# Patient Record
Sex: Female | Born: 1935 | Race: White | Hispanic: No | Smoking: Never smoker
Health system: Southern US, Community
[De-identification: ages and names within clinical notes are randomized; demographics above are authoritative.]

## PROBLEM LIST (undated history)

## (undated) DIAGNOSIS — K8071 Calculus of gallbladder and bile duct without cholecystitis with obstruction: Secondary | ICD-10-CM

## (undated) DIAGNOSIS — I509 Heart failure, unspecified: Secondary | ICD-10-CM

## (undated) DIAGNOSIS — Z9851 Tubal ligation status: Secondary | ICD-10-CM

## (undated) DIAGNOSIS — Z9049 Acquired absence of other specified parts of digestive tract: Secondary | ICD-10-CM

## (undated) DIAGNOSIS — J45909 Unspecified asthma, uncomplicated: Secondary | ICD-10-CM

## (undated) DIAGNOSIS — E162 Hypoglycemia, unspecified: Secondary | ICD-10-CM

## (undated) HISTORY — DX: Hypoglycemia, unspecified: E16.2

## (undated) HISTORY — PX: APPENDECTOMY: SHX54

## (undated) HISTORY — DX: Calculus of gallbladder and bile duct without cholecystitis with obstruction: K80.71

## (undated) HISTORY — PX: ABDOMINAL HYSTERECTOMY: SHX81

## (undated) HISTORY — PX: TUBAL LIGATION: SHX77

## (undated) HISTORY — DX: Heart failure, unspecified: I50.9

---

## 2006-06-09 ENCOUNTER — Emergency Department: Payer: Self-pay | Admitting: Internal Medicine

## 2006-06-09 ENCOUNTER — Other Ambulatory Visit: Payer: Self-pay

## 2006-06-10 ENCOUNTER — Ambulatory Visit: Payer: Self-pay | Admitting: Internal Medicine

## 2006-06-30 ENCOUNTER — Emergency Department: Payer: Self-pay | Admitting: Emergency Medicine

## 2006-06-30 ENCOUNTER — Other Ambulatory Visit: Payer: Self-pay

## 2010-05-24 ENCOUNTER — Emergency Department: Payer: Self-pay | Admitting: Emergency Medicine

## 2010-07-10 ENCOUNTER — Ambulatory Visit: Payer: Self-pay | Admitting: Internal Medicine

## 2010-11-23 ENCOUNTER — Emergency Department: Payer: Self-pay | Admitting: *Deleted

## 2010-12-15 ENCOUNTER — Ambulatory Visit: Payer: Self-pay | Admitting: Internal Medicine

## 2011-09-01 ENCOUNTER — Ambulatory Visit: Payer: Self-pay | Admitting: Family Medicine

## 2011-09-01 DIAGNOSIS — J069 Acute upper respiratory infection, unspecified: Secondary | ICD-10-CM | POA: Diagnosis not present

## 2011-09-01 DIAGNOSIS — R059 Cough, unspecified: Secondary | ICD-10-CM | POA: Diagnosis not present

## 2011-09-01 DIAGNOSIS — R05 Cough: Secondary | ICD-10-CM | POA: Diagnosis not present

## 2011-11-22 ENCOUNTER — Ambulatory Visit: Payer: Self-pay | Admitting: Internal Medicine

## 2011-11-22 DIAGNOSIS — J45909 Unspecified asthma, uncomplicated: Secondary | ICD-10-CM | POA: Diagnosis not present

## 2011-11-22 DIAGNOSIS — J40 Bronchitis, not specified as acute or chronic: Secondary | ICD-10-CM | POA: Diagnosis not present

## 2011-11-22 LAB — RAPID STREP-A WITH REFLX: Micro Text Report: NEGATIVE

## 2011-12-24 DIAGNOSIS — Z23 Encounter for immunization: Secondary | ICD-10-CM | POA: Diagnosis not present

## 2012-03-06 ENCOUNTER — Ambulatory Visit: Payer: Self-pay | Admitting: Emergency Medicine

## 2012-03-06 DIAGNOSIS — R918 Other nonspecific abnormal finding of lung field: Secondary | ICD-10-CM | POA: Diagnosis not present

## 2012-03-06 DIAGNOSIS — J45909 Unspecified asthma, uncomplicated: Secondary | ICD-10-CM | POA: Diagnosis not present

## 2012-06-23 ENCOUNTER — Ambulatory Visit: Payer: Self-pay | Admitting: Emergency Medicine

## 2012-06-23 DIAGNOSIS — J40 Bronchitis, not specified as acute or chronic: Secondary | ICD-10-CM | POA: Diagnosis not present

## 2012-06-23 DIAGNOSIS — J069 Acute upper respiratory infection, unspecified: Secondary | ICD-10-CM | POA: Diagnosis not present

## 2012-09-29 ENCOUNTER — Ambulatory Visit: Payer: Self-pay | Admitting: Emergency Medicine

## 2012-09-29 DIAGNOSIS — R0989 Other specified symptoms and signs involving the circulatory and respiratory systems: Secondary | ICD-10-CM | POA: Diagnosis not present

## 2012-09-29 DIAGNOSIS — E162 Hypoglycemia, unspecified: Secondary | ICD-10-CM | POA: Diagnosis not present

## 2012-09-29 DIAGNOSIS — R059 Cough, unspecified: Secondary | ICD-10-CM | POA: Diagnosis not present

## 2012-09-29 DIAGNOSIS — J40 Bronchitis, not specified as acute or chronic: Secondary | ICD-10-CM | POA: Diagnosis not present

## 2012-09-29 DIAGNOSIS — R05 Cough: Secondary | ICD-10-CM | POA: Diagnosis not present

## 2012-09-29 DIAGNOSIS — J45909 Unspecified asthma, uncomplicated: Secondary | ICD-10-CM | POA: Diagnosis not present

## 2012-10-13 DIAGNOSIS — N289 Disorder of kidney and ureter, unspecified: Secondary | ICD-10-CM | POA: Diagnosis not present

## 2012-10-13 DIAGNOSIS — J45909 Unspecified asthma, uncomplicated: Secondary | ICD-10-CM | POA: Diagnosis not present

## 2012-11-22 DIAGNOSIS — R0789 Other chest pain: Secondary | ICD-10-CM | POA: Diagnosis not present

## 2012-11-22 DIAGNOSIS — N289 Disorder of kidney and ureter, unspecified: Secondary | ICD-10-CM | POA: Diagnosis not present

## 2012-11-22 DIAGNOSIS — J45909 Unspecified asthma, uncomplicated: Secondary | ICD-10-CM | POA: Diagnosis not present

## 2012-11-22 DIAGNOSIS — Z0000 Encounter for general adult medical examination without abnormal findings: Secondary | ICD-10-CM | POA: Diagnosis not present

## 2012-11-22 DIAGNOSIS — E785 Hyperlipidemia, unspecified: Secondary | ICD-10-CM | POA: Diagnosis not present

## 2012-12-28 DIAGNOSIS — L821 Other seborrheic keratosis: Secondary | ICD-10-CM | POA: Diagnosis not present

## 2012-12-28 DIAGNOSIS — L570 Actinic keratosis: Secondary | ICD-10-CM | POA: Diagnosis not present

## 2012-12-28 DIAGNOSIS — D1801 Hemangioma of skin and subcutaneous tissue: Secondary | ICD-10-CM | POA: Diagnosis not present

## 2013-01-03 DIAGNOSIS — Z23 Encounter for immunization: Secondary | ICD-10-CM | POA: Diagnosis not present

## 2013-03-11 ENCOUNTER — Ambulatory Visit: Payer: Self-pay | Admitting: Physician Assistant

## 2013-03-11 DIAGNOSIS — R059 Cough, unspecified: Secondary | ICD-10-CM | POA: Diagnosis not present

## 2013-03-11 DIAGNOSIS — J40 Bronchitis, not specified as acute or chronic: Secondary | ICD-10-CM | POA: Diagnosis not present

## 2013-03-11 DIAGNOSIS — J45909 Unspecified asthma, uncomplicated: Secondary | ICD-10-CM | POA: Diagnosis not present

## 2013-03-11 DIAGNOSIS — Z90710 Acquired absence of both cervix and uterus: Secondary | ICD-10-CM | POA: Diagnosis not present

## 2013-03-11 DIAGNOSIS — Z9851 Tubal ligation status: Secondary | ICD-10-CM | POA: Diagnosis not present

## 2013-03-11 DIAGNOSIS — R05 Cough: Secondary | ICD-10-CM | POA: Diagnosis not present

## 2013-03-11 DIAGNOSIS — E162 Hypoglycemia, unspecified: Secondary | ICD-10-CM | POA: Diagnosis not present

## 2013-03-11 LAB — RAPID INFLUENZA A&B ANTIGENS (ARMC ONLY)

## 2013-03-22 ENCOUNTER — Ambulatory Visit: Payer: Self-pay | Admitting: Physician Assistant

## 2013-03-22 DIAGNOSIS — R059 Cough, unspecified: Secondary | ICD-10-CM | POA: Diagnosis not present

## 2013-03-22 DIAGNOSIS — F0390 Unspecified dementia without behavioral disturbance: Secondary | ICD-10-CM | POA: Diagnosis not present

## 2013-03-22 DIAGNOSIS — Z9851 Tubal ligation status: Secondary | ICD-10-CM | POA: Diagnosis not present

## 2013-03-22 DIAGNOSIS — R05 Cough: Secondary | ICD-10-CM | POA: Diagnosis not present

## 2013-03-22 DIAGNOSIS — Z79899 Other long term (current) drug therapy: Secondary | ICD-10-CM | POA: Diagnosis not present

## 2013-03-22 DIAGNOSIS — J069 Acute upper respiratory infection, unspecified: Secondary | ICD-10-CM | POA: Diagnosis not present

## 2013-03-22 DIAGNOSIS — Z90710 Acquired absence of both cervix and uterus: Secondary | ICD-10-CM | POA: Diagnosis not present

## 2013-03-22 DIAGNOSIS — E162 Hypoglycemia, unspecified: Secondary | ICD-10-CM | POA: Diagnosis not present

## 2013-03-22 DIAGNOSIS — J45909 Unspecified asthma, uncomplicated: Secondary | ICD-10-CM | POA: Diagnosis not present

## 2013-06-21 DIAGNOSIS — R300 Dysuria: Secondary | ICD-10-CM | POA: Diagnosis not present

## 2013-06-21 DIAGNOSIS — R42 Dizziness and giddiness: Secondary | ICD-10-CM | POA: Diagnosis not present

## 2013-07-04 DIAGNOSIS — J209 Acute bronchitis, unspecified: Secondary | ICD-10-CM | POA: Diagnosis not present

## 2013-10-18 ENCOUNTER — Ambulatory Visit: Payer: Self-pay | Admitting: Physician Assistant

## 2013-10-18 DIAGNOSIS — R05 Cough: Secondary | ICD-10-CM | POA: Diagnosis not present

## 2013-10-18 DIAGNOSIS — R059 Cough, unspecified: Secondary | ICD-10-CM | POA: Diagnosis not present

## 2013-10-18 DIAGNOSIS — J45909 Unspecified asthma, uncomplicated: Secondary | ICD-10-CM | POA: Diagnosis not present

## 2013-10-18 LAB — RAPID STREP-A WITH REFLX: Micro Text Report: NEGATIVE

## 2013-10-20 LAB — BETA STREP CULTURE(ARMC)

## 2013-10-22 DIAGNOSIS — J45909 Unspecified asthma, uncomplicated: Secondary | ICD-10-CM | POA: Diagnosis not present

## 2013-10-22 DIAGNOSIS — J40 Bronchitis, not specified as acute or chronic: Secondary | ICD-10-CM | POA: Diagnosis not present

## 2013-11-26 DIAGNOSIS — N3000 Acute cystitis without hematuria: Secondary | ICD-10-CM | POA: Diagnosis not present

## 2013-11-26 DIAGNOSIS — R05 Cough: Secondary | ICD-10-CM | POA: Diagnosis not present

## 2013-11-26 DIAGNOSIS — R059 Cough, unspecified: Secondary | ICD-10-CM | POA: Diagnosis not present

## 2013-12-06 ENCOUNTER — Emergency Department: Payer: Self-pay | Admitting: Emergency Medicine

## 2013-12-06 DIAGNOSIS — R0602 Shortness of breath: Secondary | ICD-10-CM | POA: Diagnosis not present

## 2013-12-06 DIAGNOSIS — R918 Other nonspecific abnormal finding of lung field: Secondary | ICD-10-CM | POA: Diagnosis not present

## 2013-12-06 DIAGNOSIS — R05 Cough: Secondary | ICD-10-CM | POA: Diagnosis not present

## 2013-12-06 DIAGNOSIS — J209 Acute bronchitis, unspecified: Secondary | ICD-10-CM | POA: Diagnosis not present

## 2013-12-06 LAB — CBC
HCT: 46.9 % (ref 35.0–47.0)
HGB: 15.4 g/dL (ref 12.0–16.0)
MCH: 30.2 pg (ref 26.0–34.0)
MCHC: 32.9 g/dL (ref 32.0–36.0)
MCV: 92 fL (ref 80–100)
Platelet: 217 10*3/uL (ref 150–440)
RBC: 5.1 10*6/uL (ref 3.80–5.20)
RDW: 13 % (ref 11.5–14.5)
WBC: 8.1 10*3/uL (ref 3.6–11.0)

## 2013-12-06 LAB — BASIC METABOLIC PANEL
Anion Gap: 8 (ref 7–16)
BUN: 16 mg/dL (ref 7–18)
CALCIUM: 9.1 mg/dL (ref 8.5–10.1)
CHLORIDE: 103 mmol/L (ref 98–107)
Co2: 27 mmol/L (ref 21–32)
Creatinine: 0.73 mg/dL (ref 0.60–1.30)
EGFR (African American): >60
Glucose: 101 mg/dL — ABNORMAL HIGH (ref 65–99)
OSMOLALITY: 277 (ref 275–301)
Potassium: 3.7 mmol/L (ref 3.5–5.1)
SODIUM: 138 mmol/L (ref 136–145)

## 2013-12-06 LAB — TROPONIN I: Troponin-I: <0.02 ng/mL

## 2013-12-06 LAB — PRO B NATRIURETIC PEPTIDE: B-TYPE NATIURETIC PEPTID: 431 pg/mL (ref 0–450)

## 2013-12-08 ENCOUNTER — Ambulatory Visit: Payer: Self-pay | Admitting: Internal Medicine

## 2013-12-08 DIAGNOSIS — J441 Chronic obstructive pulmonary disease with (acute) exacerbation: Secondary | ICD-10-CM | POA: Diagnosis not present

## 2013-12-08 DIAGNOSIS — Z79899 Other long term (current) drug therapy: Secondary | ICD-10-CM | POA: Diagnosis not present

## 2013-12-08 DIAGNOSIS — J45901 Unspecified asthma with (acute) exacerbation: Secondary | ICD-10-CM | POA: Diagnosis not present

## 2013-12-08 DIAGNOSIS — J439 Emphysema, unspecified: Secondary | ICD-10-CM | POA: Diagnosis not present

## 2013-12-10 ENCOUNTER — Inpatient Hospital Stay: Payer: Self-pay | Admitting: Internal Medicine

## 2013-12-10 DIAGNOSIS — J4521 Mild intermittent asthma with (acute) exacerbation: Secondary | ICD-10-CM | POA: Diagnosis not present

## 2013-12-10 DIAGNOSIS — Z8673 Personal history of transient ischemic attack (TIA), and cerebral infarction without residual deficits: Secondary | ICD-10-CM | POA: Diagnosis not present

## 2013-12-10 DIAGNOSIS — R918 Other nonspecific abnormal finding of lung field: Secondary | ICD-10-CM | POA: Diagnosis not present

## 2013-12-10 DIAGNOSIS — I517 Cardiomegaly: Secondary | ICD-10-CM | POA: Diagnosis not present

## 2013-12-10 DIAGNOSIS — R05 Cough: Secondary | ICD-10-CM | POA: Diagnosis not present

## 2013-12-10 DIAGNOSIS — Z888 Allergy status to other drugs, medicaments and biological substances status: Secondary | ICD-10-CM | POA: Diagnosis not present

## 2013-12-10 DIAGNOSIS — J189 Pneumonia, unspecified organism: Secondary | ICD-10-CM | POA: Diagnosis not present

## 2013-12-10 DIAGNOSIS — J441 Chronic obstructive pulmonary disease with (acute) exacerbation: Secondary | ICD-10-CM | POA: Diagnosis not present

## 2013-12-10 DIAGNOSIS — J440 Chronic obstructive pulmonary disease with acute lower respiratory infection: Secondary | ICD-10-CM | POA: Diagnosis present

## 2013-12-10 DIAGNOSIS — R0602 Shortness of breath: Secondary | ICD-10-CM | POA: Diagnosis not present

## 2013-12-10 DIAGNOSIS — J9601 Acute respiratory failure with hypoxia: Secondary | ICD-10-CM | POA: Diagnosis not present

## 2013-12-10 DIAGNOSIS — J45909 Unspecified asthma, uncomplicated: Secondary | ICD-10-CM | POA: Diagnosis present

## 2013-12-10 DIAGNOSIS — J9600 Acute respiratory failure, unspecified whether with hypoxia or hypercapnia: Secondary | ICD-10-CM | POA: Diagnosis not present

## 2013-12-10 DIAGNOSIS — M6281 Muscle weakness (generalized): Secondary | ICD-10-CM | POA: Diagnosis not present

## 2013-12-10 DIAGNOSIS — T380X5A Adverse effect of glucocorticoids and synthetic analogues, initial encounter: Secondary | ICD-10-CM | POA: Diagnosis present

## 2013-12-10 LAB — CBC WITH DIFFERENTIAL/PLATELET
Basophil #: 0.1 10*3/uL (ref 0.0–0.1)
Basophil %: 1.1 %
EOS ABS: 0.2 10*3/uL (ref 0.0–0.7)
Eosinophil %: 2.8 %
HCT: 44.1 % (ref 35.0–47.0)
HGB: 14.9 g/dL (ref 12.0–16.0)
LYMPHS ABS: 2.4 10*3/uL (ref 1.0–3.6)
Lymphocyte %: 41.8 %
MCH: 30.8 pg (ref 26.0–34.0)
MCHC: 33.8 g/dL (ref 32.0–36.0)
MCV: 91 fL (ref 80–100)
MONO ABS: 0.5 x10 3/mm (ref 0.2–0.9)
MONOS PCT: 8.8 %
NEUTROS ABS: 2.6 10*3/uL (ref 1.4–6.5)
Neutrophil %: 45.5 %
Platelet: 182 10*3/uL (ref 150–440)
RBC: 4.84 10*6/uL (ref 3.80–5.20)
RDW: 12.8 % (ref 11.5–14.5)
WBC: 5.8 10*3/uL (ref 3.6–11.0)

## 2013-12-10 LAB — URINALYSIS, COMPLETE
BACTERIA: NONE SEEN
BILIRUBIN, UR: NEGATIVE
BLOOD: NEGATIVE
GLUCOSE, UR: NEGATIVE mg/dL (ref 0–75)
Ketone: NEGATIVE
Leukocyte Esterase: NEGATIVE
Nitrite: NEGATIVE
Ph: 7 (ref 4.5–8.0)
Protein: NEGATIVE
RBC,UR: <1 /HPF (ref 0–5)
SPECIFIC GRAVITY: 1.006 (ref 1.003–1.030)
Squamous Epithelial: <1

## 2013-12-10 LAB — BASIC METABOLIC PANEL
Anion Gap: 3 — ABNORMAL LOW (ref 7–16)
BUN: 15 mg/dL (ref 7–18)
CALCIUM: 8.9 mg/dL (ref 8.5–10.1)
Chloride: 104 mmol/L (ref 98–107)
Co2: 33 mmol/L — ABNORMAL HIGH (ref 21–32)
Creatinine: 0.76 mg/dL (ref 0.60–1.30)
EGFR (African American): >60
EGFR (Non-African Amer.): >60
GLUCOSE: 97 mg/dL (ref 65–99)
Osmolality: 280 (ref 275–301)
Potassium: 3.7 mmol/L (ref 3.5–5.1)
Sodium: 140 mmol/L (ref 136–145)

## 2013-12-10 LAB — HEPATIC FUNCTION PANEL A (ARMC)
ALBUMIN: 3.7 g/dL (ref 3.4–5.0)
Alkaline Phosphatase: 70 U/L
BILIRUBIN DIRECT: 0.2 mg/dL (ref 0.00–0.20)
Bilirubin,Total: 0.6 mg/dL (ref 0.2–1.0)
SGOT(AST): 29 U/L (ref 15–37)
SGPT (ALT): 30 U/L
Total Protein: 7.2 g/dL (ref 6.4–8.2)

## 2013-12-11 DIAGNOSIS — J9601 Acute respiratory failure with hypoxia: Secondary | ICD-10-CM | POA: Diagnosis not present

## 2013-12-11 DIAGNOSIS — J189 Pneumonia, unspecified organism: Secondary | ICD-10-CM | POA: Diagnosis not present

## 2013-12-11 DIAGNOSIS — J4521 Mild intermittent asthma with (acute) exacerbation: Secondary | ICD-10-CM | POA: Diagnosis not present

## 2013-12-11 LAB — CBC WITH DIFFERENTIAL/PLATELET
Basophil #: 0 10*3/uL (ref 0.0–0.1)
Basophil %: 0.5 %
EOS PCT: 1.4 %
Eosinophil #: 0.1 10*3/uL (ref 0.0–0.7)
HCT: 41.5 % (ref 35.0–47.0)
HGB: 13.6 g/dL (ref 12.0–16.0)
LYMPHS ABS: 3.1 10*3/uL (ref 1.0–3.6)
LYMPHS PCT: 47 %
MCH: 30.2 pg (ref 26.0–34.0)
MCHC: 32.7 g/dL (ref 32.0–36.0)
MCV: 92 fL (ref 80–100)
MONO ABS: 0.6 x10 3/mm (ref 0.2–0.9)
Monocyte %: 9.1 %
NEUTROS PCT: 42 %
Neutrophil #: 2.8 10*3/uL (ref 1.4–6.5)
Platelet: 178 10*3/uL (ref 150–440)
RBC: 4.49 10*6/uL (ref 3.80–5.20)
RDW: 12.8 % (ref 11.5–14.5)
WBC: 6.6 10*3/uL (ref 3.6–11.0)

## 2013-12-11 LAB — COMPREHENSIVE METABOLIC PANEL
ALBUMIN: 3.4 g/dL (ref 3.4–5.0)
ANION GAP: 5 — AB (ref 7–16)
AST: 18 U/L (ref 15–37)
Alkaline Phosphatase: 61 U/L
BUN: 18 mg/dL (ref 7–18)
Bilirubin,Total: 0.7 mg/dL (ref 0.2–1.0)
CREATININE: 0.91 mg/dL (ref 0.60–1.30)
Calcium, Total: 8.2 mg/dL — ABNORMAL LOW (ref 8.5–10.1)
Chloride: 102 mmol/L (ref 98–107)
Co2: 33 mmol/L — ABNORMAL HIGH (ref 21–32)
Glucose: 87 mg/dL (ref 65–99)
OSMOLALITY: 281 (ref 275–301)
POTASSIUM: 3.7 mmol/L (ref 3.5–5.1)
SGPT (ALT): 24 U/L
SODIUM: 140 mmol/L (ref 136–145)
TOTAL PROTEIN: 6.6 g/dL (ref 6.4–8.2)

## 2013-12-12 DIAGNOSIS — J9601 Acute respiratory failure with hypoxia: Secondary | ICD-10-CM | POA: Diagnosis not present

## 2013-12-12 DIAGNOSIS — J45909 Unspecified asthma, uncomplicated: Secondary | ICD-10-CM | POA: Diagnosis not present

## 2013-12-12 DIAGNOSIS — Z888 Allergy status to other drugs, medicaments and biological substances status: Secondary | ICD-10-CM | POA: Diagnosis not present

## 2013-12-12 DIAGNOSIS — Z8673 Personal history of transient ischemic attack (TIA), and cerebral infarction without residual deficits: Secondary | ICD-10-CM | POA: Diagnosis not present

## 2013-12-12 DIAGNOSIS — J189 Pneumonia, unspecified organism: Secondary | ICD-10-CM | POA: Diagnosis not present

## 2013-12-12 DIAGNOSIS — R0902 Hypoxemia: Secondary | ICD-10-CM | POA: Diagnosis not present

## 2013-12-12 DIAGNOSIS — Z8701 Personal history of pneumonia (recurrent): Secondary | ICD-10-CM | POA: Diagnosis not present

## 2013-12-12 DIAGNOSIS — Z801 Family history of malignant neoplasm of trachea, bronchus and lung: Secondary | ICD-10-CM | POA: Diagnosis not present

## 2013-12-12 LAB — CBC
HCT: 46.2 % (ref 35.0–47.0)
HGB: 15.3 g/dL (ref 12.0–16.0)
MCH: 30.8 pg (ref 26.0–34.0)
MCHC: 33.2 g/dL (ref 32.0–36.0)
MCV: 93 fL (ref 80–100)
Platelet: 185 10*3/uL (ref 150–440)
RBC: 4.98 10*6/uL (ref 3.80–5.20)
RDW: 13 % (ref 11.5–14.5)
WBC: 6.8 10*3/uL (ref 3.6–11.0)

## 2013-12-12 LAB — COMPREHENSIVE METABOLIC PANEL
ALBUMIN: 3.7 g/dL (ref 3.4–5.0)
ALK PHOS: 69 U/L
AST: 20 U/L (ref 15–37)
Anion Gap: 5 — ABNORMAL LOW (ref 7–16)
BUN: 17 mg/dL (ref 7–18)
Bilirubin,Total: 0.6 mg/dL (ref 0.2–1.0)
CHLORIDE: 104 mmol/L (ref 98–107)
CO2: 32 mmol/L (ref 21–32)
Calcium, Total: 9.1 mg/dL (ref 8.5–10.1)
Creatinine: 0.81 mg/dL (ref 0.60–1.30)
EGFR (African American): >60
EGFR (Non-African Amer.): >60
Glucose: 109 mg/dL — ABNORMAL HIGH (ref 65–99)
Osmolality: 283 (ref 275–301)
POTASSIUM: 3.6 mmol/L (ref 3.5–5.1)
SGPT (ALT): 25 U/L
Sodium: 141 mmol/L (ref 136–145)
Total Protein: 7.5 g/dL (ref 6.4–8.2)

## 2013-12-12 LAB — TROPONIN I: Troponin-I: <0.02 ng/mL

## 2013-12-12 LAB — URINE CULTURE

## 2013-12-13 ENCOUNTER — Observation Stay: Payer: Self-pay | Admitting: Internal Medicine

## 2013-12-13 DIAGNOSIS — J9600 Acute respiratory failure, unspecified whether with hypoxia or hypercapnia: Secondary | ICD-10-CM | POA: Diagnosis not present

## 2013-12-13 DIAGNOSIS — J189 Pneumonia, unspecified organism: Secondary | ICD-10-CM | POA: Diagnosis not present

## 2013-12-13 DIAGNOSIS — J45901 Unspecified asthma with (acute) exacerbation: Secondary | ICD-10-CM | POA: Diagnosis not present

## 2013-12-13 DIAGNOSIS — J45990 Exercise induced bronchospasm: Secondary | ICD-10-CM | POA: Diagnosis not present

## 2013-12-15 LAB — CULTURE, BLOOD (SINGLE)

## 2013-12-21 DIAGNOSIS — N289 Disorder of kidney and ureter, unspecified: Secondary | ICD-10-CM | POA: Diagnosis not present

## 2013-12-21 DIAGNOSIS — J189 Pneumonia, unspecified organism: Secondary | ICD-10-CM | POA: Diagnosis not present

## 2014-01-03 DIAGNOSIS — Z23 Encounter for immunization: Secondary | ICD-10-CM | POA: Diagnosis not present

## 2014-03-03 ENCOUNTER — Emergency Department: Payer: Self-pay | Admitting: Emergency Medicine

## 2014-03-03 DIAGNOSIS — J209 Acute bronchitis, unspecified: Secondary | ICD-10-CM | POA: Diagnosis not present

## 2014-03-03 DIAGNOSIS — J40 Bronchitis, not specified as acute or chronic: Secondary | ICD-10-CM | POA: Diagnosis not present

## 2014-03-03 DIAGNOSIS — R0602 Shortness of breath: Secondary | ICD-10-CM | POA: Diagnosis not present

## 2014-03-03 DIAGNOSIS — J441 Chronic obstructive pulmonary disease with (acute) exacerbation: Secondary | ICD-10-CM | POA: Diagnosis not present

## 2014-03-03 LAB — BASIC METABOLIC PANEL
ANION GAP: 6 — AB (ref 7–16)
BUN: 19 mg/dL — ABNORMAL HIGH (ref 7–18)
CHLORIDE: 105 mmol/L (ref 98–107)
Calcium, Total: 8.7 mg/dL (ref 8.5–10.1)
Co2: 30 mmol/L (ref 21–32)
Creatinine: 0.83 mg/dL (ref 0.60–1.30)
EGFR (African American): >60
EGFR (Non-African Amer.): >60
Glucose: 89 mg/dL (ref 65–99)
Osmolality: 283 (ref 275–301)
Potassium: 3.4 mmol/L — ABNORMAL LOW (ref 3.5–5.1)
Sodium: 141 mmol/L (ref 136–145)

## 2014-03-03 LAB — CBC WITH DIFFERENTIAL/PLATELET
Basophil #: 0 10*3/uL (ref 0.0–0.1)
Basophil %: 0.5 %
EOS ABS: 0.1 10*3/uL (ref 0.0–0.7)
EOS PCT: 1.4 %
HCT: 43.5 % (ref 35.0–47.0)
HGB: 14.4 g/dL (ref 12.0–16.0)
LYMPHS PCT: 39.3 %
Lymphocyte #: 2.7 10*3/uL (ref 1.0–3.6)
MCH: 30.7 pg (ref 26.0–34.0)
MCHC: 33.2 g/dL (ref 32.0–36.0)
MCV: 92 fL (ref 80–100)
Monocyte #: 0.7 x10 3/mm (ref 0.2–0.9)
Monocyte %: 10.5 %
Neutrophil #: 3.3 10*3/uL (ref 1.4–6.5)
Neutrophil %: 48.3 %
Platelet: 161 10*3/uL (ref 150–440)
RBC: 4.71 10*6/uL (ref 3.80–5.20)
RDW: 13.3 % (ref 11.5–14.5)
WBC: 6.8 10*3/uL (ref 3.6–11.0)

## 2014-03-03 LAB — TROPONIN I: Troponin-I: <0.02 ng/mL

## 2014-03-08 LAB — CULTURE, BLOOD (SINGLE)

## 2014-03-11 DIAGNOSIS — J209 Acute bronchitis, unspecified: Secondary | ICD-10-CM | POA: Diagnosis not present

## 2014-03-11 DIAGNOSIS — J4540 Moderate persistent asthma, uncomplicated: Secondary | ICD-10-CM | POA: Diagnosis not present

## 2014-05-13 DIAGNOSIS — N289 Disorder of kidney and ureter, unspecified: Secondary | ICD-10-CM | POA: Diagnosis not present

## 2014-05-13 DIAGNOSIS — Z111 Encounter for screening for respiratory tuberculosis: Secondary | ICD-10-CM | POA: Diagnosis not present

## 2014-05-13 DIAGNOSIS — J4520 Mild intermittent asthma, uncomplicated: Secondary | ICD-10-CM | POA: Diagnosis not present

## 2014-06-18 ENCOUNTER — Emergency Department
Admit: 2014-06-18 | Disposition: A | Discharge status: Home / Self Care | Payer: Self-pay | Admitting: Emergency Medicine

## 2014-06-18 DIAGNOSIS — R05 Cough: Secondary | ICD-10-CM | POA: Diagnosis not present

## 2014-06-18 DIAGNOSIS — R0602 Shortness of breath: Secondary | ICD-10-CM | POA: Diagnosis not present

## 2014-06-18 DIAGNOSIS — F419 Anxiety disorder, unspecified: Secondary | ICD-10-CM | POA: Diagnosis not present

## 2014-06-18 DIAGNOSIS — J441 Chronic obstructive pulmonary disease with (acute) exacerbation: Secondary | ICD-10-CM | POA: Diagnosis not present

## 2014-06-27 DIAGNOSIS — N289 Disorder of kidney and ureter, unspecified: Secondary | ICD-10-CM | POA: Diagnosis not present

## 2014-06-27 DIAGNOSIS — J4520 Mild intermittent asthma, uncomplicated: Secondary | ICD-10-CM | POA: Diagnosis not present

## 2014-06-27 DIAGNOSIS — I700 Atherosclerosis of aorta: Secondary | ICD-10-CM | POA: Diagnosis not present

## 2014-06-27 DIAGNOSIS — Z0000 Encounter for general adult medical examination without abnormal findings: Secondary | ICD-10-CM | POA: Diagnosis not present

## 2014-06-27 DIAGNOSIS — N63 Unspecified lump in breast: Secondary | ICD-10-CM | POA: Diagnosis not present

## 2014-06-27 DIAGNOSIS — J4530 Mild persistent asthma, uncomplicated: Secondary | ICD-10-CM | POA: Diagnosis not present

## 2014-06-27 LAB — LIPID PANEL
Cholesterol: 215 mg/dL — AB (ref 0–200)
HDL: 56 mg/dL (ref 35–70)
LDL Cholesterol: 139 mg/dL
Triglycerides: 100 mg/dL (ref 40–160)

## 2014-06-27 LAB — TSH: TSH: 0.82 u[IU]/mL (ref <=5.90)

## 2014-06-27 LAB — BASIC METABOLIC PANEL
BUN: 13 mg/dL (ref 4–21)
Creatinine: 0.7 mg/dL (ref <=1.1)

## 2014-06-27 LAB — CBC AND DIFFERENTIAL: Hemoglobin: 15.5 g/dL (ref 12.0–16.0)

## 2014-06-28 ENCOUNTER — Other Ambulatory Visit: Payer: Self-pay | Admitting: Internal Medicine

## 2014-06-28 DIAGNOSIS — R928 Other abnormal and inconclusive findings on diagnostic imaging of breast: Secondary | ICD-10-CM

## 2014-06-28 DIAGNOSIS — N63 Unspecified lump in unspecified breast: Secondary | ICD-10-CM

## 2014-06-29 NOTE — Discharge Summary (Signed)
PATIENT NAME:  Allison Snyder, Allison Snyder MR#:  [id number] DATE OF BIRTH:  June 05, 1929  DATE OF ADMISSION:  12/10/2013 DATE OF DISCHARGE:  12/11/2013  PRIMARY CARE PHYSICIAN:  Dr. Halina Maidens.   FINAL DIAGNOSES:  1.  Acute respiratory failure which resolved.  2.  Pneumonia.  3.  Reactive airway disease.   MEDICATIONS ON DISCHARGE: Include Levaquin 500 mg every 24 hours for 8 more days, Qvar 40 mcg per inhalation 1 puff twice a day, albuterol nebulizer 3 mL every 6 hours as needed for shortness of breath.   DIET:  Regular, regular consistency.   ACTIVITY: As tolerated.   FOLLOWUP:  In 1-2 weeks with Dr. Halina Maidens.   HOSPITAL COURSE: The patient was admitted 12/10/2013 and discharged 12/11/2013, came in with shortness of breath and cough, found to have a lower lobe pneumonia, was hypoxic on room air, pulse oximetry in the mid 80s. The patient was started on Levaquin and nebulizer treatments.   LABORATORY AND RADIOLOGICAL DATA DURING THE HOSPITAL COURSE: Included an EKG that showed normal sinus rhythm, right bundle branch. BNP 431. Glucose 101, BUN 16, creatinine 0.73, sodium 138, potassium 3.7, chloride 103, CO2 of 27. Troponin negative. White blood cell count 8.1, H and H 15.4 and 46.9, platelet count of 217,000. Chest x-ray showed hyperinflation of the lungs consistent with chronic obstructive pulmonary disease, stable bilateral basal subsegmental atelectasis and scarring is noted, those were done prior to admission. On the day of admission EKG showed sinus rhythm, premature atrial complexes, right bundle branch block. Liver function tests were normal. White blood cell count 5.8, H and H  14.9 and 44.1, platelet count of 182,000. Glucose 97, BUN 15, creatinine 0.76, sodium 140, potassium 3.7, chloride 104, CO2 of 33, calcium 8.9. Blood cultures were negative. Chest x-ray showed a left basilar airspace opacity, may reflect mild pneumonia, a small left pleural effusion noted, mild cardiomegaly. Urine  culture negative. Urinalysis negative. White blood cell count upon discharge 6.6, hemoglobin 13.6, platelet count 178,000. Creatinine 0.91.   HOSPITAL COURSE PER PROBLEM LIST:  1.  For the patient's acute respiratory failure, this had resolved. Initially requiring oxygen coming into the hospital, the patient was off the oxygen and saturating well above 90%.  2.  Pneumonia. The patient was started on Levaquin, will complete a full course of Levaquin.  3.  Reactive airway disease. The patient has an allergy to prednisone, thinks she had a stroke from prednisone in the past. I convinced her to do budesonide nebulizers while here because she had a lot of wheezing on October 5. On October 6 there was no wheezing after budesonide nebulizers were given. She was given Qvar as an outpatient. As per family has the same situation every year around the change in season, Qvar will help prevent that, if she does take it before the weather starts changing hopefully we can avoid bronchitis and hospitalization.   TIME SPENT ON DISCHARGE: 35 minutes.    ____________________________ Tana Conch. Leslye Peer, MD rjw:bu D: 12/11/2013 13:44:23 ET T: 12/11/2013 14:05:16 ET JOB#: [id number]  cc: Tana Conch. Leslye Peer, MD, <Dictator> Halina Maidens, MD Marisue Brooklyn MD ELECTRONICALLY SIGNED 12/23/2013 12:48

## 2014-06-29 NOTE — H&P (Signed)
PATIENT NAME:  Allison Snyder, Allison Snyder MR#:  [id number] DATE OF BIRTH:  1929-10-27  DATE OF ADMISSION:  12/10/2013  PRIMARY CARE PHYSICIAN: Halina Maidens, MD  REFERRING PHYSICIAN: Dr. Dahlia Client  ADMITTING PHYSICIAN: Azucena Freed, MD  CHIEF COMPLAINT:  1.  Shortness of breath. 2.  Cough for the past few days.  HISTORY OF PRESENT ILLNESS: An 81 year old Caucasian woman with a past medical history of asthma, not on any home medications regularly, history of questionable TIA secondary to prednisone in the remote past, presents to the Emergency Room with the complaint of ongoing cough with shortness of breath for the past few days. The patient states that she has been having some cough with some shortness of breath on and off for the past few days and also was diagnosed to have some UTI for which she has seen her primary care doctor and was given oral antibiotics and presumably it was Zithromax. She took that but did not have any relief of the cough and shortness of breath. She continued to have the shortness of breath and cough, hence came to the Emergency Room for further evaluation. No history of any fever, no history of any chills, no chest pain, no dizziness, no palpitations, and no nausea, vomiting, diarrhea, constipation or abdominal pain. She denies any urinary symptoms at this time. In the Emergency Room, the patient on arrival was found to be hypoxic with O2 saturations running in the mid 80s and she received oxygen supplementation through nasal cannula, 2 liters, following which her oxygenation improved and currently oxygen saturation is in the upper 90s with 2 liters oxygen supplementation. Further workup in the Emergency Room revealed normal lab work, but her chest x-ray was positive for left basilar airspace opacity which may reflect mild pneumonia associated with small left pleural effusion. Hence, hospitalist service was consulted for further management and treatment. The patient states now she is  doing better with oxygen supplementation. Blood cultures were done and the patient was started on IV Levaquin in the Emergency Room.   PAST MEDICAL HISTORY: She reports history of asthma, but she says she does not take any medications on a regular basis. She was advised to take nebulizers/inhalers, but she refused to take the medications and takes only occasionally.   She does give a history of TIA attributable because she is allergic to prednisone which she took many years ago following which she had a mini stroke. No history of any hypertension, diabetes, coronary artery disease.  PAST SURGICAL HISTORY: Tubal ligation, hysterectomy.  HOME MEDICATIONS: None.  ALLERGIES: PREDNISONE; she claims it caused her TIA.   SOCIAL HISTORY: She is widowed, retired, lives with her son. No history of smoking, alcohol or drug abuse.  FAMILY HISTORY: Mother died with lung cancer. Father died with bleeding ulcers and pulmonary tuberculosis.  REVIEW OF SYSTEMS: CONSTITUTIONAL: Denies any fever, fatigue, generalized weakness. No abnormal weight gain or weight loss. EYES: Denies blurred vision or double vision. No redness. No inflammation. HENT: Negative for tinnitus, ear pain, hearing loss, epistaxis, nasal discharge, or oropharyngeal redness. RESPIRATORY: Does have some cough with shortness of breath, ongoing for the past few days. Cough essentially dry with minimal sputum. Denies any hemoptysis. Denies any wheezing. Denies any painful respirations.  CARDIOVASCULAR: No chest pain, orthopnea, pedal edema, palpitations, syncope. GASTROINTESTINAL: Negative for nausea, vomiting, diarrhea, abdominal pain, hematemesis, melena, GERD symptoms or rectal bleeding. GENITOURINARY: She gives a history of UTI, about 10 days ago, for which she has seen her primary care doctor  and used some oral antibiotics with which I presume it is Zithromax. She currently denies any urinary symptoms.  ENDOCRINE: Negative for any polyuria,  polydipsia, heat or cold intolerance.  HEME: Negative for anemia, easy bruising, bleeding. SKIN: Negative for any skin rashes or lesions. MUSCULOSKELETAL: No arthritis. No swelling. No gout. NEUROLOGIC: Denies any focal weakness or numbness. She gives a history of TIA and she attributes that because she was allergic to prednisone, which happened many years ago.  PSYCHIATRIC: Denies any anxiety, insomnia or depression.  PHYSICAL EXAMINATION: VITAL SIGNS: Upon arrival to the Emergency Room, temperature 98.5, pulse rate 75, respirations 20, blood pressure 179/90, O2 saturations 85%. Current vitals: Pulse 71 per minute and regular, respirations 20, blood pressure 145/73, and O2 saturations 97% on 2 liters nasal cannula. GENERAL: Elderly lady, well-developed, well-nourished, alert, awake, in no acute distress.  HEAD: Atraumatic, normocephalic.  EYES: Pupils equal and reactive to light. No conjunctival pallor. No scleral icterus. Extraocular movements intact.  NOSE: No nasal lesions. No drainage. EARS: No drainage. No external lesions. ORAL CAVITY: No mucosal lesions. No exudates. NECK: Supple. No JVD. No thyromegaly. No carotid bruit. No cervical lymphadenopathy. Full range of motion without any pain. RESPIRATORY: Good respiratory effort. Rhonchi in the left base. Occasional wheezing present. CARDIOVASCULAR: Regular rate and rhythm. No murmurs appreciated. Peripheral pulses equal at carotid and femoral and pedal pulses good. No peripheral edema.  GASTROINTESTINAL: Abdomen soft, nontender. No hepatosplenomegaly. No rebound. No guarding. No rigidity. Bowel sounds equal in all 4 quadrants. GENITOURINARY: Deferred. MUSCULOSKELETAL: Gait not tested. Full range of motion in all joints. Strength and tone equal bilaterally. SKIN: Inspection within normal limits, well-hydrated. LYMPHATIC: Cervical lymph nodes negative.  VASCULAR: Good dorsalis pedis and posterior tibial pulses. NEUROLOGIC: Alert, awake and  oriented x3. Cranial nerves II through XII grossly intact. Deep tendon reflexes 2+ bilateral and symmetrical. Motor strength 4/4 bilateral upper and lower extremity.  PSYCHIATRIC: Judgment and affect adequate. Alert and oriented x3. Memory and mood within normal limits.   DIAGNOSTIC DATA: Serum glucose 97, BUN 15, creatinine 0.76, sodium 140, potassium 3.7, chloride 104, bicarb 33, total calcium 8.9. WBC 5.8, hemoglobin 14.9, hematocrit 44.1, platelet count 182,000.   Imaging studies:  Chest x-ray: 1.  Left basilar airspace opacity, may reflect mild pneumonia, small left pleural effusion noted. 2.  Mild cardiomegaly.  EKG: Normal sinus rhythm with premature atrial contractions, heart rate of 71 beats per minute. Right bundle branch block.   ASSESSMENT AND PLAN: An 81 year old Caucasian woman with a history of bronchial asthma, not on any home medications, presents with the complaint of ongoing cough and shortness of breath.  1.  Ongoing cough with shortness of breath for the past few days. Received oral antibiotics from a doctor's office with no improvement in the symptoms. Evaluated by the ED physician, found to be hypoxic with oxygen saturations in the mid 80's upon arrival to the Emergency Room. Chest x-ray significant for left lower lobe infiltrate suggesting early pneumonia and small pleural effusion. 2.  Left lower lobe pneumonia, community-acquired. Admit to med/surg. Blood cultures. Start Levaquin. DuoNebs q. 4 hours while awake. Sputum for culture and sensitivity.  3.  Acute hypoxic respiratory failure upon arrival to the Emergency Room, secondary due to left lower lobe pneumonia. Received oxygen supplementation through nasal cannula. Oxygen saturations maintaining in the mid 90s with oxygen supplementation at present, patient stable. 4.  History of bronchial asthma. Does not take medications regularly at home. No prior hospitalizations for bronchial asthma. 5.  History of transient  ischemic attacks and the patient claims it was secondary due to prednisone usage in the remote past. No residual neurological deficit.   CODE STATUS: FULL code.  TIME SPENT: 55 minutes.  ____________________________ Juluis Mire, MD enr:sb D: 12/10/2013 07:17:11 ET T: 12/10/2013 08:55:10 ET JOB#: [id number]  cc: Juluis Mire, MD, <Dictator> Halina Maidens, MD Juluis Mire MD ELECTRONICALLY SIGNED 12/10/2013 13:11

## 2014-06-29 NOTE — H&P (Signed)
PATIENT NAME:  Allison Snyder, Allison Snyder MR#:  [id number] DATE OF BIRTH:  11/10/1929  DATE OF ADMISSION:  12/12/2013  REFERRING PHYSICIAN:  Brunilda Payor A. Edd Fabian, MD  PRIMARY CARE PHYSICIAN:   Halina Maidens, MD   CHIEF COMPLAINT:  Shortness of breath.   HISTORY OF PRESENT ILLNESS:  An 81 year old Caucasian female with a past medical history of asthma, as well as TIA without residual neurologic deficits, presenting with shortness of breath. She was recently evaluated at [facility] with discharge diagnosis of community-acquired pneumonia, discharged on Levaquin; however, she has a pill container for Macrobid, stating these were the antibiotics she was given; however, this is the wrong medication and the wrong date. Looking at her history, it seems that she was diagnosed with a UTI around October 1, and this might be the antibiotic from that episode. Regardless, after discharge she was at home, and her cough worsened, nonproductive with worsening shortness of breath. Because of the shortness of breath, she was sent to the hospital for further workup and evaluation. Denies any fevers, chills, or further symptomatology. Denies any chest pain. She was noted to be hypoxemic on arrival to the Emergency Department, saturating in the 80s on room air, however, such increased after receiving 2 to 3 liters nasal cannula.   REVIEW OF SYSTEMS: CONSTITUTIONAL:  Denies any fevers or chills; however, positive for fatigue and weakness.  EYES:  Denies blurred vision, double vision, or eye pain.  EARS, NOSE, AND THROAT:  Denies tinnitus, ear pain, or hearing loss. RESPIRATORY:  Positive for cough and shortness of breath as described above. CARDIOVASCULAR:  Denies chest pain, palpitations, or edema.  GASTROINTESTINAL:  Denies nausea, vomiting, diarrhea, or abdominal pain.  GENITOURINARY:  Denies dysuria or hematuria.  ENDOCRINE:  Denies nocturia or thyroid problems.  HEMATOLOGIC:  Denies easy bruising or bleeding. SKIN:  No  rashes or lesions.  MUSCULOSKELETAL:  Denies pain in neck, back, shoulder, knees, hips or arthritic symptoms.  NEUROLOGIC:  Denies paralysis or paresthesia.  PSYCHIATRIC:  Denies anxiety or depressive symptoms.  Otherwise, full review of systems performed by me is negative.  PAST MEDICAL HISTORY OF:  Asthma; however, she is on no medications for this. History of TIA without residual neurologic deficit and recent diagnosis of community-acquired pneumonia on Levaquin, though question if she actually has this medication at home or not.   SOCIAL HISTORY:  Denies any alcohol, tobacco, or drug use.   FAMILY HISTORY:  Positive for lung cancer.   ALLERGIES:  CORTISONE, PREDNISONE.   HOME MEDICATIONS INCLUDE:  Albuterol 3 mL inhalation every 6 hours as needed for shortness of breath, Levaquin 500 mg p.o. q.24 hours, Qvar 40 mcg inhalation 1 puff b.i.d.   PHYSICAL EXAMINATION: VITAL SIGNS:  Temperature 98.3, heart rate 91, respirations 24, blood pressure 141/70, saturating 87% on room air, saturating 97% on 2 liters nasal cannula. Weight 79.5 kg, BMI 29.1.  GENERAL:  Well-nourished, well-developed Caucasian female, alert, in no acute distress.  HEAD:  Normocephalic, atraumatic.  EYES:  Pupils are equal and reactive to light. Extraocular muscles are intact. No scleral icterus.  MOUTH:  Moist mucosal membranes. Dentition is intact. No abscess noted.  EARS, NOSE, AND THROAT:  Clear without exudates. No external lesions.  NECK: Supple. No thyromegaly or nodules. No JVD.  PULMONARY:  Diminished breath sounds at the bases bilaterally with coarse rhonchi; however, air entry is poor secondary to respiratory effort.  CHEST:  Nontender to palpation.  CARDIOVASCULAR:  S1, S2, regular rate and rhythm. No  murmurs, rubs, or gallops. No edema. Pedal pulses 2+ bilaterally.  GASTROINTESTINAL:  Soft, nontender, nondistended. No masses. Positive bowel sounds. No hepatosplenomegaly.  MUSCULOSKELETAL:  No swelling,  clubbing, or edema. Range of motion is full in all extremities.  NEUROLOGIC:  Cranial nerves II through XII are intact. No gross focal neurologic deficit. Sensation is intact. Reflexes are intact.  SKIN:  No ulceration, lesion, rash, or cyanosis. Skin is warm and dry, turgor intact. PSYCHIATRIC:  Mood and affect within normal limits. Alert and oriented x 3. Insight and judgment are intact.   LABORATORY AND RADIOLOGIC DATA:  Chest x-ray performed revealing left upper lobe infiltrate with minimal effusion. Remainder of laboratory data:  Sodium 141, potassium 2.6, chloride 104, bicarbonate 32, BUN 70, creatinine 0.81, glucose 109. LFTs are within normal limits. WBC 6.8, hemoglobin 15.3, platelets 185.   ASSESSMENT AND PLAN:  An 81 year old Caucasian female with history of asthma, however, on no medication, transient ischemic attack without residual neurologic deficits, presenting with shortness of breath. Recent discharge diagnosis was community-acquired pneumonia on Levaquin.   1.  Acute respiratory failure with hypoxemia secondary to community-acquired pneumonia. Provide supplemental oxygen to keep SaO2 greater than92%. DuoNeb treatments q. 4 hours and incentive spirometry. Essentially, this is continuation of treatment from previous visit; however, when she was discharged, she was hypoxemic. Should evaluate her for home oxygen prior to discharge.  2.  Venous thromboembolism prophylaxis with heparin subcutaneous fullcode 35 minutes.    ____________________________ Aaron Mose. Diondra Pines, MD dkh:nb D: 12/12/2013 23:44:34 ET T: 12/13/2013 00:23:59 ET JOB#: [id number]  cc: Aaron Mose. Aleia Larocca, MD, <Dictator> Dionel Archey Woodfin Ganja MD ELECTRONICALLY SIGNED 12/13/2013 20:41

## 2014-06-29 NOTE — Discharge Summary (Signed)
PATIENT NAME:  Allison Snyder, Allison Snyder MR#:  [id number] DATE OF BIRTH:  10-24-1929  DATE OF ADMISSION:  12/13/2013 DATE OF DISCHARGE:  12/13/2013  PRIMARY CARE PHYSICIAN:  Halina Maidens, MD.  FINAL DIAGNOSES:  1.  Acute respiratory failure, resolved; now pulse oximetry 95% on room air after exercise. 2.  Previous pneumonia.  3.  Reactive airway disease.   MEDICATIONS ON DISCHARGE INCLUDE: Albuterol nebulizer 3 mL every 6 hours as needed for shortness of breath, Levaquin 500 mg 1 tablet every 24 hours, finish previous prescription. Budesonide 0.5 mg mg/2 mL, 2 mL inhaled twice a day for 30 days. Stop taking the Qvar.   ACTIVITY: As tolerated.   FOLLOWUP:  1-2 weeks with Dr. Halina Maidens.   HOSPITAL COURSE: The patient was admitted as an observation in 12/12/2013 and discharged 12/13/2013.  Came in with shortness of breath and cough, was recently discharged for acute respiratory failure, pneumonia, and reactive airway disease. She was discharged home on Levaquin and Qvar inhaler. She did get better previously with budesonide nebulizer. Once I saw her admitted on October 8, I gave a stat budesonide nebulizer. When I saw her on October 8 she was moving good air, no longer required any oxygen, and was stable for discharge home. She did well with the budesonide nebulizer, so I will write her a prescription for that.   HOSPITAL COURSE PER PROBLEM LIST:  1.  Acute respiratory failure, pulse oximetry 87% on room air in the Emergency Room. I wish they gave the budesonide nebulizer in the Emergency Room. After I gave the budesonide nebulizer her acute respiratory failure had resolved. She was moving better air, pulse oximetry 95% on room air after exercise.  2.  Previous pneumonia. Continue her current prescription of Levaquin that she has at home, finish to completion.  3.  Reactive airway disease. The patient went home and did not do well with the Qvar inhaler. I will prescribe budesonide nebulizer. Follow up  in 1-2 weeks with Dr. Halina Maidens. The  family states that this happens every year around change of season, may have to do the nebulizer in the change of season.   Time spent on discharge: 35 minutes.    ____________________________ Tana Conch. Leslye Peer, MD rjw:lt D: 12/13/2013 13:35:56 ET T: 12/13/2013 14:35:23 ET JOB#: [id number]  cc: Tana Conch. Leslye Peer, MD, <Dictator> Halina Maidens, MD Marisue Brooklyn MD ELECTRONICALLY SIGNED 12/23/2013 12:49

## 2014-07-11 ENCOUNTER — Other Ambulatory Visit: Payer: Self-pay | Admitting: Internal Medicine

## 2014-07-11 ENCOUNTER — Ambulatory Visit
Admission: RE | Admit: 2014-07-11 | Discharge: 2014-07-11 | Disposition: A | Discharge status: Home / Self Care | Payer: Medicare Other | Source: Ambulatory Visit | Attending: Internal Medicine | Admitting: Internal Medicine

## 2014-07-11 ENCOUNTER — Ambulatory Visit: Payer: Medicare Other

## 2014-07-11 DIAGNOSIS — N6002 Solitary cyst of left breast: Secondary | ICD-10-CM | POA: Diagnosis not present

## 2014-07-11 DIAGNOSIS — N63 Unspecified lump in unspecified breast: Secondary | ICD-10-CM

## 2014-07-11 DIAGNOSIS — R928 Other abnormal and inconclusive findings on diagnostic imaging of breast: Secondary | ICD-10-CM | POA: Diagnosis present

## 2014-07-11 HISTORY — DX: Unspecified asthma, uncomplicated: J45.909

## 2014-07-11 LAB — HM MAMMOGRAPHY

## 2014-07-27 ENCOUNTER — Encounter: Payer: Self-pay | Admitting: Internal Medicine

## 2014-07-27 DIAGNOSIS — N289 Disorder of kidney and ureter, unspecified: Secondary | ICD-10-CM | POA: Insufficient documentation

## 2014-07-27 DIAGNOSIS — N399 Disorder of urinary system, unspecified: Secondary | ICD-10-CM | POA: Insufficient documentation

## 2014-07-27 DIAGNOSIS — D485 Neoplasm of uncertain behavior of skin: Secondary | ICD-10-CM | POA: Insufficient documentation

## 2014-07-27 DIAGNOSIS — J4530 Mild persistent asthma, uncomplicated: Secondary | ICD-10-CM | POA: Insufficient documentation

## 2014-07-27 DIAGNOSIS — N63 Unspecified lump in unspecified breast: Secondary | ICD-10-CM | POA: Insufficient documentation

## 2014-07-27 DIAGNOSIS — I700 Atherosclerosis of aorta: Secondary | ICD-10-CM | POA: Insufficient documentation

## 2014-07-30 ENCOUNTER — Emergency Department: Payer: No Typology Code available for payment source

## 2014-07-30 ENCOUNTER — Encounter: Payer: Self-pay | Admitting: Emergency Medicine

## 2014-07-30 ENCOUNTER — Emergency Department
Admission: EM | Admit: 2014-07-30 | Discharge: 2014-07-30 | Payer: No Typology Code available for payment source | Attending: Student | Admitting: Student

## 2014-07-30 DIAGNOSIS — R935 Abnormal findings on diagnostic imaging of other abdominal regions, including retroperitoneum: Secondary | ICD-10-CM | POA: Diagnosis not present

## 2014-07-30 DIAGNOSIS — Y998 Other external cause status: Secondary | ICD-10-CM | POA: Diagnosis not present

## 2014-07-30 DIAGNOSIS — M5137 Other intervertebral disc degeneration, lumbosacral region: Secondary | ICD-10-CM | POA: Diagnosis not present

## 2014-07-30 DIAGNOSIS — F419 Anxiety disorder, unspecified: Secondary | ICD-10-CM | POA: Diagnosis not present

## 2014-07-30 DIAGNOSIS — S3992XA Unspecified injury of lower back, initial encounter: Secondary | ICD-10-CM | POA: Diagnosis not present

## 2014-07-30 DIAGNOSIS — S60221A Contusion of right hand, initial encounter: Secondary | ICD-10-CM | POA: Insufficient documentation

## 2014-07-30 DIAGNOSIS — Z043 Encounter for examination and observation following other accident: Secondary | ICD-10-CM

## 2014-07-30 DIAGNOSIS — R0789 Other chest pain: Secondary | ICD-10-CM | POA: Diagnosis not present

## 2014-07-30 DIAGNOSIS — R079 Chest pain, unspecified: Secondary | ICD-10-CM | POA: Diagnosis not present

## 2014-07-30 DIAGNOSIS — K449 Diaphragmatic hernia without obstruction or gangrene: Secondary | ICD-10-CM | POA: Diagnosis not present

## 2014-07-30 DIAGNOSIS — S2611XA Contusion of heart without hemopericardium, initial encounter: Secondary | ICD-10-CM | POA: Diagnosis not present

## 2014-07-30 DIAGNOSIS — M19019 Primary osteoarthritis, unspecified shoulder: Secondary | ICD-10-CM | POA: Diagnosis not present

## 2014-07-30 DIAGNOSIS — M545 Low back pain: Secondary | ICD-10-CM | POA: Diagnosis not present

## 2014-07-30 DIAGNOSIS — M47817 Spondylosis without myelopathy or radiculopathy, lumbosacral region: Secondary | ICD-10-CM | POA: Diagnosis not present

## 2014-07-30 DIAGNOSIS — G8929 Other chronic pain: Secondary | ICD-10-CM | POA: Insufficient documentation

## 2014-07-30 DIAGNOSIS — Y92410 Unspecified street and highway as the place of occurrence of the external cause: Secondary | ICD-10-CM | POA: Diagnosis not present

## 2014-07-30 DIAGNOSIS — J45909 Unspecified asthma, uncomplicated: Secondary | ICD-10-CM | POA: Diagnosis not present

## 2014-07-30 DIAGNOSIS — S36112A Contusion of liver, initial encounter: Secondary | ICD-10-CM | POA: Diagnosis not present

## 2014-07-30 DIAGNOSIS — Z7951 Long term (current) use of inhaled steroids: Secondary | ICD-10-CM | POA: Insufficient documentation

## 2014-07-30 DIAGNOSIS — Y9389 Activity, other specified: Secondary | ICD-10-CM | POA: Diagnosis not present

## 2014-07-30 DIAGNOSIS — S299XXA Unspecified injury of thorax, initial encounter: Secondary | ICD-10-CM | POA: Diagnosis not present

## 2014-07-30 DIAGNOSIS — S2611XS Contusion of heart without hemopericardium, sequela: Secondary | ICD-10-CM

## 2014-07-30 DIAGNOSIS — R609 Edema, unspecified: Secondary | ICD-10-CM | POA: Diagnosis not present

## 2014-07-30 DIAGNOSIS — R0781 Pleurodynia: Secondary | ICD-10-CM | POA: Diagnosis not present

## 2014-07-30 DIAGNOSIS — S20212A Contusion of left front wall of thorax, initial encounter: Secondary | ICD-10-CM | POA: Diagnosis not present

## 2014-07-30 DIAGNOSIS — M19012 Primary osteoarthritis, left shoulder: Secondary | ICD-10-CM | POA: Diagnosis not present

## 2014-07-30 DIAGNOSIS — R072 Precordial pain: Secondary | ICD-10-CM | POA: Diagnosis not present

## 2014-07-30 DIAGNOSIS — S40812A Abrasion of left upper arm, initial encounter: Secondary | ICD-10-CM | POA: Diagnosis not present

## 2014-07-30 DIAGNOSIS — M25519 Pain in unspecified shoulder: Secondary | ICD-10-CM | POA: Diagnosis not present

## 2014-07-30 DIAGNOSIS — N858 Other specified noninflammatory disorders of uterus: Secondary | ICD-10-CM | POA: Diagnosis not present

## 2014-07-30 DIAGNOSIS — R109 Unspecified abdominal pain: Secondary | ICD-10-CM | POA: Diagnosis not present

## 2014-07-30 DIAGNOSIS — M546 Pain in thoracic spine: Secondary | ICD-10-CM | POA: Diagnosis not present

## 2014-07-30 DIAGNOSIS — Z041 Encounter for examination and observation following transport accident: Secondary | ICD-10-CM | POA: Diagnosis not present

## 2014-07-30 DIAGNOSIS — M5032 Other cervical disc degeneration, mid-cervical region: Secondary | ICD-10-CM | POA: Diagnosis not present

## 2014-07-30 DIAGNOSIS — M549 Dorsalgia, unspecified: Secondary | ICD-10-CM | POA: Diagnosis not present

## 2014-07-30 DIAGNOSIS — S40212A Abrasion of left shoulder, initial encounter: Secondary | ICD-10-CM | POA: Insufficient documentation

## 2014-07-30 DIAGNOSIS — M4312 Spondylolisthesis, cervical region: Secondary | ICD-10-CM | POA: Diagnosis not present

## 2014-07-30 DIAGNOSIS — S4992XA Unspecified injury of left shoulder and upper arm, initial encounter: Secondary | ICD-10-CM | POA: Diagnosis not present

## 2014-07-30 DIAGNOSIS — R55 Syncope and collapse: Secondary | ICD-10-CM | POA: Diagnosis not present

## 2014-07-30 DIAGNOSIS — N281 Cyst of kidney, acquired: Secondary | ICD-10-CM | POA: Diagnosis not present

## 2014-07-30 DIAGNOSIS — M25531 Pain in right wrist: Secondary | ICD-10-CM | POA: Diagnosis not present

## 2014-07-30 DIAGNOSIS — S2691XA Contusion of heart, unspecified with or without hemopericardium, initial encounter: Secondary | ICD-10-CM | POA: Diagnosis not present

## 2014-07-30 DIAGNOSIS — H109 Unspecified conjunctivitis: Secondary | ICD-10-CM | POA: Diagnosis not present

## 2014-07-30 DIAGNOSIS — H5711 Ocular pain, right eye: Secondary | ICD-10-CM | POA: Diagnosis not present

## 2014-07-30 DIAGNOSIS — M8580 Other specified disorders of bone density and structure, unspecified site: Secondary | ICD-10-CM | POA: Diagnosis not present

## 2014-07-30 DIAGNOSIS — K769 Liver disease, unspecified: Secondary | ICD-10-CM | POA: Diagnosis not present

## 2014-07-30 DIAGNOSIS — M5134 Other intervertebral disc degeneration, thoracic region: Secondary | ICD-10-CM | POA: Diagnosis not present

## 2014-07-30 HISTORY — DX: Tubal ligation status: Z98.51

## 2014-07-30 HISTORY — DX: Acquired absence of other specified parts of digestive tract: Z90.49

## 2014-07-30 LAB — COMPREHENSIVE METABOLIC PANEL
ALK PHOS: 81 U/L (ref 38–126)
ALT: 185 U/L — ABNORMAL HIGH (ref 14–54)
AST: 326 U/L — ABNORMAL HIGH (ref 15–41)
Albumin: 4.3 g/dL (ref 3.5–5.0)
Anion gap: 8 (ref 5–15)
BUN: 13 mg/dL (ref 6–20)
CALCIUM: 9.6 mg/dL (ref 8.9–10.3)
CO2: 31 mmol/L (ref 22–32)
CREATININE: 0.78 mg/dL (ref 0.44–1.00)
Chloride: 100 mmol/L — ABNORMAL LOW (ref 101–111)
GFR calc Af Amer: >60 mL/min (ref >60)
Glucose, Bld: 137 mg/dL — ABNORMAL HIGH (ref 65–99)
Potassium: 3.8 mmol/L (ref 3.5–5.1)
Sodium: 139 mmol/L (ref 135–145)
Total Bilirubin: 0.6 mg/dL (ref 0.3–1.2)
Total Protein: 7.9 g/dL (ref 6.5–8.1)

## 2014-07-30 LAB — CBC WITH DIFFERENTIAL/PLATELET
BASOS ABS: 0 10*3/uL (ref 0–0.1)
Basophils Relative: 1 %
EOS ABS: 0 10*3/uL (ref 0–0.7)
EOS PCT: 1 %
HCT: 47.6 % — ABNORMAL HIGH (ref 35.0–47.0)
HEMOGLOBIN: 15.9 g/dL (ref 12.0–16.0)
LYMPHS PCT: 19 %
Lymphs Abs: 1.5 10*3/uL (ref 1.0–3.6)
MCH: 30.2 pg (ref 26.0–34.0)
MCHC: 33.3 g/dL (ref 32.0–36.0)
MCV: 90.4 fL (ref 80.0–100.0)
Monocytes Absolute: 0.6 10*3/uL (ref 0.2–0.9)
Monocytes Relative: 7 %
Neutro Abs: 5.8 10*3/uL (ref 1.4–6.5)
Neutrophils Relative %: 72 %
Platelets: 173 10*3/uL (ref 150–440)
RBC: 5.26 MIL/uL — ABNORMAL HIGH (ref 3.80–5.20)
RDW: 13.1 % (ref 11.5–14.5)
WBC: 8 10*3/uL (ref 3.6–11.0)

## 2014-07-30 LAB — TROPONIN I: TROPONIN I: 0.09 ng/mL — AB (ref <0.031)

## 2014-07-30 MED ORDER — SODIUM CHLORIDE 0.9 % IV BOLUS (SEPSIS)
500.0000 mL | Freq: Once | INTRAVENOUS | Status: AC
  Administered 2014-07-30: 500 mL via INTRAVENOUS

## 2014-07-30 MED ORDER — ALUM & MAG HYDROXIDE-SIMETH 200-200-20 MG/5ML PO SUSP
ORAL | Status: AC
  Administered 2014-07-30: 30 mL via ORAL
  Filled 2014-07-30: qty 30

## 2014-07-30 MED ORDER — ALUM & MAG HYDROXIDE-SIMETH 200-200-20 MG/5ML PO SUSP
30.0000 mL | Freq: Once | ORAL | Status: AC
  Administered 2014-07-30: 30 mL via ORAL

## 2014-07-30 MED ORDER — DOXYCYCLINE HYCLATE 100 MG PO TABS
100.0000 mg | ORAL_TABLET | Freq: Once | ORAL | Status: AC
  Administered 2014-07-30: 100 mg via ORAL

## 2014-07-30 MED ORDER — IOHEXOL 350 MG/ML SOLN
100.0000 mL | Freq: Once | INTRAVENOUS | Status: AC | PRN
  Administered 2014-07-30: 100 mL via INTRAVENOUS

## 2014-07-30 MED ORDER — DOXYCYCLINE HYCLATE 100 MG PO TABS: 100.0000 mg | ORAL_TABLET | Freq: Two times a day (BID) | ORAL | Status: DC

## 2014-07-30 MED ORDER — DOXYCYCLINE HYCLATE 100 MG PO TABS
ORAL_TABLET | ORAL | Status: AC
  Administered 2014-07-30: 100 mg via ORAL
  Filled 2014-07-30: qty 1

## 2014-07-30 MED ORDER — VANCOMYCIN HCL IN DEXTROSE 1-5 GM/200ML-% IV SOLN
INTRAVENOUS | Status: AC
  Filled 2014-07-30: qty 200

## 2014-07-30 NOTE — ED Notes (Signed)
Dr. Edd Fabian examined pt and removed backboard and c-collar

## 2014-07-30 NOTE — Discharge Instructions (Signed)
Chest Contusion °A contusion is a deep bruise. Bruises happen when an injury causes bleeding under the skin. Signs of bruising include pain, puffiness (swelling), and discolored skin. The bruise may turn blue, purple, or yellow.  °HOME CARE °· Put ice on the injured area. °¨ Put ice in a plastic bag. °¨ Place a towel between the skin and the bag. °¨ Leave the ice on for 15-20 minutes at a time, 03-04 times a day for the first 48 hours. °· Only take medicine as told by your doctor. °· Rest. °· Take deep breaths (deep-breathing exercises) as told by your doctor. °· Stop smoking if you smoke. °· Do not lift objects over 5 pounds (2.3 kilograms) for 3 days or longer if told by your doctor. °GET HELP RIGHT AWAY IF:  °· You have more bruising or puffiness. °· You have pain that gets worse. °· You have trouble breathing. °· You are dizzy, weak, or pass out (faint). °· You have blood in your pee (urine) or poop (stool). °· You cough up or throw up (vomit) blood. °· Your puffiness or pain is not helped with medicines. °MAKE SURE YOU:  °· Understand these instructions. °· Will watch your condition. °· Will get help right away if you are not doing well or get worse. °Document Released: 08/11/2007 Document Revised: 11/17/2011 Document Reviewed: 08/16/2011 °[employer]® Patient Information ©2015 [employer]. This information is not intended to replace advice given to you by your health care provider. Make sure you discuss any questions you have with your health care provider. ° °
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
## 2014-07-31 DIAGNOSIS — M5137 Other intervertebral disc degeneration, lumbosacral region: Secondary | ICD-10-CM | POA: Diagnosis not present

## 2014-07-31 DIAGNOSIS — T1490 Injury, unspecified: Secondary | ICD-10-CM | POA: Diagnosis not present

## 2014-07-31 DIAGNOSIS — M8580 Other specified disorders of bone density and structure, unspecified site: Secondary | ICD-10-CM | POA: Diagnosis not present

## 2014-07-31 DIAGNOSIS — M47817 Spondylosis without myelopathy or radiculopathy, lumbosacral region: Secondary | ICD-10-CM | POA: Diagnosis not present

## 2014-08-02 DIAGNOSIS — S40012A Contusion of left shoulder, initial encounter: Secondary | ICD-10-CM | POA: Diagnosis not present

## 2014-08-02 DIAGNOSIS — J4530 Mild persistent asthma, uncomplicated: Secondary | ICD-10-CM | POA: Diagnosis not present

## 2014-08-02 DIAGNOSIS — H109 Unspecified conjunctivitis: Secondary | ICD-10-CM | POA: Diagnosis not present

## 2014-08-02 DIAGNOSIS — S20211A Contusion of right front wall of thorax, initial encounter: Secondary | ICD-10-CM | POA: Diagnosis not present

## 2014-09-23 ENCOUNTER — Encounter: Payer: Self-pay | Admitting: Internal Medicine

## 2014-09-23 DIAGNOSIS — N281 Cyst of kidney, acquired: Secondary | ICD-10-CM | POA: Insufficient documentation

## 2014-09-24 ENCOUNTER — Ambulatory Visit (INDEPENDENT_AMBULATORY_CARE_PROVIDER_SITE_OTHER): Payer: Medicare Other | Admitting: Internal Medicine

## 2014-09-24 ENCOUNTER — Encounter: Payer: Self-pay | Admitting: Internal Medicine

## 2014-09-24 VITALS — BP 124/60 | HR 80 | Ht 64.0 in | Wt 165.0 lb

## 2014-09-24 DIAGNOSIS — J4530 Mild persistent asthma, uncomplicated: Secondary | ICD-10-CM | POA: Diagnosis not present

## 2014-09-24 DIAGNOSIS — D485 Neoplasm of uncertain behavior of skin: Secondary | ICD-10-CM

## 2014-09-24 DIAGNOSIS — N63 Unspecified lump in unspecified breast: Secondary | ICD-10-CM

## 2014-09-24 DIAGNOSIS — H5712 Ocular pain, left eye: Secondary | ICD-10-CM | POA: Diagnosis not present

## 2014-09-24 MED ORDER — BUDESONIDE 0.5 MG/2ML IN SUSP: 2.0000 mL | Freq: Two times a day (BID) | RESPIRATORY_TRACT | Status: DC

## 2014-09-24 NOTE — Progress Notes (Signed)
Date:  09/24/2014   Name:  Allison Snyder   DOB:  1929/05/24   MRN:  [id number]   Chief Complaint: Shortness of Breath Other This is a new (skin lesion on side of face) problem. The current episode started 1 to 4 weeks ago. The problem occurs constantly. The problem has been unchanged. Associated symptoms include arthralgias and coughing. Pertinent negatives include no abdominal pain or fever. Nothing aggravates the symptoms. She has tried nothing for the symptoms.  Asthma She complains of cough. There is no chest tightness. This is a chronic problem. The problem occurs daily. The problem has been gradually improving. The cough is productive of sputum. Pertinent negatives include no appetite change or fever. Her symptoms are aggravated by animal exposure. Her symptoms are alleviated by beta-agonist and oral steroids. She reports significant improvement on treatment.   Also wants breast rechecked.  She had a lump noted on mammogram.  [place] was suspicious for a complex cyst.  When she went to a biopsy she told them she reacted to lidocaine so they deferred the biopsy.  Plan is for a repeat US in August.  Review of Systems:  Review of Systems  Constitutional: Negative for fever and appetite change.  HENT: Negative for facial swelling, hearing loss, sinus pressure and tinnitus.   Eyes: Positive for pain (may have been exposed to tea tree oil).  Respiratory: Positive for cough. Negative for chest tightness.   Cardiovascular: Negative for palpitations.  Gastrointestinal: Negative for abdominal pain.  Genitourinary:       Breast mass needs to be recheck  Musculoskeletal: Positive for arthralgias.  Skin: Positive for color change (new lesion on right cheek). Negative for wound.  Neurological: Positive for light-headedness. Negative for seizures and syncope.  Psychiatric/Behavioral: Negative for dysphoric mood.    Patient Active Problem List   Diagnosis Date Noted  . Bilateral renal cysts  09/23/2014  . Asthma, mild persistent 07/27/2014  . Aortic atherosclerosis 07/27/2014  . Breast lump 07/27/2014  . Neoplasm of uncertain behavior of skin 07/27/2014  . Impaired renal function 07/27/2014    Prior to Admission medications   Medication Sig Start Date End Date Taking? Authorizing Provider  budesonide (PULMICORT) 0.5 MG/2ML nebulizer solution Inhale 2 mLs into the lungs 2 (two) times daily. 06/27/14  Yes Historical Provider, MD  doxycycline (VIBRA-TABS) 100 MG tablet Take 1 tablet (100 mg total) by mouth 2 (two) times daily. Patient not taking: Reported on 09/24/2014 07/30/14   Joanne Gavel, MD    Allergies  Allergen Reactions  . Levaquin [Levofloxacin] Shortness Of Breath  . Prednisone Shortness Of Breath  . Albuterol Anxiety    Past Surgical History  Procedure Laterality Date  . Abdominal hysterectomy    . Tubal ligation      History  Substance Use Topics  . Smoking status: Never Smoker   . Smokeless tobacco: Not on file  . Alcohol Use: No     Medication list has been reviewed and updated.  Physical Examination:  Physical Exam  Constitutional: She appears well-developed and well-nourished. No distress.  Eyes: Conjunctivae, EOM and lids are normal. Pupils are equal, round, and reactive to light.  Neck: No tracheal tenderness present. No thyromegaly present.  Cardiovascular: Normal rate, regular rhythm and S1 normal.   Pulmonary/Chest: Effort normal and breath sounds normal. She has no wheezes. She has no rhonchi.    Skin: Skin is warm, dry and intact.  Several AK/SK on right cheek and left temple.  BP 124/60 mmHg  Pulse 80  Ht 5\' 4"  (1.626 m)  Wt 165 lb (74.844 kg)  BMI 28.31 kg/m2  Assessment and Plan: 1. Breast lump Mass persists; - US BREAST LTD UNI LEFT INC AXILLA; Future  2. Asthma, mild persistent, uncomplicated Improved - continue budesonide bid  3. Neoplasm of uncertain behavior of skin Consider Dermatology referral if  changing  4. Eye pain, left Use moisturizing eye drops If persistent - will refer to Opthalmology  Halina Maidens, MD [facility]  09/24/2014

## 2014-09-26 ENCOUNTER — Telehealth: Payer: Self-pay

## 2014-09-26 ENCOUNTER — Other Ambulatory Visit: Payer: Self-pay | Admitting: Internal Medicine

## 2014-09-26 MED ORDER — BUDESONIDE 0.5 MG/2ML IN SUSP: 2.0000 mL | Freq: Two times a day (BID) | RESPIRATORY_TRACT | Status: DC

## 2014-09-26 NOTE — Telephone Encounter (Signed)
[facility] called needs diagnosis code on Budesonide due to insurance it must be on [email or web address]  [employer]

## 2014-09-30 ENCOUNTER — Other Ambulatory Visit: Payer: Self-pay | Admitting: Internal Medicine

## 2014-09-30 MED ORDER — BUDESONIDE 0.5 MG/2ML IN SUSP: 2.0000 mL | Freq: Two times a day (BID) | RESPIRATORY_TRACT | Status: DC

## 2014-10-05 IMAGING — CR DG CHEST 2V
1 series · 2 of 2 positions shown · non-contrast
Comparison: October 18, 2013.

CLINICAL DATA: Acute cough and shortness of breath.

EXAM:
CHEST  2 VIEW

[Series 1: dxr chest pa (or ap) and lateral · 0.14mm/px · 2 of 2 slices shown]
[im 1/2]
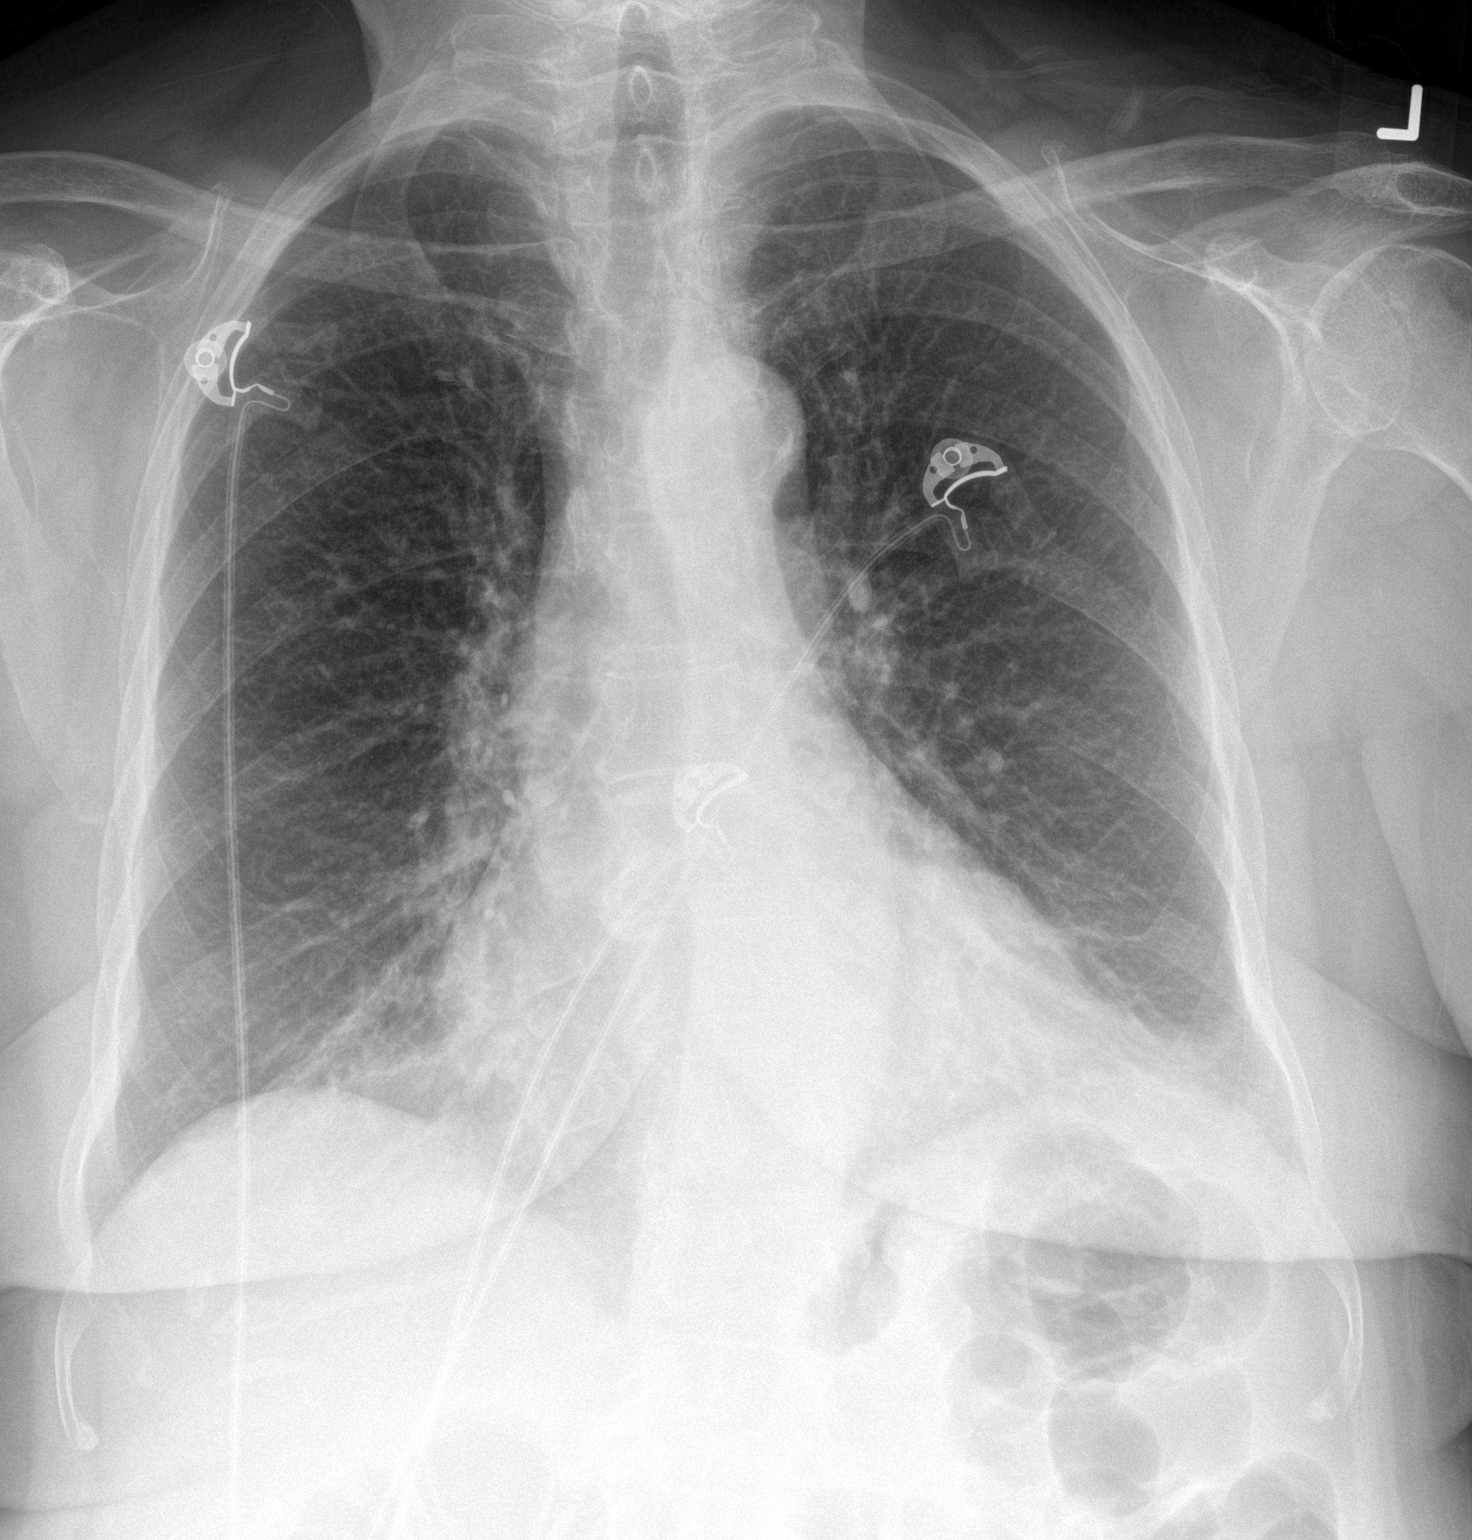
[im 2/2]
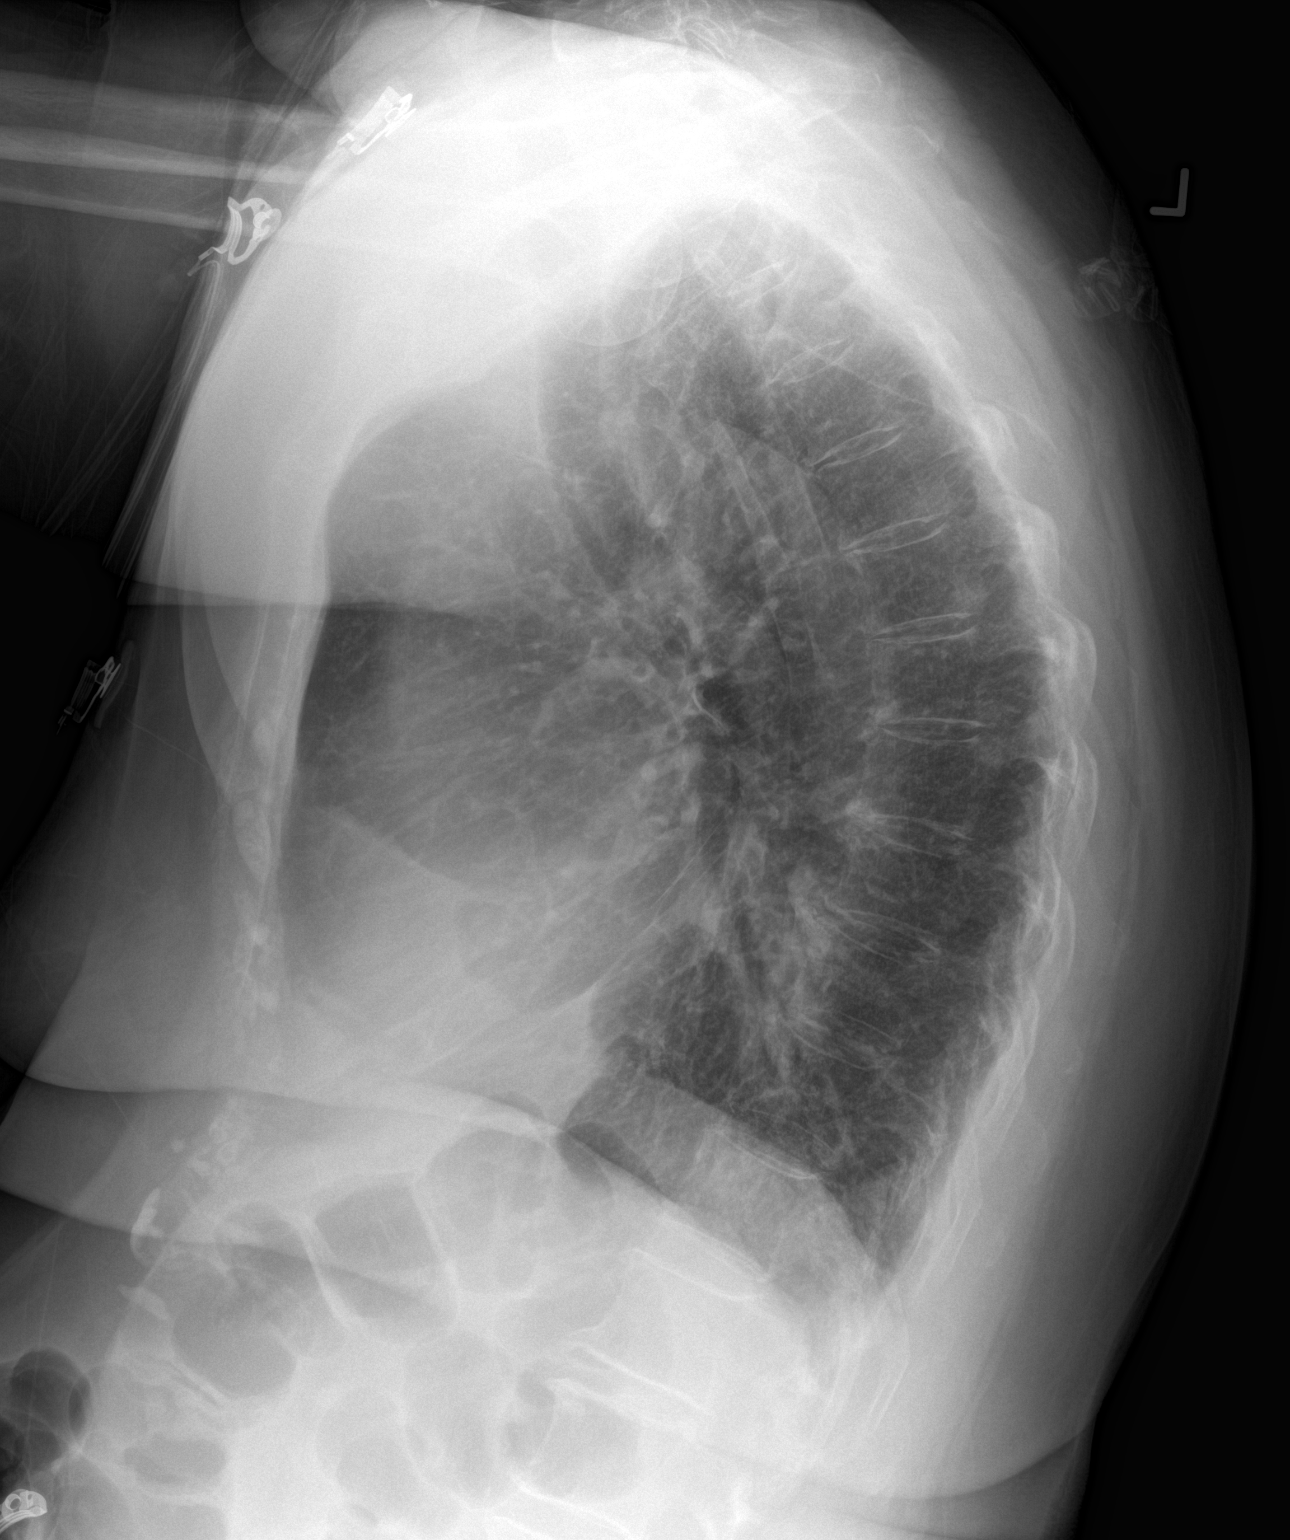

[2 of 2 positions shown; findings below may reference images not displayed]

FINDINGS: Stable cardiomediastinal silhouette. No pneumothorax or pleural
effusion is noted. Hyperinflation of the lungs is noted suggesting
chronic obstructive pulmonary disease. Stable bilateral basilar
subsegmental atelectasis or scarring is noted. Bony thorax appears
intact.
IMPRESSION: Hyperinflation of the lungs is noted consistent with chronic
obstructive pulmonary disease. Stable bilateral basilar subsegmental
atelectasis or scarring is noted.

## 2014-10-15 ENCOUNTER — Ambulatory Visit
Admission: RE | Admit: 2014-10-15 | Discharge: 2014-10-15 | Disposition: A | Discharge status: Home / Self Care | Payer: Medicare Other | Source: Ambulatory Visit | Attending: Internal Medicine | Admitting: Internal Medicine

## 2014-10-15 DIAGNOSIS — N63 Unspecified lump in unspecified breast: Secondary | ICD-10-CM

## 2014-10-16 ENCOUNTER — Other Ambulatory Visit: Payer: Self-pay | Admitting: Internal Medicine

## 2014-10-16 DIAGNOSIS — N6002 Solitary cyst of left breast: Secondary | ICD-10-CM

## 2014-10-16 DIAGNOSIS — N63 Unspecified lump in unspecified breast: Secondary | ICD-10-CM

## 2014-10-16 DIAGNOSIS — R928 Other abnormal and inconclusive findings on diagnostic imaging of breast: Secondary | ICD-10-CM

## 2014-10-22 ENCOUNTER — Ambulatory Visit: Payer: Medicare Other

## 2014-10-22 ENCOUNTER — Encounter

## 2014-10-25 ENCOUNTER — Ambulatory Visit

## 2014-10-25 ENCOUNTER — Encounter

## 2014-12-11 ENCOUNTER — Encounter: Payer: Self-pay | Admitting: Internal Medicine

## 2014-12-11 ENCOUNTER — Ambulatory Visit (INDEPENDENT_AMBULATORY_CARE_PROVIDER_SITE_OTHER): Payer: Medicare Other | Admitting: Internal Medicine

## 2014-12-11 VITALS — BP 92/60 | HR 72 | Ht 64.0 in | Wt 162.2 lb

## 2014-12-11 DIAGNOSIS — N6320 Unspecified lump in the left breast, unspecified quadrant: Secondary | ICD-10-CM

## 2014-12-11 DIAGNOSIS — Z23 Encounter for immunization: Secondary | ICD-10-CM | POA: Diagnosis not present

## 2014-12-11 DIAGNOSIS — I951 Orthostatic hypotension: Secondary | ICD-10-CM | POA: Diagnosis not present

## 2014-12-11 DIAGNOSIS — I499 Cardiac arrhythmia, unspecified: Secondary | ICD-10-CM

## 2014-12-11 DIAGNOSIS — N63 Unspecified lump in breast: Secondary | ICD-10-CM | POA: Diagnosis not present

## 2014-12-11 NOTE — Progress Notes (Signed)
Date:  12/11/2014   Name:  Allison Snyder   DOB:  04/19/1929   MRN:  [id number]   Chief Complaint: Weight Loss Dizziness This is a new problem. The problem occurs 2 to 4 times per day. The problem has been unchanged. Associated symptoms include fatigue. Pertinent negatives include no abdominal pain, chest pain, chills, coughing, fever, headaches or weakness. The symptoms are aggravated by walking and standing. She has tried rest for the symptoms.   Patient reports that she keeps her 2 small great-grandchildren. She is very busy running after them. Recently she's had lightheadedness when she stands for a few minutes. She has to sit down and has not passed out. She feels like there is pressure in her head, but she has no history of coronary disease or cerebral disease. She denies any palpitations or irregular heartbeat. She denies headache change in vision or focal weakness. Her only medication is Pulmicort nebulizer solution.  The patient also complains of decreased appetite. Since her husband passed way she has little interest in cooking. She often eats out. Sometimes she skips meals completely. Review of vital signs reveals a 6 pound weight loss in the past 6 months.  Abnormal mammogram noted in July. On the left side there was a density for which biopsy was recommended. Patient was hesitant due to a reaction to a local anesthetic so it was decided to have a follow-up ultrasound in 3 months to document change. She denies any breast problems. She did not follow-up with her evaluation due to family issues and concern over local anesthesia reaction.     Study Result     CLINICAL DATA: Follow-up for a probably benign left breast mass.  EXAM: ULTRASOUND OF THE LEFT BREAST  COMPARISON: Previous exam(s).  FINDINGS: Physical examination of the left breast reveals an area of thickening at the approximate 10 o'clock position 4 cm from the nipple.  Targeted ultrasound of the left breast  was performed demonstrating an oval circumscribed hypoechoic mass with internal echoes at 10 o'clock 4 cm from nipple measuring 1 x 1 by 1.3 cm. This is overall unchanged in size in appearance given differences in imaging in measurement technique. No lymphadenopathy seen in the left axilla.  IMPRESSION: Indeterminate left breast mass which may represent a complicated cyst.  RECOMMENDATION: Ultrasound-guided aspiration of the mass in the left breast at 10 o'clock is recommended. If this cannot be aspirated, then ultrasound-guided biopsy is recommended. The findings are recommendations have been discussed with the patient.  The patient reports that she has had an issue with numbing medication during a dental procedure remotely many years ago (palpitations). It is recommended she receive lidocaine without epinephrine for her breast aspiration/biopsy.  I have discussed the findings and recommendations with the patient. Results were also provided in writing at the conclusion of the visit. If applicable, a reminder letter will be sent to the patient regarding the next appointment.  BI-RADS CATEGORY Category 4A: Low suspicion for malignancy.   Electronically Signed  By: Everlean Alstrom M.D.  On: 10/15/2014 13:42    Review of Systems:  Review of Systems  Constitutional: Positive for fatigue and unexpected weight change. Negative for fever and chills.  HENT: Negative for hearing loss.   Respiratory: Positive for shortness of breath. Negative for cough, chest tightness and wheezing.   Cardiovascular: Negative for chest pain, palpitations and leg swelling.  Gastrointestinal: Negative for abdominal pain.  Neurological: Positive for dizziness and light-headedness. Negative for seizures, syncope, speech difficulty, weakness and  headaches.    Patient Active Problem List   Diagnosis Date Noted  . Breast mass, left 12/11/2014  . Bilateral renal cysts 09/23/2014  . Asthma,  mild persistent 07/27/2014  . Aortic atherosclerosis ([place]) 07/27/2014  . Breast lump 07/27/2014  . Neoplasm of uncertain behavior of skin 07/27/2014  . Impaired renal function 07/27/2014    Prior to Admission medications   Medication Sig Start Date End Date Taking? Authorizing Provider  budesonide (PULMICORT) 0.5 MG/2ML nebulizer solution Inhale 2 mLs (0.5 mg total) into the lungs 2 (two) times daily. 09/30/14  Yes Glean Hess, MD    Allergies  Allergen Reactions  . Levaquin [Levofloxacin] Shortness Of Breath  . Prednisone Shortness Of Breath  . Albuterol Anxiety    Past Surgical History  Procedure Laterality Date  . Abdominal hysterectomy    . Tubal ligation      Social History  Substance Use Topics  . Smoking status: Never Smoker   . Smokeless tobacco: None  . Alcohol Use: No     Medication list has been reviewed and updated.  Physical Examination:  Physical Exam  Constitutional: She is oriented to person, place, and time. She appears well-nourished.  Neck: Normal range of motion. Neck supple. Carotid bruit is not present.  Cardiovascular: Normal rate and normal pulses.  An irregularly irregular rhythm present.  No murmur heard. Pulmonary/Chest: Effort normal. No accessory muscle usage. No respiratory distress. She has decreased breath sounds.  Musculoskeletal: She exhibits no edema or tenderness.  Neurological: She is alert and oriented to person, place, and time.  Skin: Skin is warm and dry.  Psychiatric: Her speech is normal and behavior is normal. She exhibits a depressed mood.  Nursing note and vitals reviewed.   BP 138/64 mmHg  Pulse 72  Ht 5\' 4"  (1.626 m)  Wt 162 lb 3.2 oz (73.573 kg)  BMI 27.83 kg/m2   Assessment and Plan: 1. Cardiac arrhythmia, unspecified cardiac arrhythmia type Patient is not advised not to drive Patient is advised to immediately have someone else care for her small great-grandchildren  - EKG 12-Lead - junctional rhythm -  CBC with Differential/Platelet - TSH - Ambulatory referral to Cardiology  2. Orthostatic hypotension Significant orthostatic changes noted Likely secondary to arrhythmia possible mild dehydration and poor appetite; patient is encouraged to increase her fluid intake and eat 3 regular meals daily - Comprehensive metabolic panel - CBC with Differential/Platelet - Ambulatory referral to Cardiology  3. Flu vaccine need - Flu Vaccine QUAD 36+ mos PF IM (Fluarix & Fluzone Quad PF)  4. Breast mass, left Due for follow-up mammogram and ultrasound in November Presumably she is on a callback list from Benay Pike, MD [facility]  12/11/2014

## 2014-12-12 DIAGNOSIS — R42 Dizziness and giddiness: Secondary | ICD-10-CM | POA: Diagnosis not present

## 2014-12-12 DIAGNOSIS — I700 Atherosclerosis of aorta: Secondary | ICD-10-CM | POA: Diagnosis not present

## 2014-12-12 DIAGNOSIS — R0602 Shortness of breath: Secondary | ICD-10-CM | POA: Diagnosis not present

## 2014-12-12 DIAGNOSIS — N63 Unspecified lump in breast: Secondary | ICD-10-CM | POA: Diagnosis not present

## 2014-12-19 ENCOUNTER — Encounter: Payer: Self-pay | Admitting: Emergency Medicine

## 2014-12-19 ENCOUNTER — Emergency Department
Admission: EM | Admit: 2014-12-19 | Discharge: 2014-12-19 | Disposition: A | Discharge status: Home / Self Care | Payer: Medicare Other | Attending: Emergency Medicine | Admitting: Emergency Medicine

## 2014-12-19 ENCOUNTER — Emergency Department: Payer: Medicare Other

## 2014-12-19 DIAGNOSIS — R42 Dizziness and giddiness: Secondary | ICD-10-CM

## 2014-12-19 DIAGNOSIS — R531 Weakness: Secondary | ICD-10-CM | POA: Insufficient documentation

## 2014-12-19 DIAGNOSIS — Z7951 Long term (current) use of inhaled steroids: Secondary | ICD-10-CM | POA: Insufficient documentation

## 2014-12-19 DIAGNOSIS — I951 Orthostatic hypotension: Secondary | ICD-10-CM | POA: Diagnosis not present

## 2014-12-19 LAB — URINALYSIS COMPLETE WITH MICROSCOPIC (ARMC ONLY)
BILIRUBIN URINE: NEGATIVE
Bacteria, UA: NONE SEEN
GLUCOSE, UA: NEGATIVE mg/dL
Hgb urine dipstick: NEGATIVE
KETONES UR: NEGATIVE mg/dL
Nitrite: NEGATIVE
PROTEIN: NEGATIVE mg/dL
Specific Gravity, Urine: 1.006 (ref 1.005–1.030)
pH: 6 (ref 5.0–8.0)

## 2014-12-19 LAB — CBC
HCT: 43.1 % (ref 35.0–47.0)
Hemoglobin: 14.4 g/dL (ref 12.0–16.0)
MCH: 30.3 pg (ref 26.0–34.0)
MCHC: 33.5 g/dL (ref 32.0–36.0)
MCV: 90.5 fL (ref 80.0–100.0)
PLATELETS: 181 10*3/uL (ref 150–440)
RBC: 4.76 MIL/uL (ref 3.80–5.20)
RDW: 12.6 % (ref 11.5–14.5)
WBC: 6.7 10*3/uL (ref 3.6–11.0)

## 2014-12-19 LAB — BASIC METABOLIC PANEL
Anion gap: 8 (ref 5–15)
BUN: 16 mg/dL (ref 6–20)
CHLORIDE: 103 mmol/L (ref 101–111)
CO2: 29 mmol/L (ref 22–32)
CREATININE: 0.65 mg/dL (ref 0.44–1.00)
Calcium: 9.2 mg/dL (ref 8.9–10.3)
GFR calc Af Amer: >60 mL/min (ref >60)
GFR calc non Af Amer: >60 mL/min (ref >60)
Glucose, Bld: 101 mg/dL — ABNORMAL HIGH (ref 65–99)
Potassium: 3.5 mmol/L (ref 3.5–5.1)
SODIUM: 140 mmol/L (ref 135–145)

## 2014-12-19 LAB — GLUCOSE, CAPILLARY: Glucose-Capillary: 75 mg/dL (ref 65–99)

## 2014-12-19 NOTE — Discharge Instructions (Signed)
You have been seen today in the Emergency Department (ED)  for lightheadedness/dizziness.  Your workup including labs and EKG show reassuring results.  Your symptoms may be due to mild dehydration, so it is important that you drink plenty of non-alcoholic fluids.  Please call your regular doctor as soon as possible to schedule the next available clinic appointment to follow up with him/her regarding your visit to the ED and your symptoms.  Return to the Emergency Department (ED)  if you have any new or worsening symptoms that concern you.   Near-Syncope Near-syncope (commonly known as near fainting) is sudden weakness, dizziness, or feeling like you might pass out. During an episode of near-syncope, you may also develop pale skin, have tunnel vision, or feel sick to your stomach (nauseous). Near-syncope may occur when getting up after sitting or while standing for a long time. It is caused by a sudden decrease in blood flow to the brain. This decrease can result from various causes or triggers, most of which are not serious. However, because near-syncope can sometimes be a sign of something serious, a medical evaluation is required. The specific cause is often not determined. HOME CARE INSTRUCTIONS  Monitor your condition for any changes. The following actions may help to alleviate any discomfort you are experiencing:  Have someone stay with you until you feel stable.  Lie down right away and prop your feet up if you start feeling like you might faint. Breathe deeply and steadily. Wait until all the symptoms have passed. Most of these episodes last only a few minutes. You may feel tired for several hours.   Drink enough fluids to keep your urine clear or pale yellow.   If you are taking blood pressure or heart medicine, get up slowly when seated or lying down. Take several minutes to sit and then stand. This can reduce dizziness.  Follow up with your health care provider as directed. SEEK  IMMEDIATE MEDICAL CARE IF:   You have a severe headache.   You have unusual pain in the chest, abdomen, or back.   You are bleeding from the mouth or rectum, or you have black or tarry stool.   You have an irregular or very fast heartbeat.   You have repeated fainting or have seizure-like jerking during an episode.   You faint when sitting or lying down.   You have confusion.   You have difficulty walking.   You have severe weakness.   You have vision problems.  MAKE SURE YOU:   Understand these instructions.  Will watch your condition.  Will get help right away if you are not doing well or get worse.   This information is not intended to replace advice given to you by your health care provider. Make sure you discuss any questions you have with your health care provider.   Document Released: 02/22/2005 Document Revised: 02/27/2013 Document Reviewed: 07/28/2012 Elsevier Interactive Patient Education [employer].

## 2014-12-19 NOTE — ED Provider Notes (Signed)
[facility] Emergency Department Provider Note  ____________________________________________  Time seen: Approximately 6:44 PM  I have reviewed the triage vital signs and the nursing notes.   HISTORY  Chief Complaint Weakness and Dizziness    HPI Allison Snyder is a 81 y.o. female who takes no medications and has a history only of mild asthma and occasional vertigo (her diagnosis, not "official") who presents with several days of feeling "not quite right".  She has felt "dizzy" and weak, usually when standing up from a seated or lying position.  She feels better again when she sits or lies back down.  She denies headache, nausea, vomiting, chest pain, shortness of breath, abdominal pain, fever/chills.   Past Medical History  Diagnosis Date  . Asthma   . History of appendectomy   . H/O tubal ligation     Patient Active Problem List   Diagnosis Date Noted  . Breast mass, left 12/11/2014  . Bilateral renal cysts 09/23/2014  . Asthma, mild persistent 07/27/2014  . Aortic atherosclerosis ([place]) 07/27/2014  . Neoplasm of uncertain behavior of skin 07/27/2014  . Impaired renal function 07/27/2014    Past Surgical History  Procedure Laterality Date  . Abdominal hysterectomy    . Tubal ligation      Current Outpatient Rx  Name  Route  Sig  Dispense  Refill  . budesonide (PULMICORT) 0.5 MG/2ML nebulizer solution   Nebulization   Take 0.5 mg by nebulization 2 (two) times daily.           Allergies Levaquin; Prednisone; and Albuterol  Family History  Problem Relation Age of Onset  . Colon cancer Mother   . Diabetes Sister     Social History Social History  Substance Use Topics  . Smoking status: Never Smoker   . Smokeless tobacco: None  . Alcohol Use: No    Review of Systems Constitutional: No fever/chills Eyes: No visual changes. ENT: No sore throat. Cardiovascular: Denies chest pain. Respiratory: Denies shortness of  breath. Gastrointestinal: No abdominal pain.  No nausea, no vomiting.  No diarrhea.  No constipation. Genitourinary: Negative for dysuria. Musculoskeletal: Negative for back pain. Skin: Negative for rash. Neurological: Negative for headaches, focal weakness or numbness.  Occasional generalized weakness/"dizziness", sometimes some difficulty with balance after she immediately stands up and tries to walk  10-point ROS otherwise negative.  ____________________________________________   PHYSICAL EXAM:  VITAL SIGNS: ED Triage Vitals  Enc Vitals Group     BP 12/19/14 1441 145/47 mmHg     Pulse Rate 12/19/14 1441 67     Resp 12/19/14 1441 16     Temp 12/19/14 1441 97.8 F (36.6 C)     Temp src --      SpO2 12/19/14 1441 96 %     Weight 12/19/14 1441 160 lb (72.576 kg)     Height --      Head Cir --      Peak Flow --      Pain Score --      Pain Loc --      Pain Edu? --      Excl. in [place]? --     Constitutional: Alert and oriented. Well appearing and in no acute distress. Eyes: Conjunctivae are normal. PERRL. EOMI. Head: Atraumatic. Nose: No congestion/rhinnorhea. Mouth/Throat: Mucous membranes are moist.  Oropharynx non-erythematous. Neck: No stridor.   Cardiovascular: Normal rate, regular rhythm. Grossly normal heart sounds.  Good peripheral circulation. Respiratory: Normal respiratory effort.  No retractions.  Lungs CTAB. Gastrointestinal: Soft and nontender. No distention. No abdominal bruits. No CVA tenderness. Musculoskeletal: No lower extremity tenderness nor edema.  No joint effusions. Neurologic:  Normal speech and language. No gross focal neurologic deficits are appreciated.  Normal gait.  No dysmetria.  Skin:  Skin is warm, dry and intact. No rash noted. Psychiatric: Mood and affect are normal. Speech and behavior are normal.  ____________________________________________   LABS (all labs ordered are listed, but only abnormal results are displayed)  Labs Reviewed   BASIC METABOLIC PANEL - Abnormal; Notable for the following:    Glucose, Bld 101 (*)    All other components within normal limits  URINALYSIS COMPLETEWITH MICROSCOPIC ([facility] ONLY) - Abnormal; Notable for the following:    Color, Urine STRAW (*)    APPearance CLEAR (*)    Leukocytes, UA 1+ (*)    Squamous Epithelial / LPF 0-5 (*)    All other components within normal limits  CBC  GLUCOSE, CAPILLARY  CBG MONITORING, ED   ____________________________________________  EKG  ED ECG REPORT I, Ceniya Fowers, the attending physician, personally viewed and interpreted this ECG.   Date: 12/19/2014  EKG Time: 14:46  Rate: 67  Rhythm: normal sinus rhythm  Axis: Normal  Intervals:right bundle branch block  ST&T Change: Non-specific ST segment / T-wave changes, but no evidence of acute ischemia. The morphology of the EKG is similar to prior with no significant changes.  ____________________________________________  RADIOLOGY   Ct Head Wo Contrast  12/19/2014  CLINICAL DATA:  81 year old female with dizziness and weakness. Initial encounter. EXAM: CT HEAD WITHOUT CONTRAST TECHNIQUE: Contiguous axial images were obtained from the base of the skull through the vertex without intravenous contrast. COMPARISON:  06/09/2006. FINDINGS: Visualized paranasal sinuses and mastoids are clear. Tympanic cavities appear clear. Visualized orbits and scalp soft tissues are within normal limits. No acute osseous abnormality identified. Cerebral volume is within normal limits for age. Calcified atherosclerosis at the skull base. No ventriculomegaly. No midline shift, mass effect, or evidence of intracranial mass lesion. Mild for age cerebral white matter hypodensity occasionally noted bilaterally, mostly subcortical. Otherwise normal gray-white matter differentiation. No cortically based acute infarct identified. No suspicious intracranial vascular hyperdensity. No acute intracranial hemorrhage identified.  IMPRESSION: No acute intracranial abnormality. Mild for age white matter changes most commonly due to small vessel disease. Electronically Signed   By: Genevie Ann M.D.   On: 12/19/2014 19:23    ____________________________________________   PROCEDURES  Procedure(s) performed: None  Critical Care performed: No ____________________________________________   INITIAL IMPRESSION / ASSESSMENT AND PLAN / ED COURSE  Pertinent labs & imaging results that were available during my care of the patient were reviewed by me and considered in my medical decision making (see chart for details).  The patient is well-appearing and has no acute abnormalities on exam or vitals.  She has not had a CT scan of her head and given that the symptoms have been going on for several days, if she had a significant infarction, it likely would show up at this point.  I will check a CT scan of the head, urinalysis, and her labs which are reassuring.  I anticipate that she will likely go home for outpatient follow-up.  ----------------------------------------- 8:18 PM on 12/19/2014 -----------------------------------------  The patient does have orthostatic changes from sitting to standing.  However, though she does have a 20 point systolic drop, her pressure remains normotensive.  However I believe this is enough to cause her to be symptomatic.  I offered IV fluids but she prefers increase by mouth intake.  I am awaiting a urinalysis and then anticipate discharge and outpatient follow-up.  The patient agrees with this plan.  ____________________________________________  FINAL CLINICAL IMPRESSION(S) / ED DIAGNOSES  Final diagnoses:  Orthostatic dizziness      NEW MEDICATIONS STARTED DURING THIS VISIT:  New Prescriptions   No medications on file     Hinda Kehr, MD 12/19/14 2044

## 2014-12-19 NOTE — ED Notes (Signed)
Pt here for dizziness, weakness, "feeling funny"  Reports hx of irregular heartbeat.

## 2014-12-20 DIAGNOSIS — R42 Dizziness and giddiness: Secondary | ICD-10-CM | POA: Diagnosis not present

## 2014-12-23 ENCOUNTER — Encounter: Payer: Self-pay | Admitting: Internal Medicine

## 2014-12-23 ENCOUNTER — Ambulatory Visit (INDEPENDENT_AMBULATORY_CARE_PROVIDER_SITE_OTHER): Payer: Medicare Other | Admitting: Internal Medicine

## 2014-12-23 VITALS — BP 138/64 | HR 68 | Ht 64.0 in | Wt 163.8 lb

## 2014-12-23 DIAGNOSIS — R55 Syncope and collapse: Secondary | ICD-10-CM

## 2014-12-23 DIAGNOSIS — J4530 Mild persistent asthma, uncomplicated: Secondary | ICD-10-CM

## 2014-12-23 MED ORDER — BUDESONIDE 0.5 MG/2ML IN SUSP: 0.5000 mg | Freq: Two times a day (BID) | RESPIRATORY_TRACT | Status: DC

## 2014-12-23 MED ORDER — FULL KIT NEBULIZER SET MISC: 1.0000 | Freq: Two times a day (BID) | Status: DC

## 2014-12-23 NOTE — Progress Notes (Signed)
Date:  12/23/2014   Name:  Allison Snyder   DOB:  Jul 13, 1929   MRN:  [id number]   Chief Complaint: Dizziness  Patient is here today for hospital follow-up. She was seen about 2 weeks ago with orthostatic dizziness and lightheadedness. She was referred to cardiology the next day who recommended a Holter monitor. According to the patient the Holter monitor was done and now she is waiting for an echo to be scheduled. 4 days ago she went to the emergency room with the same symptoms. She was found to be orthostatic. Blood work was done which was essentially normal. CT head was done which was normal for age. She was discharged home with no change in therapy. She reports feeling fairly well currently. No severe symptoms such as previously. No chest pains or shortness of breath. She continues on Pulmicort via nebulizer for her asthma.  EXAM: CT HEAD WITHOUT CONTRAST  TECHNIQUE: Contiguous axial images were obtained from the base of the skull through the vertex without intravenous contrast.  COMPARISON: 06/09/2006.  FINDINGS: Visualized paranasal sinuses and mastoids are clear. Tympanic cavities appear clear. Visualized orbits and scalp soft tissues are within normal limits. No acute osseous abnormality identified.  Cerebral volume is within normal limits for age. Calcified atherosclerosis at the skull base. No ventriculomegaly. No midline shift, mass effect, or evidence of intracranial mass lesion. Mild for age cerebral white matter hypodensity occasionally noted bilaterally, mostly subcortical. Otherwise normal gray-white matter differentiation. No cortically based acute infarct identified. No suspicious intracranial vascular hyperdensity. No acute intracranial hemorrhage identified.  IMPRESSION: No acute intracranial abnormality. Mild for age white matter changes most commonly due to small vessel disease.   Electronically Signed  By: Genevie Ann M.D.  On: 12/19/2014  19:23  Procedure: 24 hour Holter monitor  Indication: Dizzyness  Ordering Provider:  Dr. Ubaldo Glassing Interpreting Physician:  Dr. Ubaldo Glassing  Clinical History: 81 y.o. year old female  Hookup Date/Time:  12/12/2014/       Total Duration Recorded: 24 hours 52 minutes  Findings: The minimum heart rate was approximately 55 bpm  The maximum heart rate was approximately 97 bpm  The average heart rate was 66 bpm.   Approximately 13 premature ventricular beat (s) were noted There were 0 ventricular run(s) noted  Approximately 6000 supraventricular beat(s) were noted There were 0 SVT run(s) noted  Reported symptoms:  No patient events orDiary submitted  IMPRESSION:  Predominant rhythm was sinus rhythm.  There were no pauses.  There were no sustained tachy or bradyarrhythmias.  No symptoms reported.  Sydnee Levans, MD  Review of Systems  Constitutional: Positive for fatigue.  HENT: Negative for hearing loss.   Eyes: Negative for visual disturbance.  Respiratory: Positive for shortness of breath. Negative for cough, choking, chest tightness and wheezing.   Cardiovascular: Negative for chest pain and palpitations.  Gastrointestinal: Negative for abdominal pain.  Musculoskeletal: Negative for neck stiffness.  Neurological: Positive for light-headedness. Negative for tremors and syncope.  Psychiatric/Behavioral: Negative for dysphoric mood. The patient is not nervous/anxious.     Patient Active Problem List   Diagnosis Date Noted  . Breast mass, left 12/11/2014  . Bilateral renal cysts 09/23/2014  . Asthma, mild persistent 07/27/2014  . Aortic atherosclerosis ([place]) 07/27/2014  . Neoplasm of uncertain behavior of skin 07/27/2014  . Impaired renal function 07/27/2014  . Urinary system disease 07/27/2014    Prior to Admission medications   Medication Sig Start Date End Date Taking? Authorizing Provider  budesonide (PULMICORT)  0.5 MG/2ML nebulizer solution Take 0.5 mg by nebulization 2  (two) times daily.   Yes Historical Provider, MD    Allergies  Allergen Reactions  . Levaquin [Levofloxacin] Shortness Of Breath  . Prednisone Shortness Of Breath  . Albuterol Anxiety    Past Surgical History  Procedure Laterality Date  . Abdominal hysterectomy    . Tubal ligation      Social History  Substance Use Topics  . Smoking status: Never Smoker   . Smokeless tobacco: None  . Alcohol Use: No    Medication list has been reviewed and updated.   Physical Exam  Constitutional: She is oriented to person, place, and time. She appears well-developed and well-nourished.  Neck: Normal range of motion. No thyromegaly present.  Cardiovascular: Normal rate, regular rhythm and normal heart sounds.   Pulmonary/Chest: Effort normal and breath sounds normal. She has no wheezes.  Musculoskeletal: Normal range of motion. She exhibits no tenderness.  Neurological: She is alert and oriented to person, place, and time.  Skin: Skin is warm and dry.  Nursing note and vitals reviewed.   BP 138/64 mmHg  Pulse 68  Ht _0  (1.626 m)  Wt 163 lb 12.8 oz (74.299 kg)  BMI 28.10 kg/m2  Assessment and Plan: 1. Pre-syncope She is having persistent intermittent symptoms likely related to cardiac arrhythmia.  She was encouraged to continue fluid intake along with adequate sodium and follow-up with Dr. Ubaldo Glassing as instructed for Echo and then an office visit.  2. Asthma, mild persistent, uncomplicated - budesonide (PULMICORT) 0.5 MG/2ML nebulizer solution; Take 2 mLs (0.5 mg total) by nebulization 2 (two) times daily.  Dispense: 240 mL; Refill: 5 - Respiratory Therapy Supplies (FULL KIT NEBULIZER SET) MISC; 1 each by Does not apply route 2 (two) times daily.  Dispense: 1 each; Refill: 0   Halina Maidens, MD [facility]  12/23/2014

## 2014-12-24 ENCOUNTER — Emergency Department
Admission: EM | Admit: 2014-12-24 | Discharge: 2014-12-24 | Disposition: A | Discharge status: Home / Self Care | Payer: Medicare Other | Attending: Emergency Medicine | Admitting: Emergency Medicine

## 2014-12-24 DIAGNOSIS — R079 Chest pain, unspecified: Secondary | ICD-10-CM | POA: Diagnosis present

## 2014-12-24 DIAGNOSIS — Z79899 Other long term (current) drug therapy: Secondary | ICD-10-CM | POA: Insufficient documentation

## 2014-12-24 DIAGNOSIS — R0789 Other chest pain: Secondary | ICD-10-CM | POA: Insufficient documentation

## 2014-12-24 DIAGNOSIS — Z7952 Long term (current) use of systemic steroids: Secondary | ICD-10-CM | POA: Insufficient documentation

## 2014-12-24 DIAGNOSIS — R002 Palpitations: Secondary | ICD-10-CM | POA: Diagnosis not present

## 2014-12-24 DIAGNOSIS — E860 Dehydration: Secondary | ICD-10-CM | POA: Insufficient documentation

## 2014-12-24 LAB — COMPREHENSIVE METABOLIC PANEL
ALT: 12 U/L — ABNORMAL LOW (ref 14–54)
ANION GAP: 7 (ref 5–15)
AST: 22 U/L (ref 15–41)
Albumin: 3.5 g/dL (ref 3.5–5.0)
Alkaline Phosphatase: 76 U/L (ref 38–126)
BILIRUBIN TOTAL: 0.6 mg/dL (ref 0.3–1.2)
BUN: 17 mg/dL (ref 6–20)
CALCIUM: 9.1 mg/dL (ref 8.9–10.3)
CO2: 32 mmol/L (ref 22–32)
Chloride: 104 mmol/L (ref 101–111)
Creatinine, Ser: 0.64 mg/dL (ref 0.44–1.00)
GFR calc Af Amer: >60 mL/min (ref >60)
Glucose, Bld: 85 mg/dL (ref 65–99)
POTASSIUM: 3.6 mmol/L (ref 3.5–5.1)
Sodium: 143 mmol/L (ref 135–145)
TOTAL PROTEIN: 6.5 g/dL (ref 6.5–8.1)

## 2014-12-24 LAB — TROPONIN I

## 2014-12-24 LAB — CBC
HEMATOCRIT: 40 % (ref 35.0–47.0)
Hemoglobin: 13.7 g/dL (ref 12.0–16.0)
MCH: 30.6 pg (ref 26.0–34.0)
MCHC: 34.3 g/dL (ref 32.0–36.0)
MCV: 89.2 fL (ref 80.0–100.0)
Platelets: 176 10*3/uL (ref 150–440)
RBC: 4.49 MIL/uL (ref 3.80–5.20)
RDW: 12.6 % (ref 11.5–14.5)
WBC: 6.2 10*3/uL (ref 3.6–11.0)

## 2014-12-24 LAB — MAGNESIUM: Magnesium: 2.3 mg/dL (ref 1.7–2.4)

## 2014-12-24 LAB — TSH: TSH: 1.52 u[IU]/mL (ref 0.350–4.500)

## 2014-12-24 NOTE — ED Notes (Signed)
Pt in with co chest racing tonight, states no pain,  No shob, no diaphoresis.  Pt states no nausea, states was here Thursday for weakness and all tests were normal was told to follow up with pmd was found to have "erratic heart rate".  Saw pmd yesterday and was told her bp was low to "eat salt".

## 2014-12-24 NOTE — ED Provider Notes (Signed)
[facility] Emergency Department Provider Note  ____________________________________________  Time seen: Approximately 351 AM  I have reviewed the triage vital signs and the nursing notes.   HISTORY  Chief Complaint Chest Pain    HPI Allison Snyder is a 81 y.o. female who comes into the hospital today with some palpitations. The patient reports that she couldn't sleep and she felt her heart beating out of rhythm. She reports that her doctor told her that her heart was beating out of rhythm but she had never felt before. The patient also reports that she had 2-3 pains in her chest and she became concerned so she decided to come in for evaluation. The patient reports that she has never been sick and has never had any problems so she did not like the idea of having these pains and feeling her heart beating out of rhythm. The patient reports that she thinks she may have ate too much and drank some water due to dehydration. The patient reports that her husband had passed away and she had not been eating and drinking well. She had lost some significant amount of weight and was seen here previously for dehydration. The patient denies any shortness of breath denies nausea vomiting or abdominal pain no headache.   Past Medical History  Diagnosis Date  . Asthma   . History of appendectomy   . H/O tubal ligation     Patient Active Problem List   Diagnosis Date Noted  . Breast mass, left 12/11/2014  . Bilateral renal cysts 09/23/2014  . Asthma, mild persistent 07/27/2014  . Aortic atherosclerosis ([place]) 07/27/2014  . Neoplasm of uncertain behavior of skin 07/27/2014  . Impaired renal function 07/27/2014  . Urinary system disease 07/27/2014    Past Surgical History  Procedure Laterality Date  . Abdominal hysterectomy    . Tubal ligation      Current Outpatient Rx  Name  Route  Sig  Dispense  Refill  . budesonide (PULMICORT) 0.5 MG/2ML nebulizer solution  Nebulization   Take 2 mLs (0.5 mg total) by nebulization 2 (two) times daily.   240 mL   5   . Respiratory Therapy Supplies (FULL KIT NEBULIZER SET) MISC   Does not apply   1 each by Does not apply route 2 (two) times daily.   1 each   0     Allergies Levaquin; Prednisone; and Albuterol  Family History  Problem Relation Age of Onset  . Colon cancer Mother   . Diabetes Sister     Social History Social History  Substance Use Topics  . Smoking status: Never Smoker   . Smokeless tobacco: Not on file  . Alcohol Use: No    Review of Systems Constitutional: No fever/chills Eyes: No visual changes. ENT: No sore throat. Cardiovascular: Palpitations and chest pain Respiratory: Denies shortness of breath. Gastrointestinal: No abdominal pain.  No nausea, no vomiting.  No diarrhea.  No constipation. Genitourinary: Negative for dysuria. Musculoskeletal: Negative for back pain. Skin: Negative for rash. Neurological: Negative for headaches, focal weakness or numbness. 10-point ROS otherwise negative.  ____________________________________________   PHYSICAL EXAM:  VITAL SIGNS: ED Triage Vitals  Enc Vitals Group     BP 12/24/14 0243 137/54 mmHg     Pulse Rate 12/24/14 0243 64     Resp 12/24/14 0243 18     Temp 12/24/14 0243 98 F (36.7 C)     Temp Source 12/24/14 0243 Oral     SpO2 12/24/14 0243  95 %     Weight 12/24/14 0243 160 lb (72.576 kg)     Height 12/24/14 0243 5' 4"  (1.626 m)     Head Cir --      Peak Flow --      Pain Score --      Pain Loc --      Pain Edu? --      Excl. in [place]? --     Constitutional: Alert and oriented. Well appearing and in no acute distress. Eyes: Conjunctivae are normal. PERRL. EOMI. Head: Atraumatic. Nose: No congestion/rhinnorhea. Mouth/Throat: Mucous membranes are moist.  Oropharynx non-erythematous. Cardiovascular: Normal rate, regular rhythm. Grossly normal heart sounds.  Good peripheral circulation. Respiratory: Normal  respiratory effort.  No retractions. Lungs CTAB. Gastrointestinal: Soft and nontender. No distention. Positive bowel sounds Musculoskeletal: No lower extremity tenderness nor edema.  No joint effusions. Neurologic:  Normal speech and language.  Skin:  Skin is warm, dry and intact.  Psychiatric: Mood and affect are normal.   ____________________________________________   LABS (all labs ordered are listed, but only abnormal results are displayed)  Labs Reviewed  COMPREHENSIVE METABOLIC PANEL - Abnormal; Notable for the following:    ALT 12 (*)    All other components within normal limits  CBC  TROPONIN I  TSH  MAGNESIUM  TROPONIN I   ____________________________________________  EKG  ED ECG REPORT I, Loney Hering, the attending physician, personally viewed and interpreted this ECG.   Date: 12/24/2014  EKG Time: 240  Rate: 64  Rhythm: normal sinus rhythm, PAC's noted  Axis: normal  Intervals:right bundle branch block  ST&T Change: flipped t wave lead III  ____________________________________________  RADIOLOGY  None ____________________________________________   PROCEDURES  Procedure(s) performed: None  Critical Care performed: No  ____________________________________________   INITIAL IMPRESSION / ASSESSMENT AND PLAN / ED COURSE  Pertinent labs & imaging results that were available during my care of the patient were reviewed by me and considered in my medical decision making (see chart for details).  This is an 81 year old female who comes in today with some palpitations. The patient reports that her doctor has told her that she has these palpitations but she did not like feeling them. I added a magnesium as well as a TSH onto the patient's blood work to determine if she had some Alexa abnormality causing these symptoms and they were negative. The patient was also seen previously and her evaluation was unremarkable with the caveat of orthostatic  hypotension due to dehydration. The patient will be discharged to home to follow back up with her primary care physician. ____________________________________________   FINAL CLINICAL IMPRESSION(S) / ED DIAGNOSES  Final diagnoses:  Palpitations  Chest discomfort      Loney Hering, MD 12/24/14 [phone]

## 2014-12-24 NOTE — ED Notes (Signed)
Pt in with co "feeling funny" in chest since tonight, unable to sleep.

## 2014-12-24 NOTE — Discharge Instructions (Signed)

## 2015-01-14 ENCOUNTER — Encounter: Payer: Self-pay | Admitting: Internal Medicine

## 2015-01-14 ENCOUNTER — Ambulatory Visit (INDEPENDENT_AMBULATORY_CARE_PROVIDER_SITE_OTHER): Payer: Medicare Other | Admitting: Internal Medicine

## 2015-01-14 VITALS — BP 138/76 | HR 66 | Temp 98.1°F | Ht 64.0 in | Wt 163.0 lb

## 2015-01-14 DIAGNOSIS — I499 Cardiac arrhythmia, unspecified: Secondary | ICD-10-CM | POA: Diagnosis not present

## 2015-01-14 DIAGNOSIS — J4530 Mild persistent asthma, uncomplicated: Secondary | ICD-10-CM

## 2015-01-14 DIAGNOSIS — J069 Acute upper respiratory infection, unspecified: Secondary | ICD-10-CM | POA: Diagnosis not present

## 2015-01-14 DIAGNOSIS — I700 Atherosclerosis of aorta: Secondary | ICD-10-CM | POA: Diagnosis not present

## 2015-01-14 NOTE — Progress Notes (Signed)
Date:  01/14/2015   Name:  Allison Snyder   DOB:  1929-07-29   MRN:  [id number]   Chief Complaint: Cough  Patient complains of onset of sneezing 3 days ago. Then she developed a slight sore throat and some hoarseness. Her shortness of breath is no worse than usual. She denies any chest wall pains or pleuritic type pains. She denies fever or chills or wheezing. She coughs intermittently but does not know if it produces thick yellow or clear phlegm. She has not been using her Pulmicort nebulizer. Cardiac arrhythmia - patient has been seen by cardiology. Holter monitor was placed which was unremarkable. It appears that she just has frequent PVCs versus PACs. She's been reassured that it is not a life-threatening condition. She denies any dizziness, chest pains or lightheadedness. She is now taking care of her 2 small great grandchildren full-time again.  Review of Systems  Constitutional: Negative for fever, chills, fatigue and unexpected weight change.  HENT: Positive for postnasal drip, sneezing and sore throat. Negative for ear pain.   Eyes: Negative for visual disturbance.  Respiratory: Positive for cough. Negative for choking, chest tightness, shortness of breath and wheezing.   Cardiovascular: Negative for chest pain and palpitations.  Genitourinary: Negative for dysuria.  Skin: Negative for color change.  Hematological: Negative for adenopathy.  Psychiatric/Behavioral: The patient is not nervous/anxious.     Patient Active Problem List   Diagnosis Date Noted  . Breast mass, left 12/11/2014  . Bilateral renal cysts 09/23/2014  . Asthma, mild persistent 07/27/2014  . Aortic atherosclerosis ([place]) 07/27/2014  . Neoplasm of uncertain behavior of skin 07/27/2014  . Impaired renal function 07/27/2014  . Urinary system disease 07/27/2014    Prior to Admission medications   Medication Sig Start Date End Date Taking? Authorizing Provider  budesonide (PULMICORT) 0.5 MG/2ML nebulizer  solution Take 2 mLs (0.5 mg total) by nebulization 2 (two) times daily. Patient not taking: Reported on 01/14/2015 12/23/14   Glean Hess, MD  Respiratory Therapy Supplies (FULL KIT NEBULIZER SET) MISC 1 each by Does not apply route 2 (two) times daily. Patient not taking: Reported on 01/14/2015 12/23/14   Glean Hess, MD    Allergies  Allergen Reactions  . Levaquin [Levofloxacin] Shortness Of Breath  . Prednisone Shortness Of Breath  . Albuterol Anxiety    Past Surgical History  Procedure Laterality Date  . Abdominal hysterectomy    . Tubal ligation      Social History  Substance Use Topics  . Smoking status: Never Smoker   . Smokeless tobacco: None  . Alcohol Use: No    Medication list has been reviewed and updated.   Physical Exam  Constitutional: She is oriented to person, place, and time. She appears well-developed and well-nourished. No distress.  HENT:  Head: Normocephalic and atraumatic.  Right Ear: Tympanic membrane normal.  Left Ear: Tympanic membrane and ear canal normal.  Mouth/Throat: Posterior oropharyngeal erythema present.  Eyes: Conjunctivae are normal. Right eye exhibits no discharge. Left eye exhibits no discharge. No scleral icterus.  Neck: Normal range of motion. Neck supple.  Cardiovascular: Normal rate, regular rhythm and normal heart sounds.   Pulmonary/Chest: Effort normal. No respiratory distress. She has rhonchi in the right upper field and the left upper field.  Musculoskeletal: Normal range of motion.  Lymphadenopathy:    She has no cervical adenopathy.  Neurological: She is alert and oriented to person, place, and time.  Skin: Skin is warm and dry. No  rash noted.  Psychiatric: She has a normal mood and affect. Her behavior is normal. Thought content normal.    BP 138/76 mmHg  Pulse 66  Temp(Src) 98.1 F (36.7 C)  Ht _0  (1.626 m)  Wt 163 lb (73.936 kg)  BMI 27.97 kg/m2  SpO2 97%  Assessment and Plan: 1. Viral  URI Continue sufficient fluids, Tylenol, and Zyrtec 10 mg once daily   2. Asthma, mild persistent, uncomplicated Resume Pulmicort nebulizer solution daily  3. Aortic atherosclerosis ([facility]) Currently under the care of cardiology  4. Cardiac arrhythmia, unspecified cardiac arrhythmia type Probably benign rhythm disturbance not causing active symptoms   Halina Maidens, MD [facility]  01/14/2015

## 2015-01-16 DIAGNOSIS — I700 Atherosclerosis of aorta: Secondary | ICD-10-CM | POA: Diagnosis not present

## 2015-01-16 DIAGNOSIS — R42 Dizziness and giddiness: Secondary | ICD-10-CM | POA: Diagnosis not present

## 2015-01-16 DIAGNOSIS — R0602 Shortness of breath: Secondary | ICD-10-CM | POA: Diagnosis not present

## 2015-02-09 ENCOUNTER — Emergency Department: Payer: Medicare Other

## 2015-02-09 ENCOUNTER — Emergency Department
Admission: EM | Admit: 2015-02-09 | Discharge: 2015-02-09 | Disposition: A | Discharge status: Home / Self Care | Payer: Medicare Other | Attending: Emergency Medicine | Admitting: Emergency Medicine

## 2015-02-09 DIAGNOSIS — J189 Pneumonia, unspecified organism: Secondary | ICD-10-CM

## 2015-02-09 DIAGNOSIS — R05 Cough: Secondary | ICD-10-CM | POA: Diagnosis not present

## 2015-02-09 DIAGNOSIS — J159 Unspecified bacterial pneumonia: Secondary | ICD-10-CM | POA: Insufficient documentation

## 2015-02-09 DIAGNOSIS — J45901 Unspecified asthma with (acute) exacerbation: Secondary | ICD-10-CM | POA: Diagnosis not present

## 2015-02-09 DIAGNOSIS — R0602 Shortness of breath: Secondary | ICD-10-CM | POA: Diagnosis present

## 2015-02-09 LAB — BASIC METABOLIC PANEL
Anion gap: 8 (ref 5–15)
BUN: 15 mg/dL (ref 6–20)
CALCIUM: 9.1 mg/dL (ref 8.9–10.3)
CHLORIDE: 106 mmol/L (ref 101–111)
CO2: 29 mmol/L (ref 22–32)
CREATININE: 0.61 mg/dL (ref 0.44–1.00)
GFR calc Af Amer: >60 mL/min (ref >60)
GFR calc non Af Amer: >60 mL/min (ref >60)
GLUCOSE: 101 mg/dL — AB (ref 65–99)
Potassium: 3.7 mmol/L (ref 3.5–5.1)
Sodium: 143 mmol/L (ref 135–145)

## 2015-02-09 LAB — CBC
HEMATOCRIT: 43.5 % (ref 35.0–47.0)
HEMOGLOBIN: 14.4 g/dL (ref 12.0–16.0)
MCH: 30.1 pg (ref 26.0–34.0)
MCHC: 33.1 g/dL (ref 32.0–36.0)
MCV: 90.9 fL (ref 80.0–100.0)
Platelets: 195 10*3/uL (ref 150–440)
RBC: 4.79 MIL/uL (ref 3.80–5.20)
RDW: 13.5 % (ref 11.5–14.5)
WBC: 6.8 10*3/uL (ref 3.6–11.0)

## 2015-02-09 LAB — TROPONIN I: Troponin I: <0.03 ng/mL (ref <0.031)

## 2015-02-09 MED ORDER — AZITHROMYCIN 250 MG PO TABS
500.0000 mg | ORAL_TABLET | Freq: Once | ORAL | Status: AC
  Administered 2015-02-09: 500 mg via ORAL
  Filled 2015-02-09: qty 2

## 2015-02-09 MED ORDER — AZITHROMYCIN 250 MG PO TABS: ORAL_TABLET | ORAL | Status: DC

## 2015-02-09 MED ORDER — IPRATROPIUM-ALBUTEROL 0.5-2.5 (3) MG/3ML IN SOLN
RESPIRATORY_TRACT | Status: AC
  Filled 2015-02-09: qty 3

## 2015-02-09 NOTE — ED Notes (Signed)
Pt says she is supposed to be taking 2 different daily inhalers but she has not been taking them recently d/t being busy caring for other family members. States her PCP did not place her on albuterol because it causes "jitters."

## 2015-02-09 NOTE — ED Provider Notes (Signed)
[facility] Emergency Department Provider Note REMINDER - THIS NOTE IS NOT A FINAL MEDICAL RECORD UNTIL IT IS SIGNED. UNTIL THEN, THE CONTENT BELOW MAY REFLECT INFORMATION FROM A DOCUMENTATION TEMPLATE, NOT THE ACTUAL PATIENT VISIT. ____________________________________________  Time seen: Approximately 12:25 PM  I have reviewed the triage vital signs and the nursing notes.   HISTORY  Chief Complaint Shortness of Breath    HPI Allison Snyder is a 81 y.o. female date history of asthma. She reports for the last 3 weeks she has been having a nonproductive cough. She reports it's been increasing. She saw her primary care doctor about 2 weeks ago, and was told that she did not need antibiotics at that time. She reports today while driving the car to church she felt short of breath, and began breathing very heavily. Her symptoms have resolved now without intervention. She does use albuterol, took one previous this morning.  She denies chills, but has felt warm at times. She has been having a nonproductive cough. Been ongoing for over 3 weeks.  No chest pain. No abdominal pain. No headache. The present time denies any wheezing or trouble breathing.  Past Medical History  Diagnosis Date  . Asthma   . History of appendectomy   . H/O tubal ligation     Patient Active Problem List   Diagnosis Date Noted  . Breast mass, left 12/11/2014  . Bilateral renal cysts 09/23/2014  . Asthma, mild persistent 07/27/2014  . Aortic atherosclerosis ([place]) 07/27/2014  . Neoplasm of uncertain behavior of skin 07/27/2014  . Impaired renal function 07/27/2014  . Urinary system disease 07/27/2014    Past Surgical History  Procedure Laterality Date  . Abdominal hysterectomy    . Tubal ligation      Current Outpatient Rx  Name  Route  Sig  Dispense  Refill  . azithromycin (ZITHROMAX Z-PAK) 250 MG tablet      1 tab daily starting 02/10/15   4 each   0   . budesonide  (PULMICORT) 0.5 MG/2ML nebulizer solution   Nebulization   Take 2 mLs (0.5 mg total) by nebulization 2 (two) times daily. Patient not taking: Reported on 01/14/2015   240 mL   5   . Respiratory Therapy Supplies (FULL KIT NEBULIZER SET) MISC   Does not apply   1 each by Does not apply route 2 (two) times daily. Patient not taking: Reported on 01/14/2015   1 each   0     Allergies Levaquin; Prednisone; and Albuterol  Family History  Problem Relation Age of Onset  . Colon cancer Mother   . Diabetes Sister     Social History Social History  Substance Use Topics  . Smoking status: Never Smoker   . Smokeless tobacco: None  . Alcohol Use: No    Review of Systems Constitutional: No fever/chills Eyes: No visual changes. ENT: No sore throat. Cardiovascular: Denies chest pain. Respiratory: See history of present illness Gastrointestinal: No abdominal pain.  No nausea, no vomiting.  No diarrhea.  No constipation. Genitourinary: Negative for dysuria. Musculoskeletal: Negative for back pain. Skin: Negative for rash. Neurological: Negative for headaches, focal weakness or numbness.  10-point ROS otherwise negative.  ____________________________________________   PHYSICAL EXAM:  VITAL SIGNS: ED Triage Vitals  Enc Vitals Group     BP 02/09/15 1032 123/72 mmHg     Pulse Rate 02/09/15 1032 78     Resp 02/09/15 1032 18     Temp 02/09/15 1032 98.2  F (36.8 C)     Temp Source 02/09/15 1032 Oral     SpO2 02/09/15 1032 91 %     Weight 02/09/15 1032 170 lb (77.111 kg)     Height 02/09/15 1032 $RemoveBefor'5\' 5"'[id number]$  (1.651 m)     Head Cir --      Peak Flow --      Pain Score --      Pain Loc --      Pain Edu? --      Excl. in [place]? --    Constitutional: Alert and oriented. Well appearing and in no acute distress. Eyes: Conjunctivae are normal. PERRL. EOMI. Head: Atraumatic. Nose: No congestion/rhinnorhea. Mouth/Throat: Mucous membranes are moist.  Oropharynx non-erythematous. Neck: No  stridor.   Cardiovascular: Normal rate, regular rhythm. Grossly normal heart sounds.  Good peripheral circulation. Respiratory: Normal respiratory effort.  No retractions. Lungs CTAB questionably some very minimal rales in the left lower lobe. No wheezing. Occasional dry cough. Gastrointestinal: Soft and nontender. No distention. No abdominal bruits. No CVA tenderness. Musculoskeletal: No lower extremity tenderness nor edema.  No joint effusions. Neurologic:  Normal speech and language. No gross focal neurologic deficits are appreciated. No gait instability. Skin:  Skin is warm, dry and intact. No rash noted. Psychiatric: Mood and affect are normal. Speech and behavior are normal.  ____________________________________________   LABS (all labs ordered are listed, but only abnormal results are displayed)  Labs Reviewed  BASIC METABOLIC PANEL - Abnormal; Notable for the following:    Glucose, Bld 101 (*)    All other components within normal limits  CBC  TROPONIN I   ____________________________________________  EKG  Reviewed and interpreted by me at 12 PM Normal sinus rhythm Heart rate 75 QTc 460 QRS 140 [place], right bundle-branch block. Minimal T-wave abnormality noted in lead 3. T-wave inversion in V3 expected given right bundle-branch block morphology.  No evidence of acute ST elevation MI or ischemia ____________________________________________  RADIOLOGY  DG Chest 2 View (Final result) Result time: 02/09/15 12:53:06   Final result by Rad Results In Interface (02/09/15 12:53:06)   Narrative:   CLINICAL DATA: Worsening cough for 3 weeks  EXAM: CHEST 2 VIEW  COMPARISON: 07/30/2014 chest radiograph.  FINDINGS: Stable cardiomediastinal silhouette with top-normal heart size. No pneumothorax. No pleural effusion. Mild patchy opacity at the left lung base. No pulmonary edema.  IMPRESSION: Mild patchy opacity at the left lung base, which could  represent atelectasis or developing pneumonia. Recommend follow-up PA and lateral post treatment chest radiographs in 6-8 weeks.    ____________________________________________   PROCEDURES  Procedure(s) performed: None  Critical Care performed: No  ____________________________________________   INITIAL IMPRESSION / ASSESSMENT AND PLAN / ED COURSE  Pertinent labs & imaging results that were available during my care of the patient were reviewed by me and considered in my medical decision making (see chart for details).  Patient transferred coughing episode today, she felt acutely short of breath. This is now resolved. This is in the setting of a diagnosed as bronchitis starting approximately 3 weeks ago. The present time she endorses no chest pain. No cardiac symptoms. EKG is reassuring, negative troponin. No evidence of increased work of breathing.   Chest x-ray indicates possible infiltrate in the left base, correlates well with her clinical symptoms. We will treat her with azithromycin for community acquired pneumonia. She denies any recent hospitalizations, she is not currently residing in a care facility.  ----------------------------------------- 2:06 PM on 02/09/2015 -----------------------------------------  No evidence of  complicated pneumonia or evidence to suggest a need for inpatient treatment at this time.  Patient resting comfortably. 98% on room air, respiration clearly and speaking in full sentences with no distress. Discussed diagnosis, antibiotic treatment, and careful return precautions and follow-up with her primary care doctor in the next 48 hours. Patient is agreeable, patient's granddaughter will be assisting her home. ____________________________________________   FINAL CLINICAL IMPRESSION(S) / ED DIAGNOSES  Final diagnoses:  Community acquired pneumonia      Delman Kitten, MD 02/09/15 1600

## 2015-02-09 NOTE — ED Notes (Signed)
Pt states she was on the way to church this AM and became SOB. Still is SOB. Pt hx of asthma, took breathing treatment this AM. Pt alert and oriented X4, active, cooperative, pt in NAD. RR even and unlabored, color WNL.

## 2015-02-09 NOTE — Discharge Instructions (Signed)
We believe that your symptoms are caused today by pneumonia, an infection in your lung(s).  Fortunately you should start to improve quickly after taking your antibiotics.  Please take the full course of antibiotics as prescribed and drink plenty of fluids.    Follow up with your doctor within 1-2 days.  If you develop any new or worsening symptoms, including but not limited to fever in spite of taking over-the-counter ibuprofen and/or Tylenol, persistent vomiting, worsening shortness of breath, or other symptoms that concern you, please return to the Emergency Department immediately.    Community-Acquired Pneumonia, Adult Pneumonia is an infection of the lungs. There are different types of pneumonia. One type can develop while a person is in a hospital. A different type, called community-acquired pneumonia, develops in people who are not, or have not recently been, in the hospital or other health care facility.  CAUSES Pneumonia may be caused by bacteria, viruses, or funguses. Community-acquired pneumonia is often caused by Streptococcus pneumonia bacteria. These bacteria are often passed from one person to another by breathing in droplets from the cough or sneeze of an infected person. RISK FACTORS The condition is more likely to develop in:  People who havechronic diseases, such as chronic obstructive pulmonary disease (COPD), asthma, congestive heart failure, cystic fibrosis, diabetes, or kidney disease.  People who haveearly-stage or late-stage HIV.  People who havesickle cell disease.  People who havehad their spleen removed (splenectomy).  People who havepoor Human resources officer.  People who havemedical conditions that increase the risk of breathing in (aspirating) secretions their own mouth and nose.   People who havea weakened immune system (immunocompromised).  People who smoke.  People whotravel to areas where pneumonia-causing germs commonly exist.  People whoare around  animal habitats or animals that have pneumonia-causing germs, including birds, bats, rabbits, cats, and farm animals. SYMPTOMS Symptoms of this condition include:  Adry cough.  A wet (productive) cough.  Fever.  Sweating.  Chest pain, especially when breathing deeply or coughing.  Rapid breathing or difficulty breathing.  Shortness of breath.  Shaking chills.  Fatigue.  Muscle aches. DIAGNOSIS Your health care provider will take a medical history and perform a physical exam. You may also have other tests, including:  Imaging studies of your chest, including X-rays.  Tests to check your blood oxygen level and other blood gases.  Other tests on blood, mucus (sputum), fluid around your lungs (pleural fluid), and urine. If your pneumonia is severe, other tests may be done to identify the specific cause of your illness. TREATMENT The type of treatment that you receive depends on many factors, such as the cause of your pneumonia, the medicines you take, and other medical conditions that you have. For most adults, treatment and recovery from pneumonia may occur at home. In some cases, treatment must happen in a hospital. Treatment may include:  Antibiotic medicines, if the pneumonia was caused by bacteria.  Antiviral medicines, if the pneumonia was caused by a virus.  Medicines that are given by mouth or through an IV tube.  Oxygen.  Respiratory therapy. Although rare, treating severe pneumonia may include:  Mechanical ventilation. This is done if you are not breathing well on your own and you cannot maintain a safe blood oxygen level.  Thoracentesis. This procedureremoves fluid around one lung or both lungs to help you breathe better. HOME CARE INSTRUCTIONS  Take over-the-counter and prescription medicines only as told by your health care provider.  Only takecough medicine if you are losing sleep.  Understand that cough medicine can prevent your body's natural ability  to remove mucus from your lungs.  If you were prescribed an antibiotic medicine, take it as told by your health care provider. Do not stop taking the antibiotic even if you start to feel better.  Sleep in a semi-upright position at night. Try sleeping in a reclining chair, or place a few pillows under your head.  Do not use tobacco products, including cigarettes, chewing tobacco, and e-cigarettes. If you need help quitting, ask your health care provider.  Drink enough water to keep your urine clear or pale yellow. This will help to thin out mucus secretions in your lungs. PREVENTION There are ways that you can decrease your risk of developing community-acquired pneumonia. Consider getting a pneumococcal vaccine if:  You are older than 79 years of age.  You are older than 79 years of age and are undergoing cancer treatment, have chronic lung disease, or have other medical conditions that affect your immune system. Ask your health care provider if this applies to you. There are different types and schedules of pneumococcal vaccines. Ask your health care provider which vaccination option is best for you. You may also prevent community-acquired pneumonia if you take these actions:  Get an influenza vaccine every year. Ask your health care provider which type of influenza vaccine is best for you.  Go to the dentist on a regular basis.  Wash your hands often. Use hand sanitizer if soap and water are not available. SEEK MEDICAL CARE IF:  You have a fever.  You are losing sleep because you cannot control your cough with cough medicine. SEEK IMMEDIATE MEDICAL CARE IF:  You have worsening shortness of breath.  You have increased chest pain.  Your sickness becomes worse, especially if you are an older adult or have a weakened immune system.  You cough up blood.   This information is not intended to replace advice given to you by your health care provider. Make sure you discuss any  questions you have with your health care provider.   Document Released: 02/22/2005 Document Revised: 11/13/2014 Document Reviewed: 06/19/2014 Elsevier Interactive Patient Education [employer].

## 2015-02-12 ENCOUNTER — Ambulatory Visit (INDEPENDENT_AMBULATORY_CARE_PROVIDER_SITE_OTHER): Payer: Medicare Other | Admitting: Internal Medicine

## 2015-02-12 ENCOUNTER — Encounter: Payer: Self-pay | Admitting: Internal Medicine

## 2015-02-12 VITALS — BP 142/62 | HR 80 | Ht 65.0 in | Wt 165.2 lb

## 2015-02-12 DIAGNOSIS — J40 Bronchitis, not specified as acute or chronic: Secondary | ICD-10-CM
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
## 2015-02-14 ENCOUNTER — Ambulatory Visit
Admission: EM | Admit: 2015-02-14 | Discharge: 2015-02-14 | Disposition: A | Discharge status: Home / Self Care | Payer: Medicare Other | Attending: Family Medicine | Admitting: Family Medicine

## 2015-02-14 ENCOUNTER — Encounter: Payer: Self-pay | Admitting: Emergency Medicine

## 2015-02-14 DIAGNOSIS — J40 Bronchitis, not specified as acute or chronic: Secondary | ICD-10-CM

## 2015-02-14 DIAGNOSIS — R0600 Dyspnea, unspecified: Secondary | ICD-10-CM | POA: Diagnosis not present

## 2015-02-14 NOTE — ED Notes (Signed)
Patient cough and chest congestion for 4 weeks.  Patient states that she was seen at ER on Sunday and started on Z-Pack and had a chest x-ray.  Patient denies fevers.

## 2015-02-14 NOTE — ED Provider Notes (Signed)
CSN: [id number]     Arrival date & time 02/14/15  1434 History   First MD Initiated Contact with Patient 02/14/15 1630     Chief Complaint  Patient presents with  . Cough   (Consider location/radiation/quality/duration/timing/severity/associated sxs/prior Treatment) HPI  This 81 year old female who presents with a cough and congestion for 4 weeks. Was seen at the [facility] ED about 4 days ago where she was diagnosed with a possible left lower lobe infiltrate placed on a Z-Pak for community-acquired pneumonia. Follow-up with her primary care doctor Dr. Army Melia 2 days ago reviewed her x-ray and stated that there was not significant change from previously and dilated the possibility of a pneumonia diagnosed her with bronchitis. She continue taking the azithromycin.She is using her nebulizer intermittently complains that the medicine cup portion is broken and leaking needs to be replaced. Today she began to have some heavy breathing, became alarmed and presented here. When I saw her in the room she was resting very comfortably afebrile pulse 63 respirations 18 and SPO2 on room air 98%. She wanted to know how long the azithromycin last visit her body. She was having no respiratory distress whatsoever. Was completely lucid and oriented. Not complaining of any shortness of breath chest pain cough or sinus drainage while in our office. Past Medical History  Diagnosis Date  . Asthma   . History of appendectomy   . H/O tubal ligation    Past Surgical History  Procedure Laterality Date  . Abdominal hysterectomy    . Tubal ligation     Family History  Problem Relation Age of Onset  . Colon cancer Mother   . Diabetes Sister    Social History  Substance Use Topics  . Smoking status: Never Smoker   . Smokeless tobacco: None  . Alcohol Use: No   OB History    No data available     Review of Systems  Constitutional: Positive for fatigue. Negative for fever, chills and diaphoresis.  Respiratory:  Positive for shortness of breath.   Skin: Negative for color change, pallor and rash.  All other systems reviewed and are negative.   Allergies  Levaquin; Prednisone; and Albuterol  Home Medications   Prior to Admission medications   Medication Sig Start Date End Date Taking? Authorizing Provider  azithromycin (ZITHROMAX Z-PAK) 250 MG tablet 1 tab daily starting 02/10/15 02/09/15   Delman Kitten, MD  budesonide (PULMICORT) 0.5 MG/2ML nebulizer solution Take 2 mLs (0.5 mg total) by nebulization 2 (two) times daily. 12/23/14   Glean Hess, MD  Respiratory Therapy Supplies (FULL KIT NEBULIZER SET) MISC 1 each by Does not apply route 2 (two) times daily. 12/23/14   Glean Hess, MD   Meds Ordered and Administered this Visit  Medications - No data to display  BP 146/60 mmHg  Pulse 63  Temp(Src) 97 F (36.1 C) (Tympanic)  Resp 18  Ht _0  (1.651 m)  Wt 157 lb (71.215 kg)  BMI 26.13 kg/m2  SpO2 98% No data found.   Physical Exam  Constitutional: She is oriented to person, place, and time. She appears well-developed and well-nourished. No distress.  HENT:  Head: Normocephalic and atraumatic.  Eyes: Conjunctivae are normal. Pupils are equal, round, and reactive to light.  Neck: Normal range of motion. Neck supple.  Pulmonary/Chest: Effort normal and breath sounds normal. No respiratory distress. She has no wheezes. She has no rales.  Musculoskeletal: Normal range of motion. She exhibits no edema or tenderness.  Neurological:  She is alert and oriented to person, place, and time.  Skin: Skin is warm and dry. No rash noted. She is not diaphoretic. No erythema. No pallor.  Psychiatric: She has a normal mood and affect. Her behavior is normal. Judgment and thought content normal.  Nursing note and vitals reviewed.   ED Course  Procedures (including critical care time)  Labs Review Labs Reviewed - No data to display  Imaging Review No results found.   Visual Acuity  Review  Right Eye Distance:   Left Eye Distance:   Bilateral Distance:    Right Eye Near:   Left Eye Near:    Bilateral Near:         MDM   1. Dyspnea   2. Bronchitis    The patient was reassured that there does not appear to be any reason for her to be having her shortness of breath and maybe her anxiety which she agreed. He states this point time is much better is not having any shortness of breath. All of her questions were answered. Dr. Army Melia will continue to see her and told patient if she has any questions or problems she should correct this to Dr. Army Melia next week. However she has increasing shortness of breath chest pain fever or sputum production or any change in her current status she should call 911 and be seen in the emergency department. She understands and acknowledges.    Lorin Picket, PA-C 02/14/15 1707  Lorin Picket, PA-C 02/14/15 2201

## 2015-02-14 NOTE — Discharge Instructions (Signed)

## 2015-02-19 ENCOUNTER — Other Ambulatory Visit: Payer: Self-pay | Admitting: Internal Medicine

## 2015-02-19 ENCOUNTER — Encounter: Payer: Self-pay | Admitting: Emergency Medicine

## 2015-02-19 ENCOUNTER — Ambulatory Visit: Admission: RE | Admit: 2015-02-19 | Payer: Medicare Other | Source: Ambulatory Visit

## 2015-02-19 ENCOUNTER — Emergency Department
Admission: EM | Admit: 2015-02-19 | Discharge: 2015-02-19 | Discharge status: Against Medical Advice | Payer: Medicare Other | Attending: Emergency Medicine | Admitting: Emergency Medicine

## 2015-02-19 ENCOUNTER — Emergency Department: Payer: Medicare Other

## 2015-02-19 DIAGNOSIS — R062 Wheezing: Secondary | ICD-10-CM | POA: Diagnosis present

## 2015-02-19 DIAGNOSIS — J449 Chronic obstructive pulmonary disease, unspecified: Secondary | ICD-10-CM

## 2015-02-19 DIAGNOSIS — J45901 Unspecified asthma with (acute) exacerbation: Secondary | ICD-10-CM | POA: Insufficient documentation

## 2015-02-19 NOTE — ED Notes (Signed)
Attempted to call patient x2. No answer.

## 2015-02-19 NOTE — ED Notes (Signed)
Attempted to call patient x1.

## 2015-02-19 NOTE — ED Notes (Signed)
Explained that her oxygen level is good at this time but she may need another breathing tx. Pt state "if i am not gonna get oxygen i shouldn't have come". Explained that not sure what she will get in back but she does not need oxygen at this time.

## 2015-02-19 NOTE — ED Notes (Addendum)
Pt c/o shallow breathing today; denies actually feeling SHOB.  Reports having asthma and wanted to come in before it got bad.  Reports if gets bad she cannot breath.  They normally just give me oxygen and it gets ok and i go home. Dry cough. Denies fevers. Recently seen dx with PNA in ED but pts PCP at [facility] told her she did not have PNA.  No respiratory distress noted.  Slight wheeze mostly left side. Spoke with dr Jimmye Norman about pt, ok for flex.

## 2015-02-20 ENCOUNTER — Observation Stay
Admission: EM | Admit: 2015-02-20 | Discharge: 2015-02-21 | Disposition: A | Discharge status: Home / Self Care | Payer: Medicare Other | Attending: Internal Medicine | Admitting: Internal Medicine

## 2015-02-20 ENCOUNTER — Other Ambulatory Visit: Payer: Self-pay | Admitting: *Deleted

## 2015-02-20 ENCOUNTER — Other Ambulatory Visit: Payer: Self-pay

## 2015-02-20 ENCOUNTER — Encounter: Payer: Self-pay | Admitting: *Deleted

## 2015-02-20 ENCOUNTER — Emergency Department: Payer: Medicare Other

## 2015-02-20 DIAGNOSIS — N63 Unspecified lump in breast: Secondary | ICD-10-CM | POA: Insufficient documentation

## 2015-02-20 DIAGNOSIS — F329 Major depressive disorder, single episode, unspecified: Secondary | ICD-10-CM | POA: Insufficient documentation

## 2015-02-20 DIAGNOSIS — Z800 Family history of malignant neoplasm of digestive organs: Secondary | ICD-10-CM | POA: Insufficient documentation

## 2015-02-20 DIAGNOSIS — R0602 Shortness of breath: Secondary | ICD-10-CM | POA: Insufficient documentation

## 2015-02-20 DIAGNOSIS — Z9049 Acquired absence of other specified parts of digestive tract: Secondary | ICD-10-CM | POA: Diagnosis not present

## 2015-02-20 DIAGNOSIS — I371 Nonrheumatic pulmonary valve insufficiency: Secondary | ICD-10-CM | POA: Insufficient documentation

## 2015-02-20 DIAGNOSIS — I491 Atrial premature depolarization: Secondary | ICD-10-CM | POA: Insufficient documentation

## 2015-02-20 DIAGNOSIS — R0902 Hypoxemia: Secondary | ICD-10-CM | POA: Diagnosis not present

## 2015-02-20 DIAGNOSIS — I340 Nonrheumatic mitral (valve) insufficiency: Secondary | ICD-10-CM | POA: Insufficient documentation

## 2015-02-20 DIAGNOSIS — J811 Chronic pulmonary edema: Secondary | ICD-10-CM | POA: Diagnosis not present

## 2015-02-20 DIAGNOSIS — R0600 Dyspnea, unspecified: Secondary | ICD-10-CM | POA: Insufficient documentation

## 2015-02-20 DIAGNOSIS — J810 Acute pulmonary edema: Secondary | ICD-10-CM | POA: Insufficient documentation

## 2015-02-20 DIAGNOSIS — J441 Chronic obstructive pulmonary disease with (acute) exacerbation: Secondary | ICD-10-CM

## 2015-02-20 DIAGNOSIS — I700 Atherosclerosis of aorta: Secondary | ICD-10-CM | POA: Insufficient documentation

## 2015-02-20 DIAGNOSIS — R05 Cough: Secondary | ICD-10-CM | POA: Diagnosis not present

## 2015-02-20 DIAGNOSIS — J4531 Mild persistent asthma with (acute) exacerbation: Principal | ICD-10-CM | POA: Insufficient documentation

## 2015-02-20 DIAGNOSIS — F419 Anxiety disorder, unspecified: Secondary | ICD-10-CM | POA: Insufficient documentation

## 2015-02-20 DIAGNOSIS — Z833 Family history of diabetes mellitus: Secondary | ICD-10-CM | POA: Insufficient documentation

## 2015-02-20 DIAGNOSIS — Z90710 Acquired absence of both cervix and uterus: Secondary | ICD-10-CM | POA: Insufficient documentation

## 2015-02-20 DIAGNOSIS — R600 Localized edema: Secondary | ICD-10-CM | POA: Insufficient documentation

## 2015-02-20 DIAGNOSIS — Z888 Allergy status to other drugs, medicaments and biological substances status: Secondary | ICD-10-CM | POA: Diagnosis not present

## 2015-02-20 DIAGNOSIS — Z79899 Other long term (current) drug therapy: Secondary | ICD-10-CM | POA: Diagnosis not present

## 2015-02-20 DIAGNOSIS — Z8673 Personal history of transient ischemic attack (TIA), and cerebral infarction without residual deficits: Secondary | ICD-10-CM | POA: Diagnosis not present

## 2015-02-20 DIAGNOSIS — N281 Cyst of kidney, acquired: Secondary | ICD-10-CM | POA: Insufficient documentation

## 2015-02-20 DIAGNOSIS — I4510 Unspecified right bundle-branch block: Secondary | ICD-10-CM | POA: Insufficient documentation

## 2015-02-20 DIAGNOSIS — D485 Neoplasm of uncertain behavior of skin: Secondary | ICD-10-CM | POA: Insufficient documentation

## 2015-02-20 DIAGNOSIS — I351 Nonrheumatic aortic (valve) insufficiency: Secondary | ICD-10-CM | POA: Insufficient documentation

## 2015-02-20 DIAGNOSIS — Z7951 Long term (current) use of inhaled steroids: Secondary | ICD-10-CM | POA: Insufficient documentation

## 2015-02-20 LAB — COMPREHENSIVE METABOLIC PANEL
ALK PHOS: 64 U/L (ref 38–126)
ALT: 16 U/L (ref 14–54)
AST: 24 U/L (ref 15–41)
Albumin: 4.2 g/dL (ref 3.5–5.0)
Anion gap: 6 (ref 5–15)
BILIRUBIN TOTAL: 0.9 mg/dL (ref 0.3–1.2)
BUN: 17 mg/dL (ref 6–20)
CALCIUM: 9.3 mg/dL (ref 8.9–10.3)
CO2: 32 mmol/L (ref 22–32)
CREATININE: 0.9 mg/dL (ref 0.44–1.00)
Chloride: 104 mmol/L (ref 101–111)
GFR, EST NON AFRICAN AMERICAN: 58 mL/min — AB (ref >60)
Glucose, Bld: 113 mg/dL — ABNORMAL HIGH (ref 65–99)
Potassium: 3.7 mmol/L (ref 3.5–5.1)
Sodium: 142 mmol/L (ref 135–145)
Total Protein: 7.2 g/dL (ref 6.5–8.1)

## 2015-02-20 LAB — CBC
HEMATOCRIT: 44.2 % (ref 35.0–47.0)
HEMOGLOBIN: 14.7 g/dL (ref 12.0–16.0)
MCH: 30.4 pg (ref 26.0–34.0)
MCHC: 33.4 g/dL (ref 32.0–36.0)
MCV: 91.2 fL (ref 80.0–100.0)
Platelets: 191 10*3/uL (ref 150–440)
RBC: 4.84 MIL/uL (ref 3.80–5.20)
RDW: 13.6 % (ref 11.5–14.5)
WBC: 6.1 10*3/uL (ref 3.6–11.0)

## 2015-02-20 LAB — TROPONIN I: Troponin I: <0.03 ng/mL (ref <0.031)

## 2015-02-20 LAB — TSH: TSH: 1.205 u[IU]/mL (ref 0.350–4.500)

## 2015-02-20 LAB — BRAIN NATRIURETIC PEPTIDE: B NATRIURETIC PEPTIDE 5: 99 pg/mL (ref 0.0–100.0)

## 2015-02-20 MED ORDER — ALPRAZOLAM 0.25 MG PO TABS: 0.2500 mg | ORAL_TABLET | Freq: Four times a day (QID) | ORAL | Status: DC | PRN

## 2015-02-20 MED ORDER — IPRATROPIUM-ALBUTEROL 0.5-2.5 (3) MG/3ML IN SOLN: 3.0000 mL | RESPIRATORY_TRACT | Status: DC | PRN

## 2015-02-20 MED ORDER — FUROSEMIDE 10 MG/ML IJ SOLN
20.0000 mg | Freq: Once | INTRAMUSCULAR | Status: AC
  Administered 2015-02-20: 20 mg via INTRAVENOUS
  Filled 2015-02-20: qty 4

## 2015-02-20 MED ORDER — ONDANSETRON HCL 4 MG PO TABS: 4.0000 mg | ORAL_TABLET | Freq: Four times a day (QID) | ORAL | Status: DC | PRN

## 2015-02-20 MED ORDER — FUROSEMIDE 10 MG/ML IJ SOLN: 40.0000 mg | Freq: Every day | INTRAMUSCULAR | Status: DC

## 2015-02-20 MED ORDER — LISINOPRIL 5 MG PO TABS
2.5000 mg | ORAL_TABLET | Freq: Every day | ORAL | Status: DC
  Filled 2015-02-20: qty 1

## 2015-02-20 MED ORDER — ACETAMINOPHEN 650 MG RE SUPP
650.0000 mg | Freq: Four times a day (QID) | RECTAL | Status: DC | PRN
Start: 2015-02-20 — End: 2015-02-21

## 2015-02-20 MED ORDER — ASPIRIN EC 81 MG PO TBEC
81.0000 mg | DELAYED_RELEASE_TABLET | Freq: Every day | ORAL | Status: DC
  Filled 2015-02-20: qty 1

## 2015-02-20 MED ORDER — ONDANSETRON HCL 4 MG/2ML IJ SOLN: 4.0000 mg | Freq: Four times a day (QID) | INTRAMUSCULAR | Status: DC | PRN

## 2015-02-20 MED ORDER — BUDESONIDE 0.5 MG/2ML IN SUSP
0.5000 mg | Freq: Two times a day (BID) | RESPIRATORY_TRACT | Status: DC
  Administered 2015-02-21: 0.5 mg via RESPIRATORY_TRACT
  Filled 2015-02-20: qty 2

## 2015-02-20 MED ORDER — LEVALBUTEROL HCL 1.25 MG/3ML IN NEBU
1.2500 mg | INHALATION_SOLUTION | Freq: Once | RESPIRATORY_TRACT | Status: AC
  Administered 2015-02-20: 1.25 mg via RESPIRATORY_TRACT
  Filled 2015-02-20: qty 3

## 2015-02-20 MED ORDER — HEPARIN SODIUM (PORCINE) 5000 UNIT/ML IJ SOLN
5000.0000 [IU] | Freq: Three times a day (TID) | INTRAMUSCULAR | Status: DC
  Filled 2015-02-20: qty 1

## 2015-02-20 MED ORDER — SODIUM CHLORIDE 0.9 % IJ SOLN
3.0000 mL | Freq: Two times a day (BID) | INTRAMUSCULAR | Status: DC
Start: 2015-02-20 — End: 2015-02-21
  Administered 2015-02-20 – 2015-02-21 (×2): 3 mL via INTRAVENOUS

## 2015-02-20 MED ORDER — MORPHINE SULFATE (PF) 2 MG/ML IV SOLN: 2.0000 mg | INTRAVENOUS | Status: DC | PRN

## 2015-02-20 MED ORDER — ACETAMINOPHEN 325 MG PO TABS: 650.0000 mg | ORAL_TABLET | Freq: Four times a day (QID) | ORAL | Status: DC | PRN

## 2015-02-20 MED ORDER — OXYCODONE HCL 5 MG PO TABS: 5.0000 mg | ORAL_TABLET | ORAL | Status: DC | PRN

## 2015-02-20 NOTE — Patient Outreach (Signed)
RNCM received an in-basket message at 4:58pm to contact pt related to a call she had placed to [place] line. RNCM went to return pt's call and noted she was in the ED.   Plan: RNCM will request telephonic care-manager to outreach pt tom related to this RNCM will be on PAL.   Rutherford Limerick RN, BSN  [facility] Care Management [phone])

## 2015-02-20 NOTE — Patient Outreach (Addendum)
[facility]) Care Management  02/20/2015  Kaleese [facility] 12-06-1929 [id number]   Telephone Screen  Referral Date: 02/20/15 Referral Source: MD office(Dr. Army Melia) Referral Reason:"repeated visits to ER for SOB and no acute problems found, concern for issues at home with medication compliance and understanding"  Outreach attempt #1 to patient. Patient reached and screening completed.  Social: Patient resides in her home. She reports being widowed for a little over a year now. She states that her granddtr stays with her at times and helps her out. Patient reports that she helps take care of granddtr's son who is 51 yrs old. She denies any recent falls or issues with pain. She is independent with ADLs/IADLs. She drives herself to MD appts.   Conditions: Patient has h/o asthma/COPD, anxiety and "lump in breast." She states she has not followed up with MD regarding rather or not mass is cancerous as she "hasn't had the time."  Medications: Patient questioned several times regarding her medications. Her response each time was " I don't take any  prescription medicines, only vitamins." When patient asked regarding respiratory meds she states she takes them "once in a while." Patient also reported that something is wrong with her neb machine but she is still using it. She states that the machine belong to her deceased spouse and she has been using since he passed away.  ER Visits: Patient has had three ER visits this month( 02/09/15, 02/14/15 and 02/19/15). She states they were all related to her experiencing some form of trouble breathing and "needing oxygen." She shares that during ER visit yesterday she left before being seen by MD because staff "wouldn't give me no oxygen."  Patient reported that she has "bed bug issues" and keeps spraying to get rid of them. However, she has noticed whenever she sprays this causes breathing problems. Patient also reported that she thinks her  anxiety triggers these events. She reports some difficulty in having to help raise great grandson especially in the mornings as she has to get him up and ready for school. Patient became tearful during conversation as she talks about her great grandson and some of the challenges with him. RN CM provided emotional support to patient.  Appointments: Patient states she goes back to see PCP on 03/11/14 and she will get PNA vaccine during appt. She got flu vaccine on 12/11/14.  Consent: Patient gave verbal consent for [facility] services.  Plan: RN CM provided education to patient on ways to manage SOB and avoiding SOB triggers. RN CM provided education in regards to medication adherence and compliance. RN CM instructed patient to contact provider of neb machine to troubleshoot issues with them. RN CM will notify [facility] administrative assistant that patient agreed to services and case opened. RN CM will send [facility] community CM referral for further in home evaluation/assessment of care needs. RN CM provided [name] 24 hr Nurse Line info to patient.  Enzo Montgomery, RN,BSN,CCM [facility] Telephonic Care Management Coordinator Direct Phone: [phone] Toll Free: [phone] Fax: [phone]
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
## 2015-02-20 NOTE — H&P (Signed)
[place] [facility] [place] NAME: Allison Snyder    MR#:  [id number]  DATE OF BIRTH:  [birth year]   DATE OF ADMISSION:  02/20/2015  PRIMARY CARE PHYSICIAN: Halina Maidens, MD   REQUESTING/REFERRING PHYSICIAN: Corky Downs  CHIEF COMPLAINT:   Chief Complaint  Patient presents with  . Shortness of Breath    HISTORY OF PRESENT ILLNESS:  Allison Snyder  is a 81 y.o. female with a known history of moderate persistent asthma who is presenting with shortness of breath. She describes approximately one day shortness of breath denies any cough, fever, chills positive lower extremity edema as well as positive orthopnea denies further symptomatology. Emergency department chest x-ray revealing for pulmonary edema received Lasix symptoms have improved however while in the emergency department she becamemildly hypoxemic with exertion .  PAST MEDICAL HISTORY:   Past Medical History  Diagnosis Date  . Asthma   . History of appendectomy   . H/O tubal ligation     PAST SURGICAL HISTORY:   Past Surgical History  Procedure Laterality Date  . Abdominal hysterectomy    . Tubal ligation      SOCIAL HISTORY:   Social History  Substance Use Topics  . Smoking status: Never Smoker   . Smokeless tobacco: Not on file  . Alcohol Use: No    FAMILY HISTORY:   Family History  Problem Relation Age of Onset  . Colon cancer Mother   . Diabetes Sister     DRUG ALLERGIES:   Allergies  Allergen Reactions  . Levaquin [Levofloxacin] Shortness Of Breath  . Prednisone Shortness Of Breath  . Albuterol Anxiety    REVIEW OF SYSTEMS:  REVIEW OF SYSTEMS:  CONSTITUTIONAL: Denies fevers, chills, fatigue, weakness.  EYES: Denies blurred vision, double vision, or eye pain.  EARS, NOSE, THROAT: Denies tinnitus, ear pain, hearing loss.  RESPIRATORY: denies cough,  positive shortness of breath,  denies wheezing  CARDIOVASCULAR: Denies chest pain, palpitations,   positive edema.  GASTROINTESTINAL: Denies nausea, vomiting, diarrhea, abdominal pain.  GENITOURINARY: Denies dysuria, hematuria.  ENDOCRINE: Denies nocturia or thyroid problems. HEMATOLOGIC AND LYMPHATIC: Denies easy bruising or bleeding.  SKIN: Denies rash or lesions.  MUSCULOSKELETAL: Denies pain in neck, back, shoulder, knees, hips, or further arthritic symptoms.  NEUROLOGIC: Denies paralysis, paresthesias.  PSYCHIATRIC: Denies anxiety or depressive symptoms. Otherwise full review of systems performed by me is negative.   MEDICATIONS AT HOME:   Prior to Admission medications   Medication Sig Start Date End Date Taking? Authorizing Provider  azithromycin (ZITHROMAX Z-PAK) 250 MG tablet 1 tab daily starting 02/10/15 02/09/15   Delman Kitten, MD  budesonide (PULMICORT) 0.5 MG/2ML nebulizer solution Take 2 mLs (0.5 mg total) by nebulization 2 (two) times daily. 12/23/14   Glean Hess, MD  Respiratory Therapy Supplies (FULL KIT NEBULIZER SET) MISC 1 each by Does not apply route 2 (two) times daily. 12/23/14   Glean Hess, MD      VITAL SIGNS:  Blood pressure 114/58, pulse 77, temperature 97.7 F (36.5 C), temperature source Oral, resp. rate 17, height _0  (1.651 m), weight 157 lb (71.215 kg), SpO2 94 %.  PHYSICAL EXAMINATION:  VITAL SIGNS: Filed Vitals:   02/20/15 1915 02/20/15 2000  BP: 153/74 114/58  Pulse: 76 77  Temp: 97.7 F (36.5 C)   Resp: 63 17   GENERAL:81 y.o.female currently in no acute distress.  HEAD: Normocephalic, atraumatic.  EYES: Pupils equal, round, reactive to light. Extraocular muscles  intact. No scleral icterus.  MOUTH: Moist mucosal membrane. Dentition intact. No abscess noted.  EAR, NOSE, THROAT: Clear without exudates. No external lesions.  NECK: Supple. No thyromegaly. No nodules. No JVD.  PULMONARY:  minimal bibasilar crackle otherwise Clear to ascultation, without wheeze rails . No use of accessory muscles, Good respiratory effort. good air  entry bilaterally CHEST: Nontender to palpation.  CARDIOVASCULAR: S1 and S2. Regular rate and rhythm. No murmurs, rubs, or gallopstraceema. Pedal pulses 2+ bilaterally.  GASTROINTESTINAL: Soft, nontender, nondistended. No masses. Positive bowel sounds. No hepatosplenomegaly.  MUSCULOSKELETAL: No swelling, clubbing, or edema. Range of motion full in all extremities.  NEUROLOGIC: Cranial nerves II through XII are intact. No gross focal neurological deficits. Sensation intact. Reflexes intact.  SKIN: No ulceration, lesions, rashes, or cyanosis. Skin warm and dry. Turgor intact.  PSYCHIATRIC: Mood, affect within normal limits. The patient is awake, alert and oriented x 3. Insight, judgment intact.    LABORATORY PANEL:   CBC  Recent Labs Lab 02/20/15 1721  WBC 6.1  HGB 14.7  HCT 44.2  PLT 191   ------------------------------------------------------------------------------------------------------------------  Chemistries   Recent Labs Lab 02/20/15 1721  NA 142  K 3.7  CL 104  CO2 32  GLUCOSE 113*  BUN 17  CREATININE 0.90  CALCIUM 9.3  AST 24  ALT 16  ALKPHOS 64  BILITOT 0.9   ------------------------------------------------------------------------------------------------------------------  Cardiac Enzymes  Recent Labs Lab 02/20/15 1721  TROPONINI <0.03   ------------------------------------------------------------------------------------------------------------------  RADIOLOGY:  Dg Chest Portable 1 View  02/20/2015  CLINICAL DATA:  Shortness of breath EXAM: PORTABLE CHEST 1 VIEW COMPARISON:  02/09/2015 chest radiograph. FINDINGS: Stable cardiomediastinal silhouette with mild cardiomegaly. No pneumothorax. Suggestion of trace bilateral pleural effusions. Suggestion of mild pulmonary edema. No focal lung consolidation. IMPRESSION: 1. Stable mild cardiomegaly with likely mild pulmonary edema, suggesting mild congestive heart failure. 2. Suggestion of trace bilateral  pleural effusions. Electronically Signed   By: Ilona Sorrel M.D.   On: 02/20/2015 17:59    EKG:   Orders placed or performed during the hospital encounter of 02/20/15  . EKG 12-Lead  . EKG 12-Lead    IMPRESSION AND PLAN:    81 year old Caucasian female history significant for mild persistent asthma presenting with shortness of breath  1. Pulmonary edema: States that she has a diet high in sodium however had a recent normal echocardiogram aside from mild mitral regurgitation performed about one month ago. Her symptoms have improved with Lasix will continue to observe, place on telemetry and continue his Lasix follow I's and O's provide supplemental oxygen as well as breathing treatments as required  2. Depression: She denies any suicidal ideation however does have alteration in sleep, interest, energy, appetite I have offered psychiatric services which she has declined she is also declined the option of medication at this time 3. Venous thrombi embolism prophylactic: Heparin subcutaneous  he records are reviewed and case discussed with ED provider. Management plans discussed with the patient, family and they are in agreement.  CODE STATUS: fullL TIME TAKING CARE OF THIS PATIE35  minutes.    Hower,  Karenann Cai.D on 02/20/2015 at 9:00 PM  Between 7am to 6pm - Pager - [phone]  After 6pm: House Pager: - [facility]  Office  [phone]  CC: Primary care physician; Halina Maidens, MD

## 2015-02-20 NOTE — ED Provider Notes (Signed)
[facility] Emergency Department Provider Note  ____________________________________________  Time seen: On arrival  I have reviewed the triage vital signs and the nursing notes.   HISTORY  Chief Complaint Shortness of Breath    HPI Allison Snyder is a 81 y.o. female who presents with complaints of mild shortness of breath. She reports she has a history of asthma and sometimes anxiety can worsen her asthma and she lives to come in early before it gets bad. She denies fevers chills. She does report a dry cough. She has no chest pain or chest tightness. No throat pain or swelling. No diaphoresis     Past Medical History  Diagnosis Date  . Asthma   . History of appendectomy   . H/O tubal ligation     Patient Active Problem List   Diagnosis Date Noted  . Breast mass, left 12/11/2014  . Bilateral renal cysts 09/23/2014  . Asthma, mild persistent 07/27/2014  . Aortic atherosclerosis ([place]) 07/27/2014  . Neoplasm of uncertain behavior of skin 07/27/2014  . Impaired renal function 07/27/2014  . Urinary system disease 07/27/2014    Past Surgical History  Procedure Laterality Date  . Abdominal hysterectomy    . Tubal ligation      Current Outpatient Rx  Name  Route  Sig  Dispense  Refill  . azithromycin (ZITHROMAX Z-PAK) 250 MG tablet      1 tab daily starting 02/10/15   4 each   0   . budesonide (PULMICORT) 0.5 MG/2ML nebulizer solution   Nebulization   Take 2 mLs (0.5 mg total) by nebulization 2 (two) times daily.   240 mL   5   . Respiratory Therapy Supplies (FULL KIT NEBULIZER SET) MISC   Does not apply   1 each by Does not apply route 2 (two) times daily.   1 each   0     Allergies Levaquin; Prednisone; and Albuterol  Family History  Problem Relation Age of Onset  . Colon cancer Mother   . Diabetes Sister     Social History Social History  Substance Use Topics  . Smoking status: Never Smoker   . Smokeless tobacco:  None  . Alcohol Use: No    Review of Systems  Constitutional: Negative for fever. Eyes: Negative for visual changes. ENT: Negative for sore throat Cardiovascular: Negative for chest pain. Respiratory: Positive for shortness of breath Gastrointestinal: Negative for abdominal pain, vomiting and diarrhea. Genitourinary: Negative for dysuria. Musculoskeletal: Negative for back pain. Skin: Negative for rash. Neurological: Negative for headaches or focal weakness Psychiatric: Mild anxiety    ____________________________________________   PHYSICAL EXAM:  VITAL SIGNS: ED Triage Vitals  Enc Vitals Group     BP 02/20/15 1712 136/57 mmHg     Pulse Rate 02/20/15 1712 77     Resp 02/20/15 1712 18     Temp 02/20/15 1712 98 F (36.7 C)     Temp Source 02/20/15 1712 Oral     SpO2 02/20/15 1712 93 %     Weight 02/20/15 1712 157 lb (71.215 kg)     Height 02/20/15 1712 _0  (1.651 m)     Head Cir --      Peak Flow --      Pain Score 02/20/15 1717 0     Pain Loc --      Pain Edu? --      Excl. in [place]? --      Constitutional: Alert and oriented. Well appearing and  in no distress. Eyes: Conjunctivae are normal.  ENT   Head: Normocephalic and atraumatic.   Mouth/Throat: Mucous membranes are moist. Cardiovascular: Normal rate, regular rhythm. Normal and symmetric distal pulses are present in all extremities. No murmurs, rubs, or gallops. Respiratory: Normal respiratory effort without tachypnea nor retractions. Expiratory wheeze primarily on the right Gastrointestinal: Soft and non-tender in all quadrants. No distention. There is no CVA tenderness. Genitourinary: deferred Musculoskeletal: Nontender with normal range of motion in all extremities. No lower extremity tenderness nor edema. Neurologic:  Normal speech and language. No gross focal neurologic deficits are appreciated. Skin:  Skin is warm, dry and intact. No rash noted. Psychiatric: Mood and affect are normal. Patient  exhibits appropriate insight and judgment.  ____________________________________________    LABS (pertinent positives/negatives)  Labs Reviewed - No data to display  ____________________________________________   EKG  ED ECG REPORT I, Lavonia Drafts, the attending physician, personally viewed and interpreted this ECG.  Date: 02/20/2015 EKG Time: 5:17 PM Rate: 77 Rhythm: normal sinus rhythm QRS Axis: normal Intervals: normal ST/T Wave abnormalities: normal Conduction Disutrbances: Right bundle branch block Narrative Interpretation: unremarkable   ____________________________________________    RADIOLOGY I have personally reviewed any xrays that were ordered on this patient: Mild pulmonary edema  ____________________________________________   PROCEDURES  Procedure(s) performed: none  Critical Care performed:none  ____________________________________________   INITIAL IMPRESSION / ASSESSMENT AND PLAN / ED COURSE  Pertinent labs & imaging results that were available during my care of the patient were reviewed by me and considered in my medical decision making (see chart for details).  Patient overall well-appearing. Her pulse ox is ready 92% with some scattered wheezes primarily on the right. We will try Xopenex given her report that albuterol causes further anxiety for her. We will obtain chest x-ray to evaluate for possible pneumonia.  Chest x-ray appears consistent with mild pulmonary edema which would explain her shortness of breath. She does have a history of untreated hypertension. She has no history of CHF. She continues to require oxygen and becomes markedly short of breath with any ambulation. I will admit for further evaluation. Lasix 20 mg IV given  ____________________________________________   FINAL CLINICAL IMPRESSION(S) / ED DIAGNOSES  Final diagnoses:  Acute pulmonary edema (HCC)  Shortness of breath     Lavonia Drafts, MD 02/20/15 1954

## 2015-02-21 ENCOUNTER — Observation Stay (HOSPITAL_BASED_OUTPATIENT_CLINIC_OR_DEPARTMENT_OTHER)
Admit: 2015-02-21 | Discharge: 2015-02-21 | Disposition: A | Discharge status: Home / Self Care | Payer: Medicare Other | Attending: Internal Medicine | Admitting: Internal Medicine

## 2015-02-21 DIAGNOSIS — R0600 Dyspnea, unspecified: Secondary | ICD-10-CM | POA: Diagnosis not present

## 2015-02-21 DIAGNOSIS — J45901 Unspecified asthma with (acute) exacerbation: Secondary | ICD-10-CM | POA: Diagnosis not present

## 2015-02-21 DIAGNOSIS — F329 Major depressive disorder, single episode, unspecified: Secondary | ICD-10-CM | POA: Diagnosis not present

## 2015-02-21 DIAGNOSIS — J4531 Mild persistent asthma with (acute) exacerbation: Secondary | ICD-10-CM | POA: Diagnosis not present

## 2015-02-21 DIAGNOSIS — J069 Acute upper respiratory infection, unspecified: Secondary | ICD-10-CM | POA: Diagnosis not present

## 2015-02-21 LAB — BASIC METABOLIC PANEL
ANION GAP: 8 (ref 5–15)
BUN: 14 mg/dL (ref 6–20)
CALCIUM: 9.1 mg/dL (ref 8.9–10.3)
CO2: 34 mmol/L — ABNORMAL HIGH (ref 22–32)
Chloride: 102 mmol/L (ref 101–111)
Creatinine, Ser: 0.72 mg/dL (ref 0.44–1.00)
GLUCOSE: 100 mg/dL — AB (ref 65–99)
POTASSIUM: 3.2 mmol/L — AB (ref 3.5–5.1)
Sodium: 144 mmol/L (ref 135–145)

## 2015-02-21 LAB — TROPONIN I: Troponin I: <0.03 ng/mL (ref <0.031)

## 2015-02-21 MED ORDER — POTASSIUM CHLORIDE CRYS ER 20 MEQ PO TBCR
40.0000 meq | EXTENDED_RELEASE_TABLET | Freq: Once | ORAL | Status: AC
  Administered 2015-02-21: 40 meq via ORAL
  Filled 2015-02-21: qty 2

## 2015-02-21 MED ORDER — AZITHROMYCIN 250 MG PO TABS
500.0000 mg | ORAL_TABLET | Freq: Every day | ORAL | Status: AC
  Administered 2015-02-21: 500 mg via ORAL
  Filled 2015-02-21: qty 2

## 2015-02-21 MED ORDER — AZITHROMYCIN 250 MG PO TABS: 250.0000 mg | ORAL_TABLET | Freq: Every day | ORAL | Status: DC

## 2015-02-21 MED ORDER — AZITHROMYCIN 250 MG PO TABS: ORAL_TABLET | ORAL | Status: DC

## 2015-02-21 NOTE — Progress Notes (Signed)
*  PRELIMINARY RESULTS* Echocardiogram 2D Echocardiogram has been performed.  Allison Snyder 02/21/2015, 11:39 AM

## 2015-02-21 NOTE — Progress Notes (Signed)
Patient received discharge instructions, pt verbalized understanding. IV was removed with no signs of infection. Dressing clean, dry intact. No skin tears or wounds present. Prescription sent to pharmacy of choice. Patient was escorted out with staff member via wheelchair via private auto. No further needs from care management team.

## 2015-02-21 NOTE — Progress Notes (Signed)
SATURATION QUALIFICATIONS: (This note is used to comply with regulatory documentation for home oxygen)  Patient Saturations on Room Air at Rest = 96%  Patient Saturations on Room Air while Ambulating = 90%  Patient Saturations on  Liters of oxygen while Ambulating = %  Please briefly explain why patient needs home oxygen: 

## 2015-02-21 NOTE — Care Management (Signed)
Patient to be discharged today to home.  Patient did not have qualifying oxygen saturations to go home with oxygen. No further RNCM needs

## 2015-02-21 NOTE — Care Management Obs Status (Signed)
[facility] NOTIFICATION   Patient Details  Name: Allison Snyder MRN: [id number] Date of Birth: [birth year]   Medicare Observation Status Notification Given:  Yes, reviewed with patient and granddaughter Signed and sent to HIM.      Beverly Sessions, RN 02/21/2015, 9:29 AM

## 2015-02-21 NOTE — Discharge Instructions (Signed)
Use your nebulizer as before  [facility] Failure Clinic appointment on March 19, 2015 at 11:00am with Darylene Price, [place]. Please call [phone] to reschedule.

## 2015-02-21 NOTE — Discharge Summary (Signed)
[facility] NAME: Allison Snyder    MR#:  [id number]  DATE OF BIRTH:  May 20, 1929  DATE OF ADMISSION:  02/20/2015 ADMITTING PHYSICIAN: Lytle Butte, MD  DATE OF DISCHARGE: 02/21/15  PRIMARY CARE PHYSICIAN: Halina Maidens, MD    ADMISSION DIAGNOSIS:  Shortness of breath [R06.02] Acute pulmonary edema ([place]) [J81.0]  DISCHARGE DIAGNOSIS:  Shortness of breath due to Asthma flare Mild pulmonary edema-resolved Cough/URI/Congestion  SECONDARY DIAGNOSIS:   Past Medical History  Diagnosis Date  . Asthma   . History of appendectomy   . H/O tubal ligation     HOSPITAL COURSE:  81 year old Caucasian female history significant for mild persistent asthma presenting with shortness of breath  1. shortness of breath suspected due to mild pulmonary edema improved superimposed with asthma exacerbation:  Her symptoms have improved with Lasix -Patient does not appear to be volume overloaded. His joint indication for further doses of Lasix. Her shortness of breath is more likely due to her cough and COPD/asthma exacerbation -We'll give her a course of Zithromax. -She cannot tolerate prednisone. Patient reports having TIA-like symptoms in the remote past with it. -Continue budesonide nebulization at home. I offered patient rescue inhaler with albuterol or Proventil she declined. -She was explained she does not meet eligibility for home oxygen. Her sats were 90% on ambulation and 96% on room air. -Cardiac enzymes 3 negative  2. Depression: She denies any suicidal ideation however does have alteration in sleep, interest, energy, appetite I have offered psychiatric services which she has declined she is also declined the option of medication at this time  3. Venous thrombi embolism prophylactic: Heparin subcutaneous  Overall appears hemodynamically stable. We'll discharge her to go home.  CONSULTS OBTAINED:  Treatment Team:  Lytle Butte, MD  DRUG ALLERGIES:   Allergies  Allergen Reactions  . Levaquin [Levofloxacin] Shortness Of Breath  . Prednisone Shortness Of Breath  . Albuterol Anxiety    DISCHARGE MEDICATIONS:   Current Discharge Medication List    START taking these medications   Details  azithromycin (ZITHROMAX) 250 MG tablet Take 1 tab daily for 6 days Qty: 6 each, Refills: 0      CONTINUE these medications which have NOT CHANGED   Details  budesonide (PULMICORT) 0.5 MG/2ML nebulizer solution Take 2 mLs (0.5 mg total) by nebulization 2 (two) times daily. Qty: 240 mL, Refills: 5   Associated Diagnoses: Asthma, mild persistent, uncomplicated    Respiratory Therapy Supplies (FULL KIT NEBULIZER SET) MISC 1 each by Does not apply route 2 (two) times daily. Qty: 1 each, Refills: 0   Associated Diagnoses: Asthma, mild persistent, uncomplicated        If you experience worsening of your admission symptoms, develop shortness of breath, life threatening emergency, suicidal or homicidal thoughts you must seek medical attention immediately by calling 911 or calling your MD immediately  if symptoms less severe.  You Must read complete instructions/literature along with all the possible adverse reactions/side effects for all the Medicines you take and that have been prescribed to you. Take any new Medicines after you have completely understood and accept all the possible adverse reactions/side effects.   Please note  You were cared for by a hospitalist during your hospital stay. If you have any questions about your discharge medications or the care you received while you were in the hospital after you are discharged, you can call the unit and asked to speak with the  hospitalist on call if the hospitalist that took care of you is not available. Once you are discharged, your primary care physician will handle any further medical issues. Please note that NO REFILLS for any discharge medications will be authorized  once you are discharged, as it is imperative that you return to your primary care physician (or establish a relationship with a primary care physician if you do not have one) for your aftercare needs so that they can reassess your need for medications and monitor your lab values. Today   SUBJECTIVE   Feeling somewhat short of breath along with dry nonproductive cough.  VITAL SIGNS:  Blood pressure 126/65, pulse 63, temperature 98 F (36.7 C), temperature source Oral, resp. rate 21, height _0  (1.651 m), weight 153 lb 9.6 oz (69.673 kg), SpO2 93 %.  I/O:   Intake/Output Summary (Last 24 hours) at 02/21/15 1336 Last data filed at 02/21/15 1248  Gross per 24 hour  Intake    120 ml  Output      0 ml  Net    120 ml    PHYSICAL EXAMINATION:  GENERAL:  81 y.o.-year-old patient lying in the bed with no acute distress.  EYES: Pupils equal, round, reactive to light and accommodation. No scleral icterus. Extraocular muscles intact.  HEENT: Head atraumatic, normocephalic. Oropharynx and nasopharynx clear.  NECK:  Supple, no jugular venous distention. No thyroid enlargement, no tenderness.  LUNGS: Distant breath sounds bilaterally, no wheezing, rales, scattered rhonchi or crepitation. No use of accessory muscles of respiration.  CARDIOVASCULAR: S1, S2 normal. No murmurs, rubs, or gallops.  ABDOMEN: Soft, non-tender, non-distended. Bowel sounds present. No organomegaly or mass.  EXTREMITIES: No pedal edema, cyanosis, or clubbing.  NEUROLOGIC: Cranial nerves II through XII are intact. Muscle strength 5/5 in all extremities. Sensation intact. Gait not checked.  PSYCHIATRIC: The patient is alert and oriented x 3.  SKIN: No obvious rash, lesion, or ulcer.   DATA REVIEW:   CBC   Recent Labs Lab 02/20/15 1721  WBC 6.1  HGB 14.7  HCT 44.2  PLT 191    Chemistries   Recent Labs Lab 02/20/15 1721 02/21/15 0423  NA 142 144  K 3.7 3.2*  CL 104 102  CO2 32 34*  GLUCOSE 113* 100*  BUN  17 14  CREATININE 0.90 0.72  CALCIUM 9.3 9.1  AST 24  --   ALT 16  --   ALKPHOS 64  --   BILITOT 0.9  --     Microbiology Results   No results found for this or any previous visit (from the past 240 hour(s)).  RADIOLOGY:  Dg Chest Portable 1 View  02/20/2015  CLINICAL DATA:  Shortness of breath EXAM: PORTABLE CHEST 1 VIEW COMPARISON:  02/09/2015 chest radiograph. FINDINGS: Stable cardiomediastinal silhouette with mild cardiomegaly. No pneumothorax. Suggestion of trace bilateral pleural effusions. Suggestion of mild pulmonary edema. No focal lung consolidation. IMPRESSION: 1. Stable mild cardiomegaly with likely mild pulmonary edema, suggesting mild congestive heart failure. 2. Suggestion of trace bilateral pleural effusions. Electronically Signed   By: Ilona Sorrel M.D.   On: 02/20/2015 17:59     Management plans discussed with the patient, family and they are in agreement.  CODE STATUS:     Code Status Orders        Start     Ordered   02/20/15 2033  Full code   Continuous     02/20/15 2032    Advance Directive Documentation  Most Recent Value   Type of Advance Directive  Living will   Pre-existing out of facility DNR order (yellow form or pink MOST form)     "MOST" Form in Place?        TOTAL TIME TAKING CARE OF THIS PATIENT: 40 minutes.    Satori Krabill M.D on 02/21/2015 at 1:36 PM  Between 7am to 6pm - Pager - [phone] After 6pm go to [email or web address] - password [email or web address] [facility] [place] [facility]  Office  [phone]  CC: Primary care physician; Halina Maidens, MD

## 2015-02-21 NOTE — Progress Notes (Signed)
Initial appointment made at the [facility] on March 19, 2015 at 11:00am. Thank you.

## 2015-02-21 NOTE — Care Management (Addendum)
Patient presents from home with pulmonary edema. Patient lives at home with her Granddaughter and great grandchildren.  Patient  Will being using [facility] on [address].  Patient states that she has a walker and cane at home but does not have a need for them.  Patient drives her self, and granddaughter provides transportation when necessary. Patient is adamant that she needs oxygen to return to home.  Nursing will assess needs for home O2.  RNCM following for discharge disposition.

## 2015-02-23 ENCOUNTER — Emergency Department
Admission: EM | Admit: 2015-02-23 | Discharge: 2015-02-23 | Discharge status: Against Medical Advice | Payer: Medicare Other | Attending: Emergency Medicine | Admitting: Emergency Medicine

## 2015-02-23 ENCOUNTER — Other Ambulatory Visit: Payer: Self-pay

## 2015-02-23 ENCOUNTER — Encounter: Payer: Self-pay | Admitting: Emergency Medicine

## 2015-02-23 ENCOUNTER — Emergency Department: Payer: Medicare Other

## 2015-02-23 DIAGNOSIS — Z79899 Other long term (current) drug therapy: Secondary | ICD-10-CM | POA: Diagnosis not present

## 2015-02-23 DIAGNOSIS — R531 Weakness: Secondary | ICD-10-CM | POA: Diagnosis not present

## 2015-02-23 DIAGNOSIS — R42 Dizziness and giddiness: Secondary | ICD-10-CM | POA: Insufficient documentation

## 2015-02-23 DIAGNOSIS — Z7951 Long term (current) use of inhaled steroids: Secondary | ICD-10-CM | POA: Diagnosis not present

## 2015-02-23 DIAGNOSIS — R05 Cough: Secondary | ICD-10-CM | POA: Insufficient documentation

## 2015-02-23 LAB — CBC
HEMATOCRIT: 47.2 % — AB (ref 35.0–47.0)
HEMOGLOBIN: 15.8 g/dL (ref 12.0–16.0)
MCH: 30.4 pg (ref 26.0–34.0)
MCHC: 33.5 g/dL (ref 32.0–36.0)
MCV: 90.9 fL (ref 80.0–100.0)
Platelets: 202 10*3/uL (ref 150–440)
RBC: 5.2 MIL/uL (ref 3.80–5.20)
RDW: 13.3 % (ref 11.5–14.5)
WBC: 7.1 10*3/uL (ref 3.6–11.0)

## 2015-02-23 LAB — BASIC METABOLIC PANEL
ANION GAP: 9 (ref 5–15)
BUN: 19 mg/dL (ref 6–20)
CALCIUM: 9.2 mg/dL (ref 8.9–10.3)
CO2: 30 mmol/L (ref 22–32)
Chloride: 102 mmol/L (ref 101–111)
Creatinine, Ser: 0.93 mg/dL (ref 0.44–1.00)
GFR, EST NON AFRICAN AMERICAN: 55 mL/min — AB (ref >60)
GLUCOSE: 113 mg/dL — AB (ref 65–99)
POTASSIUM: 3.7 mmol/L (ref 3.5–5.1)
SODIUM: 141 mmol/L (ref 135–145)

## 2015-02-23 LAB — GLUCOSE, CAPILLARY: Glucose-Capillary: 113 mg/dL — ABNORMAL HIGH (ref 65–99)

## 2015-02-23 NOTE — ED Notes (Addendum)
Pt seen walking down hall out of room completely dressed.  This RN asked if she needed anything.  Patient looked back at RN, rolled eyes, then kept walking.  Primary RN Elenore Rota notified of pt leaving.  He reports no IV in.  Charge RN also notified pt has left.  Dr Cinda Quest notified.  NAD noted when pt leaving.

## 2015-02-23 NOTE — ED Provider Notes (Signed)
[facility] Emergency Department Provider Note  ____________________________________________  Time seen: Approximately 6:31 PM  I have reviewed the triage vital signs and the nursing notes.   HISTORY  Chief Complaint Dizziness and Weakness    HPI Allison Snyder is a 81 y.o. female who reports she got up quickly to get some thing for a 41-year-old she is watching and got lightheaded and dizzy. She thinks she might have gotten up too fast. She reports she's been coughing a lot and got some Zithromax for a cough and her other doctor told her that she had a wood in the lungs. Wanted to get a couple more tests but it might be a few minutes because it had a very sick Or she had seemed a kid she says 04 dear go ahead and take care of a kid. So I walked out to compare her EKG to the old one which looked about the same and as were sedating the child the patient this patient walks out. Patient did not seem to be confused or disoriented seem to understand everything quite well so much sure what happened. This is the think the third time she's walked out.  Past Medical History  Diagnosis Date  . Asthma   . History of appendectomy   . H/O tubal ligation     Patient Active Problem List   Diagnosis Date Noted  . Pulmonary edema 02/20/2015  . Breast mass, left 12/11/2014  . Bilateral renal cysts 09/23/2014  . Asthma, mild persistent 07/27/2014  . Aortic atherosclerosis ([place]) 07/27/2014  . Neoplasm of uncertain behavior of skin 07/27/2014  . Impaired renal function 07/27/2014  . Urinary system disease 07/27/2014    Past Surgical History  Procedure Laterality Date  . Abdominal hysterectomy    . Tubal ligation      Current Outpatient Rx  Name  Route  Sig  Dispense  Refill  . azithromycin (ZITHROMAX) 250 MG tablet      Take 1 tab daily for 6 days   6 each   0   . budesonide (PULMICORT) 0.5 MG/2ML nebulizer solution   Nebulization   Take 2 mLs (0.5 mg total)  by nebulization 2 (two) times daily.   240 mL   5   . Respiratory Therapy Supplies (FULL KIT NEBULIZER SET) MISC   Does not apply   1 each by Does not apply route 2 (two) times daily.   1 each   0     Allergies Levaquin; Prednisone; and Albuterol  Family History  Problem Relation Age of Onset  . Colon cancer Mother   . Diabetes Sister     Social History Social History  Substance Use Topics  . Smoking status: Never Smoker   . Smokeless tobacco: None  . Alcohol Use: No    Review of Systems Constitutional: No fever/chills Eyes: No visual changes. ENT: No sore throat. Cardiovascular: Denies chest pain. Respiratory: Denies shortness of breath. Gastrointestinal: No abdominal pain.  No nausea, no vomiting.  No diarrhea.  No constipation. Genitourinary: Negative for dysuria. Musculoskeletal: Negative for back pain. Skin: Negative for rash. Neurological: Negative for headaches, focal weakness or numbness.  10-point ROS otherwise negative.  ____________________________________________   PHYSICAL EXAM:  VITAL SIGNS: ED Triage Vitals  Enc Vitals Group     BP 02/23/15 1407 122/44 mmHg     Pulse Rate 02/23/15 1407 50     Resp 02/23/15 1407 18     Temp 02/23/15 1407 99.1 F (37.3 C)  Temp Source 02/23/15 1407 Oral     SpO2 02/23/15 1407 92 %     Weight 02/23/15 1407 153 lb (69.4 kg)     Height 02/23/15 1407 _0  (1.651 m)     Head Cir --      Peak Flow --      Pain Score 02/23/15 1408 0     Pain Loc --      Pain Edu? --      Excl. in [place]? --     Constitutional: Alert and oriented. Well appearing and in no acute distress. Eyes: Conjunctivae are normal. PERRL. EOMI. Head: Atraumatic. Nose: No congestion/rhinnorhea. Mouth/Throat: Mucous membranes are moist.  Oropharynx non-erythematous. Neck: No stridor.  Cardiovascular: Normal rate, regular rhythm. Grossly normal heart sounds.  Good peripheral circulation. Respiratory: Normal respiratory effort.  No  retractions. Lungs CTAB. She does however have a bad cough. Gastrointestinal: Soft and nontender. No distention. No abdominal bruits. No CVA tenderness. Musculoskeletal: No lower extremity tenderness nor edema.  No joint effusions. Neurologic:  Normal speech and language. No gross focal neurologic deficits are appreciated. No gait instability. Skin:  Skin is warm, dry and intact. No rash noted. Psychiatric: Mood and affect are normal. Speech and behavior are normal.  ____________________________________________   LABS (all labs ordered are listed, but only abnormal results are displayed)  Labs Reviewed  BASIC METABOLIC PANEL - Abnormal; Notable for the following:    Glucose, Bld 113 (*)    GFR calc non Af Amer 55 (*)    All other components within normal limits  CBC - Abnormal; Notable for the following:    HCT 47.2 (*)    All other components within normal limits  GLUCOSE, CAPILLARY - Abnormal; Notable for the following:    Glucose-Capillary 113 (*)    All other components within normal limits  URINALYSIS COMPLETEWITH MICROSCOPIC ([facility] ONLY)  TROPONIN I  BRAIN NATRIURETIC PEPTIDE  CBG MONITORING, ED   ____________________________________________  EKG  EKG read and interpreted by me shows normal sinus rhythm at a rate of 82. There are some PACs. There is right bundle-branch block. It looks similar to one done on 02/20/2015. X-ray was not ____________________________________________  RADIOLOGY  Asked x-ray was not done as patient left ____________________________________________   PROCEDURES    ____________________________________________   INITIAL IMPRESSION / ASSESSMENT AND PLAN / ED COURSE  Pertinent labs & imaging results that were available during my care of the patient were reviewed by me and considered in my medical decision making (see chart for details).   ____________________________________________   FINAL CLINICAL IMPRESSION(S) / ED  DIAGNOSES  Final diagnoses:  Lightheaded      Nena Polio, MD 02/23/15 [phone]

## 2015-02-23 NOTE — ED Notes (Signed)
Patient states that she is experiencing dizziness that started yesterday after starting her antibiotic, patient was seen in our ED 2 days ago and admitted for respiratory infection. Patient was released yesterday.

## 2015-02-23 NOTE — ED Notes (Signed)
Pt presents with dizziness and weakness since about an hour ago. Pt states she only has this when she stands up. Pt states was recently discharged last night to home .

## 2015-02-23 NOTE — ED Notes (Signed)
Assisted pt to bathroom. Refused to use bedpan as recommended by primary RN.

## 2015-02-23 NOTE — ED Notes (Signed)
RN walked in to check on patient, patient was out of bed and getting dressed. Patient stated that she was going home.RN informed patient that I had spoke with MD about when he would be in to see her and that per MD she was the next patient to be seen. Patient stated that she would wait and see the MD.

## 2015-02-24 ENCOUNTER — Other Ambulatory Visit: Payer: Self-pay

## 2015-02-24 NOTE — Patient Outreach (Signed)
[facility]) Care Management  02/24/2015  Yareny Wiggers [facility] 1929-10-11 [id number]     Outreach call to check on patient and follow up on recent ER and hospital visits. Patient was admitted to hospital on 02/20/15-02/21/15. She then presented back to the ER on 02/23/15 but walked out of ER before being cared for. Patient has been to ER multiple times within the last two weeks.Spoke with patient who reported she was at [facility] and could not talk at present. Patient was agreeable to call back at another time.  Plan: RN CM to attempt outreach call to patient again.  Enzo Montgomery, RN,BSN,CCM [facility] Telephonic Care Management Coordinator Direct Phone: [phone] Toll Free: [phone] Fax: [phone]

## 2015-02-24 NOTE — Patient Outreach (Signed)
[facility]) Care Management  02/24/2015  Preshus Alarie [facility] 1930/03/05 [id number]     Call placed back to patient. Patient reached. Patient shares recent ER and hospital visits. She states she was not feeling well and weak. She did call 24 hr nurse line but  patient reported "nurse acted like she didn't know what I was talking about." Therefore, she hung up on the nurse. Patient went to ER on yesterday. She shares how she left before being cleared to discharge home. She states she left because she was "claustrophobic" and "they raised both sides of the hospital bed rails." Patient states she was also upset by the fact she was told to use a bedpan. Patient states she is confused about what's going on with her lungs as one MD has said she had PNA and another said she did not. She does state that she completed antibiotic therapy for PNA and denies any symptoms at present. Patient confirmed that her next PCP appt was 03/11/14.   Plan: RN CM advised patient that [facility] will follow up with her and contact her to arrange home visit. RN CM confirmed that patient is aware of [name] 24 hr Nurse Line and how and when to contact. RN CM educated patient on appropriate ED usage.  Enzo Montgomery, RN,BSN,CCM [facility] Telephonic Care Management Coordinator Direct Phone: [phone] Toll Free: [phone] Fax: [phone]

## 2015-02-28 ENCOUNTER — Other Ambulatory Visit: Payer: Self-pay | Admitting: *Deleted

## 2015-02-28 ENCOUNTER — Encounter: Payer: Self-pay | Admitting: Internal Medicine

## 2015-02-28 ENCOUNTER — Ambulatory Visit (INDEPENDENT_AMBULATORY_CARE_PROVIDER_SITE_OTHER): Payer: Medicare Other | Admitting: Internal Medicine

## 2015-02-28 VITALS — BP 140/80 | HR 55 | Ht 65.0 in | Wt 158.0 lb

## 2015-02-28 DIAGNOSIS — F39 Unspecified mood [affective] disorder: Secondary | ICD-10-CM

## 2015-02-28 DIAGNOSIS — J4530 Mild persistent asthma, uncomplicated: Secondary | ICD-10-CM | POA: Diagnosis not present

## 2015-02-28 DIAGNOSIS — I491 Atrial premature depolarization: Secondary | ICD-10-CM | POA: Diagnosis not present

## 2015-02-28 DIAGNOSIS — I499 Cardiac arrhythmia, unspecified: Secondary | ICD-10-CM

## 2015-02-28 DIAGNOSIS — J189 Pneumonia, unspecified organism: Secondary | ICD-10-CM

## 2015-02-28 NOTE — Progress Notes (Signed)
Date:  02/28/2015   Name:  Allison Snyder   DOB:  20-Aug-1929   MRN:  [id number]   Chief Complaint: Hospitalization Follow-up Pneumonia She complains of chest tightness, cough, difficulty breathing and shortness of breath. There is no wheezing. The current episode started in the past 7 days. The problem occurs rarely. The problem has been resolved. The cough is non-productive. Pertinent negatives include no chest pain, fever or headaches. She reports complete improvement on treatment. Her past medical history is significant for COPD.   Patient was hospitalized at [facility] with pulmonary edema and pneumonia on 02/20/2015 and discharged on 02/21/2015.  Shortness of Breath - patient has episodes of shortness of breath which scare her. She has been to the emergency room on several occasions over the past month. Most recently they thought she might have mild CHF so kept her overnight and diuresed her with Lasix. They discharged her on Z-Pak. She says that she feels much better. She has been referred to the heart failure clinic and has an appointment on January 11.  Depression - Patient admits to feeling depressed and sad at times. She was offered medication while in the hospital but declined. She does not want to take medication for it is not necessary. She would consider counseling. She states that most of her stresses are due to family. Currently her granddaughter and 2 small children are living with her which contribute to her stress. She reports taking Valium for many years ago but has never been on antidepressants.   Review of Systems  Constitutional: Positive for fatigue. Negative for fever and diaphoresis.  Respiratory: Positive for cough and shortness of breath. Negative for apnea, chest tightness and wheezing.   Cardiovascular: Negative for chest pain, palpitations and leg swelling.  Gastrointestinal: Negative for nausea, abdominal pain and blood in stool.  Neurological: Negative for  dizziness and headaches.  Psychiatric/Behavioral: Positive for sleep disturbance and dysphoric mood. Negative for suicidal ideas. The patient is nervous/anxious.     Patient Active Problem List   Diagnosis Date Noted  . Pulmonary edema 02/20/2015  . Breast mass, left 12/11/2014  . Bilateral renal cysts 09/23/2014  . Asthma, mild persistent 07/27/2014  . Aortic atherosclerosis ([place]) 07/27/2014  . Neoplasm of uncertain behavior of skin 07/27/2014  . Impaired renal function 07/27/2014  . Urinary system disease 07/27/2014    Prior to Admission medications   Medication Sig Start Date End Date Taking? Authorizing Provider  budesonide (PULMICORT) 0.5 MG/2ML nebulizer solution Take 2 mLs (0.5 mg total) by nebulization 2 (two) times daily. 12/23/14  Yes Glean Hess, MD  Respiratory Therapy Supplies (FULL KIT NEBULIZER SET) MISC 1 each by Does not apply route 2 (two) times daily. 12/23/14  Yes Glean Hess, MD    Allergies  Allergen Reactions  . Levaquin [Levofloxacin] Shortness Of Breath  . Prednisone Shortness Of Breath  . Albuterol Anxiety    Past Surgical History  Procedure Laterality Date  . Abdominal hysterectomy    . Tubal ligation      Social History  Substance Use Topics  . Smoking status: Never Smoker   . Smokeless tobacco: None  . Alcohol Use: No     Medication list has been reviewed and updated.   Physical Exam  Constitutional: She appears well-developed and well-nourished.  Neck: Normal range of motion. Neck supple. No thyromegaly present.  Cardiovascular: Normal heart sounds.  An irregular rhythm present. Bradycardia present.   Pulmonary/Chest: Effort normal and breath sounds normal.  No respiratory distress. She has no wheezes.  Psychiatric: Her speech is normal. Cognition and memory are normal. She exhibits a depressed mood.  Nursing note and vitals reviewed.   BP 169/72 mmHg  Pulse 60  Ht _0  (1.651 m)  Wt 158 lb (71.668 kg)  BMI 26.29  kg/m2  Assessment and Plan: 1. Cardiac arrhythmia, unspecified cardiac arrhythmia type Irregularly irregular exam is frequent PACs I wonder if her shortness of breath might be related to episodes of PACs that have not been noticed by the patient We will await recommendation of cardiology - EKG 12-Lead  2. Finding of multiple premature atrial contractions by electrocardiography As above; may be causing episodic shortness of breath  3. Mood disorder [facility]) Discussed counseling and therapy as well as medication Patient is not interested in medication at this time; however I will send a note to [facility] and care management to request social worker.  4. CAP (community acquired pneumonia) resolved  5. Asthma, mild persistent, uncomplicated Continue Budesonide nebulizer  Halina Maidens, MD [facility]  02/28/2015

## 2015-02-28 NOTE — Patient Instructions (Signed)
Your appointment with Heart Failure Clinic is in the [place] [facility].

## 2015-02-28 NOTE — Patient Outreach (Signed)
RNCM called pt to set up an appointment to visit her at her home. Pt extremely talkative. Discussed with RNCM living situation and multiple deaths in her family over the past year year which included her spouse, 2 brother in laws, a daughter in law(suicide) and her first great grandson (suicide). Pt stating she was really struggling with the grief process and anxiety. RNCM attempted to teach pt pursed lip breathing technique over the phone but did not feel pt fully comprehended instructions. RNCM discussed exercise/walking as an alternative to antidepression medication because pt did not want to take medicine. RNCM also talked with pt about  Grief counseling offered through hospice in our area. Pt agreeable for RNCM to come out to pt's home for a home visit next week.  Plan: RNCM will see pt at her home 12/27 @ 2pm.  Merlene Morse Genene Kilman RN, BSN  [facility] Care Management [phone])

## 2015-03-04 ENCOUNTER — Other Ambulatory Visit: Payer: Self-pay | Admitting: *Deleted

## 2015-03-04 ENCOUNTER — Encounter: Payer: Self-pay | Admitting: *Deleted

## 2015-03-04 VITALS — BP 100/60 | HR 70 | Resp 16 | Wt 155.0 lb

## 2015-03-04 DIAGNOSIS — E678 Other specified hyperalimentation: Secondary | ICD-10-CM

## 2015-03-04 NOTE — Patient Outreach (Signed)
[name]) Care Management   03/04/2015  Keanu Frickey [facility] 1930-02-28 [id number]  Allison Snyder is an 81 y.o. female  Subjective: "I have lost my husband, 2 brother in laws and my daughter in law and 40 year old grandson killed themselves." "I had to move from the home I shared with my husband to here and then 2 months ago my granddaughter and her two little boys moved in." "It is stressful around here because she doesn't raise her kids the same as I raised mine." "I get panicked and feel like I can't breath and the only thing that helps in oxygen." "I have been taking all these vitamins for years and when I start feeking sick I take a handful of vitamin c."   Objective:   Review of Systems  Constitutional: Positive for weight loss.  Cardiovascular: Positive for leg swelling.  Gastrointestinal: Positive for constipation.  Musculoskeletal: Positive for back pain and falls.  Neurological: Positive for dizziness.  Psychiatric/Behavioral: Positive for depression and memory loss. The patient is nervous/anxious.     Physical Exam  Constitutional: She is oriented to person, place, and time. She appears well-developed and well-nourished.  Cardiovascular: Normal rate, regular rhythm and intact distal pulses.   Pulses:      Radial pulses are 2+ on the right side, and 2+ on the left side.       Dorsalis pedis pulses are 2+ on the right side, and 2+ on the left side.  Respiratory: Effort normal and breath sounds normal.  GI: Soft. Bowel sounds are normal.  Musculoskeletal: Normal range of motion.       Thoracic back: She exhibits pain.       Right lower leg: She exhibits edema.       Left lower leg: She exhibits edema.  Mild edema to bilateral lower legs. Pt c/o pain to mid back when she becomes anxious or overly tired, which subsides with rest.   Neurological: She is alert and oriented to person, place, and time. She has normal strength.  Skin: Skin is warm, dry and  intact.  Pt with several large verucose veins to bilateral lower legs.   Denies rashes, but granddaughter reports pt with recent bedbugs in her bed.   Psychiatric: Her speech is normal. Her mood appears anxious. She is hyperactive. She exhibits abnormal recent memory.  Pt stating she has a hard time with her memory and has recently been losing her keys credit card and forgetting her pin #.     Current Medications:   Current Outpatient Prescriptions  Medication Sig Dispense Refill  . budesonide (PULMICORT) 0.5 MG/2ML nebulizer solution Take 2 mLs (0.5 mg total) by nebulization 2 (two) times daily. 240 mL 5  . Respiratory Therapy Supplies (FULL KIT NEBULIZER SET) MISC 1 each by Does not apply route 2 (two) times daily. 1 each 0   No current facility-administered medications for this visit.    Functional Status:   In your present state of health, do you have any difficulty performing the following activities: 02/20/2015 09/24/2014  Hearing? N Y  Vision? N Y  Difficulty concentrating or making decisions? N Y  Walking or climbing stairs? Y Y  Dressing or bathing? N N  Doing errands, shopping? N N    Fall/Depression Screening:    PHQ 2/9 Scores 02/20/2015 09/24/2014  PHQ - 2 Score 0 0   Fall Risk  03/04/2015 02/20/2015 09/24/2014  Falls in the past year? Yes No No  Number  falls in past yr: 1 - -  Injury with Fall? No - -  Risk for fall due to : History of fall(s);Other (Comment) - -  Risk for fall due to (comments): pt stated she gets dizzy at times upon standing - -  Follow up Falls prevention discussed - -    Assessment:  Arrived at pt's home and pt was outside ambulating, checking on her great-grandson. Home noted to be in fair repair, at the end of [facility] visit granddaughter reporting they have been treating the house for bed bugs for the last month with partial success, related to strong odors causing asthma symptoms.    Orthostatic hypotension: Pt complaining of feeling like she  was "going blind" when she stands and also feels dizzy. Pt had recorded her b/p with automated cuff sitting and standing. Standing pt's cuff got 84/48, with pt stating she felt dizzy. RNCM checked b/p pt while sitting and obtained 100/60 and obtained 84/50 when pt stood. Pt reported she had only had 1/2 bottle of water all day but had eaten a small bowl of cereal with soy milk, and a small lunch. RNCM suggested pt have more to drink and recheck b/p at end of visit. Pt drank 16oz of water and RNCM rechecked b/p. B/P 116/68 sitting, b/p 110/60 standing after water. Pt also denied symptoms of "going blind" and feeling dizzy. Pt reports she does not like water and does not drink many fluids during the day. Pt also reported to [facility] she takes lots of vitamins and herbal supplements that she has not informed her MD about related to "they haven't asked" Granddaughter reported when her grandmother gets sick she will take handfuls of vitamins to get well. (Pt agreed with granddaughters statement) RNCM requested pt check her b/p q day and record to assist MD at HF clinic to properly manage HF symptoms. Pt verbalized agreement, [name] log book given for recording. Also asked pt to record daily weights and put appts in pt's calendar to assist in appointment reminders. RNCM requested pt drink 3, 16oz bottles of water per day to assist with pt's b/p. Granddaughter stating pt hardly drinks and recently has had poor appetite.  Asthma:Lungs clear at this assessment bilaterally. Pt talked about her triggers as being weather, perfumes, cigarettes, and animals. Pt does have carpet. Does not smoke and is not exposed to 2nd hand smoke. Pt states she uses her nebulizer q day and has used it more on occasion. Pt describes SOB spells she has experienced recently as starting quickly, and once she gets to the ER and gets some 02 on she feels better. Pt states she will have mid back pain during these spells that feels better after lying down for  about 15 mins. Pt was unable to determine if spells were related to an upsetting event but granddaughter said that there had been right much stress in the home as of late. RNCM attempted to teach pt purse lipped breathing but pt unable to follow commands of instruction or demonstrate technique back.   Grieving:  Loss of daughter in law to suicide, loss of 2 brother in laws, loss of husband, loss of grandson 53 years old to suicide, all in 2016. Pt also states she lost her home and had to move away from the home she and her husband of 12 years had shared. She also stated her granddaughter had to start living with her in the last 2 months. Pt reports before that she was crying a lot.  Pt states coping techniques have been babysitting great grandson, house wok and working puzzles. Pt was tearful talking about these losses. RNCM offered to assist pt in setting up grief counseling through [facility] of [place]. Pt agreeable. RNCM made the call and counselor wanted to speak with pt. Pt made appointment for counselor to visit her home on 12/29. Pt adamantly stating she did not want to be on medication for depression or anxiety.   Heart Failure: Pt reminded of HF clinic appt. Granddaughter also made aware of appointment date and time. Pt noted to have mild edema to bilat lower extremities. Reporting moderate appetite loss but denies nausea. SOB only with "spells". Pt stated she will start daily weights.  Constipation: Pt states she has to take 6 exlax tablets per day to have a daily BM. She stated if she forgets she will become extremely constipated.  Memory Issues: Pt stating she has been struggling to remember things. Granddaughter stating pt forgets where she puts her credit card a lot. Pt reports she had a moment at the bank she could not remember her pin # and it is a very common number she has been using for years. Pt stated she is worried about this memory loss because she saw her  husband suffer with dementia.    Granddaughter reports pt is always moving, cleaning, washing or checking on her great-grandson. Granddaughter reports pt becomes very claustrophobic when she is closed in a space or if people are too close. Granddaughter let RNCM know she herself suffers with Asthma, ADHD, and Asperger's and so does her son who lives in the home which causes stress in the home. Granddaughter and pt eluded to a social service investigation ongoing involving granddaughter's other son, but did not disclose details except it was causing tension and stress in the home. Granddaughter also reports she is struggling caring for her grandmother, her autistic son and herself.   Pt reports she would like to have follow up about recent cyst/tumor seen in her breast in February. She stated she had too much going on right now to deal with it this month.      Plan: RNCM will see pt again in 2 weeks to evaluate progress with goals. RNCM will request [facility] Pharmacist look at pt's vitamin list to evaluate for potential risks.  RNCM will send a referral to [facility] to evaluate pt for further counseling when Hospice is complete.  RNCM will investigate proper treatment on bedbugs and make granddaughter aware.   Primrose Oler RN, BSN  [facility] Care Management [phone])  [facility] CM Care Plan Problem One        Most Recent Value   Care Plan Problem One  Pt would like to be able to manage her stress and anxiety without panicing and feeling the need to go to the ED.   Role Documenting the Problem One  Care Management [place] for Problem One  Active   [name] Long Term Goal (31-90 days)  Pt will not go to the emergency room in the next 90 days.    [name] Long Term Goal Start Date  03/04/15   Interventions for Problem One Long Term Goal  RNCM made initial home visit to assess pt's needs. Purse lip breathing taught to assist pt in controlled breathing when anxious.    [name] CM Short Term Goal #1  (0-30 days)  Pt will take and record b/p for MD for the next 30  days.    [name] CM Short Term Goal #1 Start Date  03/04/15   Interventions for Short Term Goal #1  RNCM provided pt with [name] log book and assisted pt in recording b/p in log book. RNCM educated pt on the importance of being hydrated to reduce the riisk of low b/p.   [name] CM Short Term Goal #2 (0-30 days)  Pt will follow through with set up appointments with greif counselor, heart failure MD, and pcp in the next 30 days.   [name] CM Short Term Goal #2 Start Date  03/04/15   Interventions for Short Term Goal #2  RNCM wrote all appts in pt's calendar. RNCM made granddaughter aware of all appts. RNCM educated pt on the importance of attending doctors appointments. RNCM assisted pt in setting up grief counseling appt.      [name] CM Care Plan Problem Two        Most Recent Value   Care Plan Problem Two  Pt has had several close relatives die in the last year , including a spouse. She has had to move, and now has family lliving with her, she does not feel she is handling all of the transitions and stress well.    Role Documenting the Problem Two  Care Management Coordinator   Care Plan for Problem Two  Active   [name] CM Short Term Goal #1 (0-30 days)  Pt will complete grief counseling through hospice to assist her in the geiving process.   [name] CM Short Term Goal #1 Start Date  03/04/15   Interventions for Short Term Goal #2   RNCM educated pt on the different needs people experience when dealing with death and transition. RNCM called Hospice and assisted pt in setting up grief counselor to come to her home.

## 2015-03-12 ENCOUNTER — Ambulatory Visit: Payer: Medicare Other

## 2015-03-17 ENCOUNTER — Other Ambulatory Visit: Payer: Self-pay | Admitting: *Deleted

## 2015-03-17 DIAGNOSIS — Z598 Other problems related to housing and economic circumstances: Secondary | ICD-10-CM

## 2015-03-17 DIAGNOSIS — Z599 Problem related to housing and economic circumstances, unspecified: Secondary | ICD-10-CM

## 2015-03-17 NOTE — Patient Outreach (Signed)
RNCM called pt as part of the transition of care program. RNCM noted a lot of back ground noise and pt was distracted by great grandson. Pt denied having any spells of SOB and also stated since she had increased her water intake she had not had any episodes of feeling like "she was going blind" when she stood up. Pt read RNCM her blood pressures from the log she had been keeping and her weights. Pt denies edema.  Today 156/85 67 sitting 112/74 69 standing  90/58 100 standing 118/58 90 9am sitting 147/76 75 sitting 116/71 89 standing  152/ 78 64 sitting   116/ 71 89 standing 147/76 75 sitting  Weights: Today:155.6  156.0  161.2  159.2   Pt stated the grief counselor came and pt unsure when counselor coming back. Pt needing number for the counselor to confirm next appointment time. RNCM gave pt the number. Pt also wanted the number to the HF Clinic to reschedule her appointment.  No heat but only individual heaters, has 3 heaters this year. Talked with pt about the issue of bed bugs in the home. Pt stated they had looked into an exterminator coming out to assist with getting rid of the bugs but it was going to cost 500$ and they did not have the money. Pt stated they were spraying with tee-tree oil and washing things constantly to try to rid the house of the bugs. RNCM let pt know she would mail her some information related to getting rid of bedbugs. Pt stated it had caused her anxiety in the past related to the bugs biting the children that lived in the home. Pt stated right now the bugs didn't seem as bad as they did a couple of months ago.  Made a plan with pt to call her next week as part of the transition of care program.   Plan: RNCM will call pt next week as continued part of the transition of care program.  Merlene Morse Camari Quintanilla RN, BSN  [facility] Care Management [phone]

## 2015-03-18 ENCOUNTER — Ambulatory Visit: Payer: Medicare Other

## 2015-03-19 ENCOUNTER — Ambulatory Visit: Admitting: Family

## 2015-03-19 ENCOUNTER — Other Ambulatory Visit: Payer: Self-pay | Admitting: Pharmacist

## 2015-03-19 NOTE — Patient Outreach (Signed)
Allison Snyder is a 82 y.o. female referred to pharmacy for medication management related to her taking a large number of vitamins. Call to speak with Allison Snyder regarding her vitamin regimen. Patient tells me that her vitamins are "very necessary. Wouldn't quit taking any of them for nothing."   Talk with patient about getting vitamins from her food through good nutrition. Patient reports that she has not been eating well since the loss of her husband, but that recently she has gotten better about eating. Reports that she has been trying to eat more fruits and vegetables. Congratulate patient on eating better and on eating more healthy foods. Patient also reports that she has been staying well hydrated, drinking lots of water.  Discuss with patient risks of taking vitamins at high doses, particularly fat soluble vitamins, such as A,D and E. Counsel patient about the daily recommended daily allowances of the vitamins that she is taking. Patient is very resistant to this discussion, as she reports that she gets the advice that she needs from the people at the vitamin store. Counsel patient that "natural supplements" are still medicine and that these still come with risks, side effects and interactions. Let her know about studies showing increased risk of bleeding with doses of Vitamin E greater than 400 IU/day.   Ask patient about whether she has discussed her vitamins with her PCP. Patient reports that she has not, as these are "natural" and she did not feel that her physician needed to know. Counsel patient about the importance of sharing this information with her PCP as these supplements can still have side effects, risks and drug interactions. Patient verbalizes understanding. Also let patient know that her physician can take levels of some of these vitamins in her body, such as Vitamin D, as patient reports that she is taking this because she is concerned that she might be deficient, but reports that  she has not had a level taken.  Also question patient about her use of Ex-Lax. Patient reports that she has been taking this for quite some time in order to keep her bowel movements regular. Reports that she needs between 4 to 6 doses/day in order to keep from being constipated. Counsel patient about the need for her to see her PCP to discuss this use and her constipation. Let her know that this use exceeds the recommended maximum for this medication and that she needs to be seen by her physician to discuss the potential causes of her constipation and the best management. Patient verbalizes understanding.    PLAN:  1) Patient states that she will call to schedule an appointment with her PCP to discuss her ongoing constipation, her supplement use and whether she needs to have any lab work done to assess her vitamin levels. Patient to bring all of her supplements and medications with her to this visit.  2) Will also reach out to patient's PCP, Dr. Army Melia, myself to let her know about the supplements that the patient has reported using and about her ongoing constipation.  Harlow Asa, PharmD Clinical Pharmacist [place] Management [phone]

## 2015-03-19 NOTE — Patient Outreach (Signed)
[facility]) Care Management  03/19/2015  Danele [facility] Aug 19, 1929 [id number]   Request received from [facility], LCSW to mail patient information on local pest control resources. Information mailed 03/19/15.   Jacqulynn Cadet  [facility] Care Management Assistant

## 2015-03-21 ENCOUNTER — Ambulatory Visit (INDEPENDENT_AMBULATORY_CARE_PROVIDER_SITE_OTHER): Payer: Medicare Other

## 2015-03-21 ENCOUNTER — Telehealth: Payer: Self-pay

## 2015-03-21 ENCOUNTER — Other Ambulatory Visit: Payer: Self-pay | Admitting: Pharmacist

## 2015-03-21 ENCOUNTER — Other Ambulatory Visit: Payer: Self-pay | Admitting: *Deleted

## 2015-03-21 DIAGNOSIS — Z23 Encounter for immunization: Secondary | ICD-10-CM | POA: Diagnosis not present

## 2015-03-21 NOTE — Patient Outreach (Signed)
Called patient's PCP, Dr. Gaspar Cola, office regarding the supplements that the patient has reported using and about her ongoing constipation. Left a message on her nurse's voicemail. If have not heard back by 03/24/15, will follow up again at that time.  Harlow Asa, PharmD Clinical Pharmacist [place] Management [phone]

## 2015-03-21 NOTE — Patient Outreach (Signed)
[facility]) Care Management  03/21/2015  Allison Snyder [facility] 05-08-1929 [id number]  Referral received from [facility] to assist patient with list of exterminators to address her bed bug infestation.  This Education officer, museum sent a request to the care management assistant to mail patient a list of local pest control companies that she might engage for assistance. There were no community resources identified to assist financially with this expense. Phone call to patient who reports that she has not been able to identify any resources to assist with this issue.  This Education officer, museum informed her that resources would be mailed to her home.  Plan:  Care manager assistant mailed this patient a list of pest control companies on 03/19/15.            This social worker will contact patient by the end of next week to ensure she has received the information.   Allison Snyder [facility] Care Management [phone]

## 2015-03-21 NOTE — Telephone Encounter (Signed)
[place] Pharmacist called in today stating that she would like for Dr. Army Melia to call her about Elleanna's medications. States that she has questions regarding her Chronic Constipation and various vitamins that patient is on. Elizabeth's CB number [phone]. Thanks

## 2015-03-24 ENCOUNTER — Ambulatory Visit: Payer: Self-pay | Admitting: *Deleted

## 2015-03-24 ENCOUNTER — Other Ambulatory Visit: Payer: Self-pay | Admitting: Pharmacist

## 2015-03-24 NOTE — Patient Outreach (Signed)
Received a call back from patient's PCP, Dr. Army Melia, regarding the supplements that the patient has reported using and about her ongoing constipation. Dr. Army Melia reports that she has reviewed my note from 03/19/15. Reports that patient has not previously shared with her that the patient is taking these supplements or about her constipation. Let provider know that Nurse Care Manager Janci Minor is also following this patient.  Note that per EPIC, patient did make an appointment to see Dr. Army Melia for 03/26/15. Let Dr. Army Melia know that I asked the patient to bring all of her medications, including supplements, in with her to this visit. Dr. Army Melia reports that she will discuss with the patient both her supplements and her ongoing constipation at this visit.  Will close pharmacy episode for now.   Harlow Asa, PharmD Clinical Pharmacist [place] Management [phone]

## 2015-03-26 ENCOUNTER — Ambulatory Visit: Payer: Medicare Other | Admitting: Internal Medicine

## 2015-03-27 ENCOUNTER — Ambulatory Visit: Admitting: Family

## 2015-03-28 ENCOUNTER — Other Ambulatory Visit: Payer: Self-pay | Admitting: *Deleted

## 2015-03-28 DIAGNOSIS — I509 Heart failure, unspecified: Secondary | ICD-10-CM

## 2015-03-28 NOTE — Patient Outreach (Signed)
[facility]) Care Management  03/28/2015  Quinnetta Roepke [facility] [birth year] [id number]   Phone call to patient confirm that she received the list of exterminators sent to her by mail. Per patient, she has received this however has also contacted her own [employer] who has agreed to spray for bed bug for $500.00, however she would have to clear out all of her things before they will spray. i.e pictures off the wall, clothes out of drawers etc.  Per patient, she will have this done at the first of February.  Plan:  This social worker will contact [employer] to confirm plan to spray patient's home for bed bugs and that technique will be effective.    [facility] CM Care Plan Problem One        Most Recent Value   Care Plan Problem One  patient has bed bugs   Role Documenting the Problem One  Clinical Social Worker   Care Plan for Problem One  Not Active   [name] CM Short Term Goal #1 (0-30 days)  patient will verbalize receipt of  pest control resources sent within the next 30 days   [name] CM Short Term Goal #1 Start Date  03/21/15   [facility] CM Short Term Goal #1 Met Date  03/28/15   Interventions for Short Term Goal #1  request made for care management assistant to mail patient list of pest control companies for her use to address bed bug issue   [name] CM Short Term Goal #2 (0-30 days)  [employer] to be contacted to confirm theri plan to spray patient's home for bed bugs  within the next 30days   [name] CM Short Term Goal #2 Start Date  03/28/15   Interventions for Short Term Goal #2  discussed with patient importance of  confirming  the  plan to exterminate for bed bugs and to ensure that technique used will be effective     [place], [place] Management [phone]

## 2015-03-28 NOTE — Patient Outreach (Signed)
RNCM called pt as part of transition of care program. RNCM inquired of pt how she had been feeling. Pt stated she was feeling fine and had increased her water intake which had helped her not have the dizzy spells that she was having before. Pt denies any further falls. RNCM talked with pt about her cancelling her recent doctor's appointment with her primary care MD and the pt stated she felt like "someone was starting a war on the vitamins I take." RNCM explained to pt the importance of letting the Md know about all of her vitamins to ensure she was not taking too much and to prevent any interactions with other ordered medications. Pt stated she spoke with the representative at the health food store and felt confident in his advice. RNCM attempted with a few more approaches to recommend pt see MD for blood levels of vitamins to ensure proper use, but pt was not open to it.   RNCM talked with pt about her b/p and pt stated it had been fine since she started drinking the water. Pt stated she had not had any further spells of SOB. Denies swelling, or weight gain. Pt stated when she feels her pants getting tight she realizes she needs to have a bm and takes her laxative. RNCM asked pt if she had talked with her doctor about her constipation and she stated she could not remember. RNCM encouraged pt to talk with her about this issue.   RNCM inquired if pt had f/u with the grief counselor, and pt stated she had not. She stated she had lost the number. RNCM gave pt the number again. Pt noted she may call, she was not sure.  RNCM discussed with pt the bedbugs in her home and pt stated they were now dealing with lice also. Pt's grandson was sent home from school with lice. Pt stated she and the grandson have treated their heads with the provided shampoo and plan to treat again as directed. Pt stated she had heard from [facility] SW related to resources to assist with bedbugs.   RNCM talked with pt about a follow up home  visit and pt stated she did not feel this was necessary. Pt stated she will see the HF MD at the end of the month as long as her car is working again. Pt stated she had some transportation issues but is hoping they will resolve before appointment.    Plan: RNCM will make [facility] SW aware of pt's transportation Issues.  RNCM will send an order for health coach related to further heart failure teaching.  RNCM will make SW aware pt is closed to [facility].  Rutherford Limerick RN, BSN  [facility] Care Management [phone])

## 2015-03-31 ENCOUNTER — Other Ambulatory Visit: Payer: Self-pay | Admitting: *Deleted

## 2015-03-31 NOTE — Patient Outreach (Signed)
[place] [facility]) Care Management  03/31/2015  Allison Snyder [facility] 10-Jul-1929 [id number]    Phone call to [place] with patient's permission to discuss plan to exterminate patient's home for bed bugs.  Voicemail message left for a return call.   Sheralyn Boatman [facility] Care Management [phone]

## 2015-04-01 ENCOUNTER — Other Ambulatory Visit: Payer: Self-pay | Admitting: *Deleted

## 2015-04-01 NOTE — Patient Outreach (Addendum)
[place] [facility]) Care Management  04/01/2015  Larena Desaulniers [facility] [birth year] [id number]   Return phone call from [employer] on 03/31/15.  Spoke with [employer] who confirmed that he would be able to exterminate patient's home for the bed bugs for $500.00.  She would have to remove everything from the walls, drawers and bed.  Per Mr. Lovena Le, he will treat her home for bed bugs and then will re-treat if needed at least two more times, waiting 2 weeks in between treatments at no additional cost.  Per Mr, Lovena Le, the products use should not aggravate her asthma, however she would need to be outside of the treated area for at least 2 hours.  Phone call to patient, transportation needs explored.  Per patient, she has no transportation needs at this time as she is still able to drive her car to appointments.  Per patient, she will contact this social worker if any transportation needs arise.  Per patient, she plans to have her home exterminated at the first of next month.     Plan:  Patient has a plan to treat the bed bugs, patient also has list of alternative pest control companies if needed.            Patient verbalized having no transportation needs at this time           Patient verbalized no further social work needs at this time   [place] [place] Management [phone]

## 2015-04-02 ENCOUNTER — Other Ambulatory Visit: Payer: Self-pay | Admitting: *Deleted

## 2015-04-04 NOTE — Patient Outreach (Signed)
[place] [facility]) Care Management  04/02/2015  Allison Snyder [facility] 1929-05-24 [id number]   RN Health Coach telephone call to patient.  [employer] compliance verified. RN Health Coach  introduced self to patient. Per  patient  Her blood pressure  was 153/79  pulse 54 sitting. Per patient her blood pressure was 154/91 pulse 67 standing.  Patient was very agreeable to follow up out reach calls. Patient was referred by  [place] Nurse.  Plan: RN Health Coach will do follow up call within a month for assessment. Patient agreed to follow up outreach call  [place] Management [phone]

## 2015-04-07 ENCOUNTER — Encounter: Payer: Self-pay | Admitting: Family

## 2015-04-07 ENCOUNTER — Ambulatory Visit: Payer: Medicare Other | Attending: Family | Admitting: Family

## 2015-04-07 VITALS — BP 164/63 | HR 70 | Resp 20 | Ht 65.0 in | Wt 160.0 lb

## 2015-04-07 DIAGNOSIS — Z888 Allergy status to other drugs, medicaments and biological substances status: Secondary | ICD-10-CM | POA: Insufficient documentation

## 2015-04-07 DIAGNOSIS — Z9889 Other specified postprocedural states: Secondary | ICD-10-CM | POA: Insufficient documentation

## 2015-04-07 DIAGNOSIS — I5032 Chronic diastolic (congestive) heart failure: Secondary | ICD-10-CM | POA: Insufficient documentation

## 2015-04-07 DIAGNOSIS — I8393 Asymptomatic varicose veins of bilateral lower extremities: Secondary | ICD-10-CM | POA: Insufficient documentation

## 2015-04-07 DIAGNOSIS — J45909 Unspecified asthma, uncomplicated: Secondary | ICD-10-CM | POA: Diagnosis not present

## 2015-04-07 DIAGNOSIS — I10 Essential (primary) hypertension: Secondary | ICD-10-CM | POA: Diagnosis not present

## 2015-04-07 DIAGNOSIS — Z79899 Other long term (current) drug therapy: Secondary | ICD-10-CM | POA: Insufficient documentation

## 2015-04-07 NOTE — Patient Instructions (Addendum)
Begin weighing daily and call for an overnight weight gain of > 2 pounds or a weekly weight gain of >5 pounds. 

## 2015-04-07 NOTE — Progress Notes (Signed)
Subjective:    Patient ID: Allison Snyder, female    DOB: 1929/08/06, 82 y.o.   MRN: [id number]  Congestive Heart Failure Presents for initial visit. The disease course has been stable. Associated symptoms include fatigue (improving). Pertinent negatives include no abdominal pain, chest pain, edema, orthopnea, palpitations or shortness of breath. The symptoms have been stable. Past treatments include nothing. The treatment provided mild relief. There is no history of CAD, CVA or DM. Compliance with total regimen is 76-100%.  Hypertension This is a chronic problem. The current episode started more than 1 year ago. Associated symptoms include anxiety. Pertinent negatives include no chest pain, headaches, neck pain, palpitations, peripheral edema or shortness of breath. There are no associated agents to hypertension. Risk factors for coronary artery disease include post-menopausal state. Past treatments include diuretics and lifestyle changes. The current treatment provides mild improvement. Compliance problems include medication cost.  Hypertensive end-organ damage includes heart failure.    Past Medical History  Diagnosis Date  . Asthma   . History of appendectomy   . H/O tubal ligation   . Gallbladder & bile duct stone with obstruction     issues off and on  . Hypoglycemic syndrome   . CHF (congestive heart failure) [facility])     Past Surgical History  Procedure Laterality Date  . Abdominal hysterectomy    . Tubal ligation    . Appendectomy      Family History  Problem Relation Age of Onset  . Colon cancer Mother   . Diabetes Sister     Social History  Substance Use Topics  . Smoking status: Never Smoker   . Smokeless tobacco: Never Used  . Alcohol Use: No    Allergies  Allergen Reactions  . Levaquin [Levofloxacin] Shortness Of Breath  . Prednisone Shortness Of Breath  . Albuterol Anxiety    Prior to Admission medications   Medication Sig Start Date End Date  Taking? Authorizing Provider  b complex vitamins capsule Take 1 capsule by mouth daily.   Yes Historical Provider, MD  budesonide (PULMICORT) 0.5 MG/2ML nebulizer solution Take 2 mLs (0.5 mg total) by nebulization 2 (two) times daily. 12/23/14  Yes Glean Hess, MD  cholecalciferol (VITAMIN D) 1000 UNITS tablet Take 5,000 Units by mouth daily.   Yes Historical Provider, MD  Multiple Minerals-Vitamins (CALCIUM CITRATE PLUS/MAGNESIUM) TABS Take 2 tablets by mouth daily.   Yes Historical Provider, MD  Omega-3 Fatty Acids (FISH OIL) 1000 MG CAPS Take 2,000 mg by mouth daily. Triple strength fish oil   Yes Historical Provider, MD  Pantothenic Acid 250 MG CAPS Take 250 mg by mouth daily.   Yes Historical Provider, MD  pyridoxine (B-6) 100 MG tablet Take 100 mg by mouth daily.   Yes Historical Provider, MD  Respiratory Therapy Supplies (FULL KIT NEBULIZER SET) MISC 1 each by Does not apply route 2 (two) times daily. 12/23/14  Yes Glean Hess, MD  Sennosides-Docusate Sodium (EX-LAX GENTLE STRENGTH PO) Take 6 capsules by mouth daily.   Yes Historical Provider, MD  vitamin C (ASCORBIC ACID) 500 MG tablet Take 500 mg by mouth daily.   Yes Historical Provider, MD  vitamin E 400 UNIT capsule Take 1,200 Units by mouth daily.   Yes Historical Provider, MD  vitamin A 10000 UNIT capsule Take 10,000 Units by mouth daily. Reported on 04/07/2015    Historical Provider, MD       Review of Systems  Constitutional: Positive for appetite change (decreased appetite)  and fatigue (improving).  HENT: Positive for rhinorrhea. Negative for congestion and sore throat.   Eyes: Negative.   Respiratory: Positive for cough (intermittently). Negative for chest tightness and shortness of breath.   Cardiovascular: Negative for chest pain, palpitations and leg swelling.  Gastrointestinal: Positive for constipation. Negative for abdominal pain and abdominal distention.  Endocrine: Negative.   Genitourinary: Negative.    Musculoskeletal: Negative for back pain and neck pain.       Varicose veins in lower legs  Skin: Negative.   Allergic/Immunologic: Negative.   Neurological: Negative for dizziness, light-headedness and headaches.  Hematological: Negative for adenopathy. Does not bruise/bleed easily.  Psychiatric/Behavioral: Positive for sleep disturbance (sleeping on 2 pillows. trouble staying asleep). Negative for dysphoric mood. The patient is nervous/anxious (worry about 71 yr old great-grandson).        Objective:   Physical Exam  Constitutional: She is oriented to person, place, and time. She appears well-developed and well-nourished.  HENT:  Head: Normocephalic and atraumatic.  Eyes: Conjunctivae are normal. Pupils are equal, round, and reactive to light.  Neck: Normal range of motion. Neck supple.  Cardiovascular: Normal rate and regular rhythm.   Pulmonary/Chest: Effort normal. She has no wheezes. She has no rales.  Abdominal: Soft. She exhibits no distension. There is no tenderness.  Musculoskeletal: She exhibits no edema or tenderness.  Neurological: She is alert and oriented to person, place, and time.  Skin: Skin is warm and dry.  Psychiatric: She has a normal mood and affect. Her behavior is normal. Thought content normal.  Nursing note and vitals reviewed.   BP 164/63 mmHg  Pulse 70  Resp 20  Ht _0  (1.651 m)  Wt 160 lb (72.576 kg)  BMI 26.63 kg/m2  SpO2 98%       Assessment & Plan:  1: Chronic heart failure with preserved ejection fraction- Patient presents with fatigue that she feels like is improving. She denies any shortness of breath or edema in her legs. She does weigh herself but admits that she doesn't weigh herself consistently. Discussed the importance of weighing herself on a daily basis and to call for an overnight weight gain of >2 pounds or a weekly weight gain of >5 pounds. She is not adding any salt to her food and says that she already tries to follow a low  sodium diet. Reviewed a 2024m sodium diet and written information was given to her about this.  2: HTN- Blood pressure is elevated here but she says that it's normally on the low side. Will continue to monitor but patient is not interested in taking any prescription medications. 3: Varicose veins- Patient has pronounced varicose veins in her legs. She is taking large amounts of vitamin E and feels like this is why her legs don't hurt. She is currently only taking vitamins/supplements and she was encouraged to followup with her PCP regarding all the supplements and the dosages that she's taking.   Return here in 1 month or sooner for any questions/problems before then.

## 2015-04-25 ENCOUNTER — Encounter: Payer: Self-pay | Admitting: Internal Medicine

## 2015-04-25 ENCOUNTER — Ambulatory Visit (INDEPENDENT_AMBULATORY_CARE_PROVIDER_SITE_OTHER): Payer: Medicare Other | Admitting: Internal Medicine

## 2015-04-25 VITALS — BP 118/70 | HR 66 | Temp 98.2°F | Ht 65.0 in | Wt 162.0 lb

## 2015-04-25 DIAGNOSIS — J069 Acute upper respiratory infection, unspecified: Secondary | ICD-10-CM | POA: Diagnosis not present

## 2015-04-25 LAB — POCT INFLUENZA A/B
INFLUENZA A, POC: NEGATIVE
Influenza B, POC: NEGATIVE

## 2015-04-25 NOTE — Patient Instructions (Signed)
Upper Respiratory Infection, Adult Most upper respiratory infections (URIs) are a viral infection of the air passages leading to the lungs. A URI affects the nose, throat, and upper air passages. The most common type of URI is nasopharyngitis and is typically referred to as "the common cold." URIs run their course and usually go away on their own. Most of the time, a URI does not require medical attention, but sometimes a bacterial infection in the upper airways can follow a viral infection. This is called a secondary infection. Sinus and middle ear infections are common types of secondary upper respiratory infections. Bacterial pneumonia can also complicate a URI. A URI can worsen asthma and chronic obstructive pulmonary disease (COPD). Sometimes, these complications can require emergency medical care and may be life threatening.  CAUSES Almost all URIs are caused by viruses. A virus is a type of germ and can spread from one person to another.  RISKS FACTORS You may be at risk for a URI if:   You smoke.   You have chronic heart or lung disease.  You have a weakened defense (immune) system.   You are very young or very old.   You have nasal allergies or asthma.  You work in crowded or poorly ventilated areas.  You work in health care facilities or schools. SIGNS AND SYMPTOMS  Symptoms typically develop 2-3 days after you come in contact with a cold virus. Most viral URIs last 7-10 days. However, viral URIs from the influenza virus (flu virus) can last 14-18 days and are typically more severe. Symptoms may include:   Runny or stuffy (congested) nose.   Sneezing.   Cough.   Sore throat.   Headache.   Fatigue.   Fever.   Loss of appetite.   Pain in your forehead, behind your eyes, and over your cheekbones (sinus pain).  Muscle aches.  DIAGNOSIS  Your health care provider may diagnose a URI by:  Physical exam.  Tests to check that your symptoms are not due to  another condition such as:  Strep throat.  Sinusitis.  Pneumonia.  Asthma. TREATMENT  A URI goes away on its own with time. It cannot be cured with medicines, but medicines may be prescribed or recommended to relieve symptoms. Medicines may help:  Reduce your fever.  Reduce your cough.  Relieve nasal congestion. HOME CARE INSTRUCTIONS   Take medicines only as directed by your health care provider.   Gargle warm saltwater or take cough drops to comfort your throat as directed by your health care provider.  Use a warm mist humidifier or inhale steam from a shower to increase air moisture. This may make it easier to breathe.  Drink enough fluid to keep your urine clear or pale yellow.   Eat soups and other clear broths and maintain good nutrition.   Rest as needed.   Return to work when your temperature has returned to normal or as your health care provider advises. You may need to stay home longer to avoid infecting others. You can also use a face mask and careful hand washing to prevent spread of the virus.  Increase the usage of your inhaler if you have asthma.   Do not use any tobacco products, including cigarettes, chewing tobacco, or electronic cigarettes. If you need help quitting, ask your health care provider. PREVENTION  The best way to protect yourself from getting a cold is to practice good hygiene.   Avoid oral or hand contact with people with cold   symptoms.   Wash your hands often if contact occurs.  There is no clear evidence that vitamin C, vitamin E, echinacea, or exercise reduces the chance of developing a cold. However, it is always recommended to get plenty of rest, exercise, and practice good nutrition.  SEEK MEDICAL CARE IF:   You are getting worse rather than better.   Your symptoms are not controlled by medicine.   You have chills.  You have worsening shortness of breath.  You have brown or red mucus.  You have yellow or brown nasal  discharge.  You have pain in your face, especially when you bend forward.  You have a fever.  You have swollen neck glands.  You have pain while swallowing.  You have white areas in the back of your throat. SEEK IMMEDIATE MEDICAL CARE IF:   You have severe or persistent:  Headache.  Ear pain.  Sinus pain.  Chest pain.  You have chronic lung disease and any of the following:  Wheezing.  Prolonged cough.  Coughing up blood.  A change in your usual mucus.  You have a stiff neck.  You have changes in your:  Vision.  Hearing.  Thinking.  Mood. MAKE SURE YOU:   Understand these instructions.  Will watch your condition.  Will get help right away if you are not doing well or get worse.   This information is not intended to replace advice given to you by your health care provider. Make sure you discuss any questions you have with your health care provider.   Document Released: 08/18/2000 Document Revised: 07/09/2014 Document Reviewed: 05/30/2013 Elsevier Interactive Patient Education 2016 Elsevier Inc.  

## 2015-04-25 NOTE — Progress Notes (Signed)
Date:  04/25/2015   Name:  Allison Snyder   DOB:  09/26/1929   MRN:  [id number]   Chief Complaint: URI URI  This is a new problem. The current episode started yesterday. The problem has been unchanged. There has been no fever. Associated symptoms include congestion, coughing, headaches, rhinorrhea and wheezing. Pertinent negatives include no plugged ear sensation, sinus pain, sore throat or vomiting. She has tried acetaminophen and inhaler use for the symptoms. The treatment provided mild relief.  She had her flu vaccine and has not been around anyone with the flu.  Review of Systems  HENT: Positive for congestion and rhinorrhea. Negative for sore throat.   Respiratory: Positive for cough and wheezing.   Gastrointestinal: Negative for vomiting.  Neurological: Positive for headaches.    Patient Active Problem List   Diagnosis Date Noted  . Chronic diastolic heart failure ([place]) 04/07/2015  . Essential hypertension 04/07/2015  . Varicose veins of both lower extremities 04/07/2015  . Breast mass, left 12/11/2014  . Bilateral renal cysts 09/23/2014  . Asthma, mild persistent 07/27/2014  . Aortic atherosclerosis ([place]) 07/27/2014  . Neoplasm of uncertain behavior of skin 07/27/2014  . Impaired renal function 07/27/2014  . Urinary system disease 07/27/2014    Prior to Admission medications   Medication Sig Start Date End Date Taking? Authorizing Provider  b complex vitamins capsule Take 1 capsule by mouth daily.   Yes Historical Provider, MD  budesonide (PULMICORT) 0.5 MG/2ML nebulizer solution Take 2 mLs (0.5 mg total) by nebulization 2 (two) times daily. 12/23/14  Yes Glean Hess, MD  cholecalciferol (VITAMIN D) 1000 UNITS tablet Take 5,000 Units by mouth daily.   Yes Historical Provider, MD  Multiple Minerals-Vitamins (CALCIUM CITRATE PLUS/MAGNESIUM) TABS Take 2 tablets by mouth daily.   Yes Historical Provider, MD  Omega-3 Fatty Acids (FISH OIL) 1000 MG CAPS Take 2,000  mg by mouth daily. Triple strength fish oil   Yes Historical Provider, MD  Pantothenic Acid 250 MG CAPS Take 250 mg by mouth daily.   Yes Historical Provider, MD  pyridoxine (B-6) 100 MG tablet Take 100 mg by mouth daily.   Yes Historical Provider, MD  Respiratory Therapy Supplies (FULL KIT NEBULIZER SET) MISC 1 each by Does not apply route 2 (two) times daily. 12/23/14  Yes Glean Hess, MD  Sennosides-Docusate Sodium (EX-LAX GENTLE STRENGTH PO) Take 6 capsules by mouth daily.   Yes Historical Provider, MD  vitamin A 10000 UNIT capsule Take 10,000 Units by mouth daily. Reported on 04/07/2015   Yes Historical Provider, MD  vitamin C (ASCORBIC ACID) 500 MG tablet Take 500 mg by mouth daily.   Yes Historical Provider, MD  vitamin E 400 UNIT capsule Take 1,200 Units by mouth daily.   Yes Historical Provider, MD    Allergies  Allergen Reactions  . Levaquin [Levofloxacin] Shortness Of Breath  . Prednisone Shortness Of Breath  . Albuterol Anxiety    Past Surgical History  Procedure Laterality Date  . Abdominal hysterectomy    . Tubal ligation    . Appendectomy      Social History  Substance Use Topics  . Smoking status: Never Smoker   . Smokeless tobacco: Never Used  . Alcohol Use: No     Medication list has been reviewed and updated.   Physical Exam  Constitutional: She appears well-developed and well-nourished. She has a sickly appearance.  HENT:  Right Ear: Tympanic membrane and ear canal normal.  Left Ear: Tympanic  membrane and ear canal normal.  Nose: Right sinus exhibits no maxillary sinus tenderness and no frontal sinus tenderness. Left sinus exhibits no maxillary sinus tenderness and no frontal sinus tenderness.  Mouth/Throat: No posterior oropharyngeal edema or posterior oropharyngeal erythema.  Cardiovascular: Normal rate, regular rhythm and normal heart sounds.   Pulmonary/Chest: Effort normal. She has decreased breath sounds. She has no wheezes. She has no rales.    Psychiatric: She has a normal mood and affect.    BP 118/70 mmHg  Pulse 66  Temp(Src) 98.2 F (36.8 C) (Oral)  Ht _0  (1.651 m)  Wt 162 lb (73.483 kg)  BMI 26.96 kg/m2  SpO2 98%  Assessment and Plan: 1. Viral URI Flu swab negative Recommend rest, fluids and continued nebulizer - POCT Influenza A/B   Halina Maidens, MD [facility]  04/25/2015

## 2015-04-25 NOTE — Progress Notes (Signed)
    Date:  04/25/2015   Name:  Allison Snyder   DOB:  23-May-1929   MRN:  [id number]   Chief Complaint: URI URI  This is a new problem. The current episode started today. The problem has been unchanged. There has been no fever. Associated symptoms include congestion, headaches and swollen glands. Pertinent negatives include no diarrhea, dysuria or ear pain. She has tried acetaminophen for the symptoms. The treatment provided mild relief.      Review of Systems  HENT: Positive for congestion. Negative for ear pain.   Gastrointestinal: Negative for diarrhea.  Genitourinary: Negative for dysuria.  Neurological: Positive for headaches.    Patient Active Problem List   Diagnosis Date Noted  . Chronic diastolic heart failure ([place]) 04/07/2015  . Essential hypertension 04/07/2015  . Varicose veins of both lower extremities 04/07/2015  . Breast mass, left 12/11/2014  . Bilateral renal cysts 09/23/2014  . Asthma, mild persistent 07/27/2014  . Aortic atherosclerosis ([place]) 07/27/2014  . Neoplasm of uncertain behavior of skin 07/27/2014  . Impaired renal function 07/27/2014  . Urinary system disease 07/27/2014    Prior to Admission medications   Medication Sig Start Date End Date Taking? Authorizing Provider  b complex vitamins capsule Take 1 capsule by mouth daily.   Yes Historical Provider, MD  budesonide (PULMICORT) 0.5 MG/2ML nebulizer solution Take 2 mLs (0.5 mg total) by nebulization 2 (two) times daily. 12/23/14  Yes Glean Hess, MD  cholecalciferol (VITAMIN D) 1000 UNITS tablet Take 5,000 Units by mouth daily.   Yes Historical Provider, MD  Multiple Minerals-Vitamins (CALCIUM CITRATE PLUS/MAGNESIUM) TABS Take 2 tablets by mouth daily.   Yes Historical Provider, MD  Omega-3 Fatty Acids (FISH OIL) 1000 MG CAPS Take 2,000 mg by mouth daily. Triple strength fish oil   Yes Historical Provider, MD  Pantothenic Acid 250 MG CAPS Take 250 mg by mouth daily.   Yes Historical  Provider, MD  pyridoxine (B-6) 100 MG tablet Take 100 mg by mouth daily.   Yes Historical Provider, MD  Respiratory Therapy Supplies (FULL KIT NEBULIZER SET) MISC 1 each by Does not apply route 2 (two) times daily. 12/23/14  Yes Glean Hess, MD  Sennosides-Docusate Sodium (EX-LAX GENTLE STRENGTH PO) Take 6 capsules by mouth daily.   Yes Historical Provider, MD  vitamin A 10000 UNIT capsule Take 10,000 Units by mouth daily. Reported on 04/07/2015   Yes Historical Provider, MD  vitamin C (ASCORBIC ACID) 500 MG tablet Take 500 mg by mouth daily.   Yes Historical Provider, MD  vitamin E 400 UNIT capsule Take 1,200 Units by mouth daily.   Yes Historical Provider, MD    Allergies  Allergen Reactions  . Levaquin [Levofloxacin] Shortness Of Breath  . Prednisone Shortness Of Breath  . Albuterol Anxiety    Past Surgical History  Procedure Laterality Date  . Abdominal hysterectomy    . Tubal ligation    . Appendectomy      Social History  Substance Use Topics  . Smoking status: Never Smoker   . Smokeless tobacco: Never Used  . Alcohol Use: No     Medication list has been reviewed and updated.   Physical Exam  BP 118/70 mmHg  Pulse 66  Temp(Src) 98.2 F (36.8 C) (Oral)  Ht _0  (1.651 m)  Wt 162 lb (73.483 kg)  BMI 26.96 kg/m2  SpO2 98%  Assessment and Plan:

## 2015-04-28 ENCOUNTER — Ambulatory Visit: Payer: Self-pay | Admitting: *Deleted

## 2015-04-28 ENCOUNTER — Encounter: Payer: Self-pay | Admitting: *Deleted

## 2015-04-28 ENCOUNTER — Other Ambulatory Visit: Payer: Self-pay | Admitting: *Deleted

## 2015-04-28 NOTE — Patient Outreach (Signed)
[facility]) Care Management  04/28/2015  Allison Snyder [facility] 29-Mar-1929 [id number]   RN Health Coach telephone call to patient.  Hipaa compliance verified. Patient was trying to take care of grandchildren and then go to the pharmacy. RN agreed to call patient back at 3pm today. Johny Shock, BSN, RN [facility] Care Management RN Health Coach Phone: [place] complies with applicable Federal civil rights laws and does not discriminate on the basis of race, color, national origin, age, disability, or sex. Espaol (Spanish)  [place] cumple con las leyes federales de derechos civiles aplicables y no discrimina por motivos de raza, color, nacionalidad, edad, discapacidad o sexo.    Ti[place] Vi[place]t ([place])  [place] tun th? lu?t dn quy?n hi?n hnh c?a Lin bang v khng phn bi?t ?i x? d?a trn ch?ng t?c, mu da, ngu?n g?c qu?c gia, ? tu?i, khuy?t t?t, ho?c gi?i tnh.    (Arabic)    [place]                      .

## 2015-04-28 NOTE — Patient Outreach (Signed)
[facility]) Care Management  [name]  04/28/2015   Allison Snyder [facility] April 01, 1929 [id number]  Subjective: RN Health Coach telephone call to patient.  Hipaa compliance verified. Patient stated she is not having any swelling in her legs. Per patient no shortness of breath when walking.Per patient she does not have any weight gain. Her weight is 159.0 today.  Patient did state that she sometimes feel dizzy when she first stands up. Per patient this is due to her vertigo. Per patient she did not check her blood pressure today that the Dr had checked it Friday and that it was all right. Patient is taking multiple vitamins on a daily basis. Patient discussed the bedbugs and the natural remedy she is treating them with from bed bath and beyond. Patient is very talkative and stated she was lonely that the community nurse was not coming out. Per patient she didn't have very many visitors. Patient did not remember the signs and symptoms of CHF. Patient didn't think she had CHF since she is not having any symptoms. Explained to patient that I was going to help her to understand the signs and symptoms  And things to do to prevent her from going into CHF.   Objective:   Current Medications:  Current Outpatient Prescriptions  Medication Sig Dispense Refill  . b complex vitamins capsule Take 1 capsule by mouth daily.    . budesonide (PULMICORT) 0.5 MG/2ML nebulizer solution Take 2 mLs (0.5 mg total) by nebulization 2 (two) times daily. 240 mL 5  . cholecalciferol (VITAMIN D) 1000 UNITS tablet Take 5,000 Units by mouth daily.    . Multiple Minerals-Vitamins (CALCIUM CITRATE PLUS/MAGNESIUM) TABS Take 2 tablets by mouth daily.    . Omega-3 Fatty Acids (FISH OIL) 1000 MG CAPS Take 2,000 mg by mouth daily. Triple strength fish oil    . Pantothenic Acid 250 MG CAPS Take 250 mg by mouth daily.    Marland Kitchen pyridoxine (B-6) 100 MG tablet Take 100 mg by mouth daily.    Marland Kitchen Respiratory Therapy  Supplies (FULL KIT NEBULIZER SET) MISC 1 each by Does not apply route 2 (two) times daily. 1 each 0  . Sennosides-Docusate Sodium (EX-LAX GENTLE STRENGTH PO) Take 6 capsules by mouth daily.    . vitamin A 10000 UNIT capsule Take 10,000 Units by mouth daily. Reported on 04/07/2015    . vitamin C (ASCORBIC ACID) 500 MG tablet Take 500 mg by mouth daily.    . vitamin E 400 UNIT capsule Take 1,200 Units by mouth daily.     No current facility-administered medications for this visit.    Functional Status:  In your present state of health, do you have any difficulty performing the following activities: 04/28/2015 04/07/2015  Hearing? Allison Snyder  Vision? Y Y  Difficulty concentrating or making decisions? Allison Snyder  Walking or climbing stairs? Y Y  Dressing or bathing? - N  Doing errands, shopping? N N  Preparing Food and eating ? - -  Using the Toilet? - -  In the past six months, have you accidently leaked urine? - -  Do you have problems with loss of bowel control? - -  Managing your Medications? - -  Managing your Finances? - -  Housekeeping or managing your Housekeeping? - -    Fall/Depression Screening: PHQ 2/9 Scores 04/28/2015 04/07/2015 02/20/2015 09/24/2014  PHQ - 2 Score 1 1 0 0    Assessment:  Knowledge deficit in self management of CHF Memory  Impairment Patient would benefit from [name] telephonic outreach for education and support for CHFself management [name] CM Care Plan Problem One        Most Recent Value   Care Plan Problem One  knoeledge deficit in self management of congestive heart failure   Role Documenting the Problem One  [name] for Problem One  Active   [name] Long Term Goal (31-90 days)  Patient will understand the signs and symptoms of congestive heart failure within the next 90 days   [name] Long Term Goal Start Date  04/28/15   Interventions for Problem One [name] discussed the signs and symptoms of congestive heart failure with  teachback. RN will send patient EMMI information on CHF. RN will send patient a magnet for refrigerator as a reminder. RN will follow up with patient within a month   [name] CM Short Term Goal #2 (0-30 days)  Patient will be able to verbalize the reason for checking weight each day   [name] CM Short Term Goal #2 Start Date  04/28/15   Interventions for Short Term Goal #2  RN will send patient a new 2017 calendar book to document weight. RN eill send patient EMMI information on weighing daily. RN will follow up with discussion       Plan:  RN Health Coach will provide ongoing education for patient on CHF through phone calls and sending printed information to patient for further discussion RN will send a large CHF magnet with zones and action plan for refrigerator RN will send EMMI information on what is CHF RN will send EMMI information on  Keeping Track of your weight each day RN will send EMMI information on  When to call your Dr or 911 RN will follow up outreach with in a month. RN will send patient a 2017 Calendar book  Johny Shock, BSN, [place] Management RN Health Coach Phone: [place] complies with applicable Federal civil rights laws and does not discriminate on the basis of race, color, national origin, age, disability, or sex. Espaol (Spanish)  [place] cumple con las leyes federales de derechos civiles aplicables y no discrimina por motivos de raza, color, nacionalidad, edad, discapacidad o sexo.    [place]t ([place])  [place] tun th? lu?t dn quy?n hi?n hnh c?a Lin bang v khng phn bi?t ?i x? d?a trn ch?ng t?c, mu da, ngu?n g?c qu?c gia, ? tu?i, khuy?t t?t, ho?c gi?i tnh.    (Arabic)    [place]                      .

## 2015-05-06 ENCOUNTER — Telehealth: Payer: Self-pay | Admitting: Family

## 2015-05-06 ENCOUNTER — Ambulatory Visit: Admitting: Family

## 2015-05-06 NOTE — Telephone Encounter (Signed)
Patient did not show for her appointment at the [facility] on 05/06/15. Will attempt to follow.

## 2015-05-09 ENCOUNTER — Encounter: Payer: Self-pay | Admitting: Emergency Medicine

## 2015-05-09 ENCOUNTER — Ambulatory Visit: Payer: Medicare Other

## 2015-05-09 ENCOUNTER — Ambulatory Visit
Admission: EM | Admit: 2015-05-09 | Discharge: 2015-05-09 | Disposition: A | Discharge status: Home / Self Care | Payer: Medicare Other | Attending: Family Medicine | Admitting: Family Medicine

## 2015-05-09 DIAGNOSIS — J40 Bronchitis, not specified as acute or chronic: Secondary | ICD-10-CM | POA: Diagnosis not present

## 2015-05-09 DIAGNOSIS — I517 Cardiomegaly: Secondary | ICD-10-CM | POA: Diagnosis not present

## 2015-05-09 DIAGNOSIS — R05 Cough: Secondary | ICD-10-CM | POA: Diagnosis not present

## 2015-05-09 DIAGNOSIS — J209 Acute bronchitis, unspecified: Secondary | ICD-10-CM

## 2015-05-09 DIAGNOSIS — R918 Other nonspecific abnormal finding of lung field: Secondary | ICD-10-CM | POA: Diagnosis not present

## 2015-05-09 MED ORDER — AZITHROMYCIN 250 MG PO TABS: ORAL_TABLET | ORAL | Status: DC

## 2015-05-09 NOTE — ED Notes (Signed)
Pt presents with cough for three weeks. Pt states she it usually turns into bronchitis.

## 2015-05-09 NOTE — ED Provider Notes (Signed)
CSN: [id number]     Arrival date & time 05/09/15  1529 History   First MD Initiated Contact with Patient 05/09/15 1605     Chief Complaint  Patient presents with  . Cough   (Consider location/radiation/quality/duration/timing/severity/associated sxs/prior Treatment) HPI This 82 year old female who presents with a cough and a cold that was not side. She's had it now for 3 weeks. She states that she saw Dr. Army Melia who tested her for flu but did not give any medications. She has a history of asthma. States that she has not been running a fever or had any chills. The cough is productive but she tends to swallow this. She is afebrile today 98.2 pulse 76 respirations 18 blood pressure 164/77 O2 sats on room air 95%. Past Medical History  Diagnosis Date  . Asthma   . History of appendectomy   . H/O tubal ligation   . Gallbladder & bile duct stone with obstruction     issues off and on  . Hypoglycemic syndrome   . CHF (congestive heart failure) [facility])    Past Surgical History  Procedure Laterality Date  . Abdominal hysterectomy    . Tubal ligation    . Appendectomy     Family History  Problem Relation Age of Onset  . Colon cancer Mother   . Diabetes Sister    Social History  Substance Use Topics  . Smoking status: Never Smoker   . Smokeless tobacco: Never Used  . Alcohol Use: No   OB History    No data available     Review of Systems  Constitutional: Negative for fever, chills, activity change, appetite change and fatigue.  Respiratory: Positive for cough, shortness of breath and wheezing.   All other systems reviewed and are negative.   Allergies  Levaquin; Prednisone; and Albuterol  Home Medications   Prior to Admission medications   Medication Sig Start Date End Date Taking? Authorizing Provider  azithromycin (ZITHROMAX Z-PAK) 250 MG tablet Use as per package instructions 05/09/15   Lorin Picket, PA-C  b complex vitamins capsule Take 1 capsule by mouth daily.     Historical Provider, MD  budesonide (PULMICORT) 0.5 MG/2ML nebulizer solution Take 2 mLs (0.5 mg total) by nebulization 2 (two) times daily. 12/23/14   Glean Hess, MD  cholecalciferol (VITAMIN D) 1000 UNITS tablet Take 5,000 Units by mouth daily.    Historical Provider, MD  Multiple Minerals-Vitamins (CALCIUM CITRATE PLUS/MAGNESIUM) TABS Take 2 tablets by mouth daily.    Historical Provider, MD  Omega-3 Fatty Acids (FISH OIL) 1000 MG CAPS Take 2,000 mg by mouth daily. Triple strength fish oil    Historical Provider, MD  Pantothenic Acid 250 MG CAPS Take 250 mg by mouth daily.    Historical Provider, MD  pyridoxine (B-6) 100 MG tablet Take 100 mg by mouth daily.    Historical Provider, MD  Respiratory Therapy Supplies (FULL KIT NEBULIZER SET) MISC 1 each by Does not apply route 2 (two) times daily. 12/23/14   Glean Hess, MD  Sennosides-Docusate Sodium (EX-LAX GENTLE STRENGTH PO) Take 6 capsules by mouth daily.    Historical Provider, MD  vitamin A 10000 UNIT capsule Take 10,000 Units by mouth daily. Reported on 04/07/2015    Historical Provider, MD  vitamin C (ASCORBIC ACID) 500 MG tablet Take 500 mg by mouth daily.    Historical Provider, MD  vitamin E 400 UNIT capsule Take 1,200 Units by mouth daily.    Historical Provider, MD  Meds Ordered and Administered this Visit  Medications - No data to display  BP 164/77 mmHg  Pulse 76  Temp(Src) 98.2 F (36.8 C) (Oral)  Resp 18  Ht _0  (1.651 m)  Wt 158 lb (71.668 kg)  BMI 26.29 kg/m2  SpO2 95% No data found.   Physical Exam  Constitutional: She is oriented to person, place, and time. She appears well-developed and well-nourished. No distress.  HENT:  Head: Normocephalic and atraumatic.  Right Ear: External ear normal.  Left Ear: External ear normal.  Nose: Nose normal.  Mouth/Throat: Oropharynx is clear and moist. No oropharyngeal exudate.  Eyes: Conjunctivae are normal. Pupils are equal, round, and reactive to light.   Neck: Normal range of motion. Neck supple.  Pulmonary/Chest: Effort normal. No respiratory distress. She has wheezes. She has rales.  Musculoskeletal: Normal range of motion. She exhibits no edema or tenderness.  Neurological: She is alert and oriented to person, place, and time.  Skin: Skin is warm and dry. She is not diaphoretic.  Psychiatric: She has a normal mood and affect. Her behavior is normal. Judgment and thought content normal.  Nursing note and vitals reviewed.   ED Course  Procedures (including critical care time)  Labs Review Labs Reviewed - No data to display  Imaging Review Dg Chest 2 View  05/09/2015  CLINICAL DATA:  Cough for 3 weeks EXAM: CHEST  2 VIEW COMPARISON:  02/20/2015 FINDINGS: Cardiomegaly again noted. No acute infiltrate or pleural effusion. No pulmonary edema. Stable left basilar scarring. Osteopenia and mild degenerative changes thoracic spine. IMPRESSION: No active disease. Cardiomegaly again noted. Stable left basilar scarring. Electronically Signed   By: Lahoma Crocker M.D.   On: 05/09/2015 17:13     Visual Acuity Review  Right Eye Distance:   Left Eye Distance:   Bilateral Distance:    Right Eye Near:   Left Eye Near:    Bilateral Near:         MDM   1. Bronchitis with bronchospasm    New Prescriptions   AZITHROMYCIN (ZITHROMAX Z-PAK) 250 MG TABLET    Use as per package instructions  Plan: 1. Test/x-ray results and diagnosis reviewed with patient 2. rx as per orders; risks, benefits, potential side effects reviewed with patient 3. Recommend supportive treatment with inhaler prescribed by Dr. Army Melia. She can take over-the-counter cough syrup of Delsym 12 hour. Have asked her to increase her fluids as tolerated but not to drink too much due to her heart failure. He should follow-up with Dr. Army Melia if she has any change or nonimprovement  4. F/u prn if symptoms worsen or don't improve      Lorin Picket, PA-C 05/09/15 1729

## 2015-05-09 NOTE — Discharge Instructions (Signed)
How to Use an Inhaler Proper inhaler technique is very important. Good technique ensures that the medicine reaches the lungs. Poor technique results in depositing the medicine on the tongue and back of the throat rather than in the airways. If you do not use the inhaler with good technique, the medicine will not help you. STEPS TO FOLLOW IF USING AN INHALER WITHOUT AN EXTENSION TUBE  Remove the cap from the inhaler.  If you are using the inhaler for the first time, you will need to prime it. Shake the inhaler for 5 seconds and release four puffs into the air, away from your face. Ask your health care provider or pharmacist if you have questions about priming your inhaler.  Shake the inhaler for 5 seconds before each breath in (inhalation).  Position the inhaler so that the top of the canister faces up.  Put your index finger on the top of the medicine canister. Your thumb supports the bottom of the inhaler.  Open your mouth.  Either place the inhaler between your teeth and place your lips tightly around the mouthpiece, or hold the inhaler 1-2 inches away from your open mouth. If you are unsure of which technique to use, ask your health care provider.  Breathe out (exhale) normally and as completely as possible.  Press the canister down with your index finger to release the medicine.  At the same time as the canister is pressed, inhale deeply and slowly until your lungs are completely filled. This should take 4-6 seconds. Keep your tongue down.  Hold the medicine in your lungs for 5-10 seconds (10 seconds is best). This helps the medicine get into the small airways of your lungs.  Breathe out slowly, through pursed lips. Whistling is an example of pursed lips.  Wait at least 15-30 seconds between puffs. Continue with the above steps until you have taken the number of puffs your health care provider has ordered. Do not use the inhaler more than your health care provider tells  you.  Replace the cap on the inhaler.  Follow the directions from your health care provider or the inhaler insert for cleaning the inhaler. STEPS TO FOLLOW IF USING AN INHALER WITH AN EXTENSION (SPACER)  Remove the cap from the inhaler.  If you are using the inhaler for the first time, you will need to prime it. Shake the inhaler for 5 seconds and release four puffs into the air, away from your face. Ask your health care provider or pharmacist if you have questions about priming your inhaler.  Shake the inhaler for 5 seconds before each breath in (inhalation).  Place the open end of the spacer onto the mouthpiece of the inhaler.  Position the inhaler so that the top of the canister faces up and the spacer mouthpiece faces you.  Put your index finger on the top of the medicine canister. Your thumb supports the bottom of the inhaler and the spacer.  Breathe out (exhale) normally and as completely as possible.  Immediately after exhaling, place the spacer between your teeth and into your mouth. Close your lips tightly around the spacer.  Press the canister down with your index finger to release the medicine.  At the same time as the canister is pressed, inhale deeply and slowly until your lungs are completely filled. This should take 4-6 seconds. Keep your tongue down and out of the way.  Hold the medicine in your lungs for 5-10 seconds (10 seconds is best). This helps the  medicine get into the small airways of your lungs. Exhale.  Repeat inhaling deeply through the spacer mouthpiece. Again hold that breath for up to 10 seconds (10 seconds is best). Exhale slowly. If it is difficult to take this second deep breath through the spacer, breathe normally several times through the spacer. Remove the spacer from your mouth.  Wait at least 15-30 seconds between puffs. Continue with the above steps until you have taken the number of puffs your health care provider has ordered. Do not use the  inhaler more than your health care provider tells you.  Remove the spacer from the inhaler, and place the cap on the inhaler.  Follow the directions from your health care provider or the inhaler insert for cleaning the inhaler and spacer. If you are using different kinds of inhalers, use your quick relief medicine to open the airways 10-15 minutes before using a steroid if instructed to do so by your health care provider. If you are unsure which inhalers to use and the order of using them, ask your health care provider, nurse, or respiratory therapist. If you are using a steroid inhaler, always rinse your mouth with water after your last puff, then gargle and spit out the water. Do not swallow the water. AVOID:  Inhaling before or after starting the spray of medicine. It takes practice to coordinate your breathing with triggering the spray.  Inhaling through the nose (rather than the mouth) when triggering the spray. HOW TO DETERMINE IF YOUR INHALER IS FULL OR NEARLY EMPTY You cannot know when an inhaler is empty by shaking it. A few inhalers are now being made with dose counters. Ask your health care provider for a prescription that has a dose counter if you feel you need that extra help. If your inhaler does not have a counter, ask your health care provider to help you determine the date you need to refill your inhaler. Write the refill date on a calendar or your inhaler canister. Refill your inhaler 7-10 days before it runs out. Be sure to keep an adequate supply of medicine. This includes making sure it is not expired, and that you have a spare inhaler.  SEEK MEDICAL CARE IF:   Your symptoms are only partially relieved with your inhaler.  You are having trouble using your inhaler.  You have some increase in phlegm. SEEK IMMEDIATE MEDICAL CARE IF:   You feel little or no relief with your inhalers. You are still wheezing and are feeling shortness of breath or tightness in your chest or  both.  You have dizziness, headaches, or a fast heart rate.  You have chills, fever, or night sweats.  You have a noticeable increase in phlegm production, or there is blood in the phlegm. MAKE SURE YOU:   Understand these instructions.  Will watch your condition.  Will get help right away if you are not doing well or get worse.   This information is not intended to replace advice given to you by your health care provider. Make sure you discuss any questions you have with your health care provider.   Document Released: 02/20/2000 Document Revised: 12/13/2012 Document Reviewed: 09/21/2012 Elsevier Interactive Patient Education 2016 Elsevier Inc.  Upper Respiratory Infection, Adult Most upper respiratory infections (URIs) are a viral infection of the air passages leading to the lungs. A URI affects the nose, throat, and upper air passages. The most common type of URI is nasopharyngitis and is typically referred to as "the common cold."  URIs run their course and usually go away on their own. Most of the time, a URI does not require medical attention, but sometimes a bacterial infection in the upper airways can follow a viral infection. This is called a secondary infection. Sinus and middle ear infections are common types of secondary upper respiratory infections. Bacterial pneumonia can also complicate a URI. A URI can worsen asthma and chronic obstructive pulmonary disease (COPD). Sometimes, these complications can require emergency medical care and may be life threatening.  CAUSES Almost all URIs are caused by viruses. A virus is a type of germ and can spread from one person to another.  RISKS FACTORS You may be at risk for a URI if:   You smoke.   You have chronic heart or lung disease.  You have a weakened defense (immune) system.   You are very young or very old.   You have nasal allergies or asthma.  You work in crowded or poorly ventilated areas.  You work in health  care facilities or schools. SIGNS AND SYMPTOMS  Symptoms typically develop 2-3 days after you come in contact with a cold virus. Most viral URIs last 7-10 days. However, viral URIs from the influenza virus (flu virus) can last 14-18 days and are typically more severe. Symptoms may include:   Runny or stuffy (congested) nose.   Sneezing.   Cough.   Sore throat.   Headache.   Fatigue.   Fever.   Loss of appetite.   Pain in your forehead, behind your eyes, and over your cheekbones (sinus pain).  Muscle aches.  DIAGNOSIS  Your health care provider may diagnose a URI by:  Physical exam.  Tests to check that your symptoms are not due to another condition such as:  Strep throat.  Sinusitis.  Pneumonia.  Asthma. TREATMENT  A URI goes away on its own with time. It cannot be cured with medicines, but medicines may be prescribed or recommended to relieve symptoms. Medicines may help:  Reduce your fever.  Reduce your cough.  Relieve nasal congestion. HOME CARE INSTRUCTIONS   Take medicines only as directed by your health care provider.   Gargle warm saltwater or take cough drops to comfort your throat as directed by your health care provider.  Use a warm mist humidifier or inhale steam from a shower to increase air moisture. This may make it easier to breathe.  Drink enough fluid to keep your urine clear or pale yellow.   Eat soups and other clear broths and maintain good nutrition.   Rest as needed.   Return to work when your temperature has returned to normal or as your health care provider advises. You may need to stay home longer to avoid infecting others. You can also use a face mask and careful hand washing to prevent spread of the virus.  Increase the usage of your inhaler if you have asthma.   Do not use any tobacco products, including cigarettes, chewing tobacco, or electronic cigarettes. If you need help quitting, ask your health care  provider. PREVENTION  The best way to protect yourself from getting a cold is to practice good hygiene.   Avoid oral or hand contact with people with cold symptoms.   Wash your hands often if contact occurs.  There is no clear evidence that vitamin C, vitamin E, echinacea, or exercise reduces the chance of developing a cold. However, it is always recommended to get plenty of rest, exercise, and practice good nutrition.  SEEK MEDICAL CARE IF:  °· You are getting worse rather than better.   °· Your symptoms are not controlled by medicine.   °· You have chills. °· You have worsening shortness of breath. °· You have brown or red mucus. °· You have yellow or brown nasal discharge. °· You have pain in your face, especially when you bend forward. °· You have a fever. °· You have swollen neck glands. °· You have pain while swallowing. °· You have white areas in the back of your throat. °SEEK IMMEDIATE MEDICAL CARE IF:  °· You have severe or persistent: °¨ Headache. °¨ Ear pain. °¨ Sinus pain. °¨ Chest pain. °· You have chronic lung disease and any of the following: °¨ Wheezing. °¨ Prolonged cough. °¨ Coughing up blood. °¨ A change in your usual mucus. °· You have a stiff neck. °· You have changes in your: °¨ Vision. °¨ Hearing. °¨ Thinking. °¨ Mood. °MAKE SURE YOU:  °· Understand these instructions. °· Will watch your condition. °· Will get help right away if you are not doing well or get worse. °  °This information is not intended to replace advice given to you by your health care provider. Make sure you discuss any questions you have with your health care provider. °  °Document Released: 08/18/2000 Document Revised: 07/09/2014 Document Reviewed: 05/30/2013 °Elsevier Interactive Patient Education ©2016 Elsevier Inc. ° °

## 2015-05-22 ENCOUNTER — Ambulatory Visit: Payer: Self-pay | Admitting: *Deleted

## 2015-05-22 ENCOUNTER — Other Ambulatory Visit: Payer: Self-pay | Admitting: *Deleted

## 2015-05-22 NOTE — Patient Outreach (Signed)
[place] [facility]) Care Management  05/22/2015  Allison Snyder [facility] June 28, 1929 [id number]   RN Health Coach telephone call to patient.  Hipaa compliance verified. Per patient stated she does not have any congestive heart failure. Who diagnosed this? I weigh the same as I did before. I don't have any swelling in my ankles. I explained that I had talked to her before and that Janci Minor had also been out to see her. She stated I don't have any more bedbugs and you can come see me face to face. I explained that I am the health coach that had called her and sent her information.   Patient stated she is in the car with her daughter not buckled up. I explained I will call her back later.   Johny Shock, BSN, RN [facility] Care Management RN Health Coach Phone: [facility] complies with applicable Federal civil rights laws and does not discriminate on the basis of race, color, national origin, age, disability, or sex. Espaol (Spanish)  [facility] cumple con las leyes federales de derechos civiles aplicables y no discrimina por motivos de raza, color, nacionalidad, edad, discapacidad o sexo.    [place] ([place]  [place] tun th? lu?t dn quy?n hi?n hnh c?a [place] bang v khng phn bi?t ?i x? d?a trn ch?ng t?c, mu da, ngu?n g?c qu?c gia, ? tu?i, khuy?t t?t, ho?c gi?i tnh.    (Arabic)    [place]                      .

## 2015-07-14 ENCOUNTER — Other Ambulatory Visit: Payer: Self-pay | Admitting: *Deleted

## 2015-07-14 NOTE — Patient Outreach (Signed)
[place] [facility]) Care Management  07/14/2015  Allison Snyder [facility] 1929/06/14 [id number]   RN Health Coach telephone call to patient.  Hipaa compliance verified. Per patient she is in the grocery store shopping. RN Health Coach informed her that I would call back later.  [place] Care Management [phone]

## 2015-07-16 ENCOUNTER — Ambulatory Visit: Payer: Self-pay | Admitting: *Deleted

## 2015-08-21 ENCOUNTER — Ambulatory Visit: Payer: Medicare Other | Admitting: Internal Medicine

## 2015-08-22 ENCOUNTER — Ambulatory Visit: Payer: Medicare Other | Admitting: Internal Medicine

## 2015-08-28 ENCOUNTER — Emergency Department
Admission: EM | Admit: 2015-08-28 | Discharge: 2015-08-28 | Disposition: A | Discharge status: Home / Self Care | Payer: Medicare Other | Attending: Emergency Medicine | Admitting: Emergency Medicine

## 2015-08-28 ENCOUNTER — Emergency Department: Payer: Medicare Other

## 2015-08-28 DIAGNOSIS — I509 Heart failure, unspecified: Secondary | ICD-10-CM | POA: Diagnosis not present

## 2015-08-28 DIAGNOSIS — J45909 Unspecified asthma, uncomplicated: Secondary | ICD-10-CM | POA: Diagnosis not present

## 2015-08-28 DIAGNOSIS — X58XXXA Exposure to other specified factors, initial encounter: Secondary | ICD-10-CM | POA: Insufficient documentation

## 2015-08-28 DIAGNOSIS — Y999 Unspecified external cause status: Secondary | ICD-10-CM | POA: Diagnosis not present

## 2015-08-28 DIAGNOSIS — S63501A Unspecified sprain of right wrist, initial encounter: Secondary | ICD-10-CM | POA: Diagnosis not present

## 2015-08-28 DIAGNOSIS — Z79899 Other long term (current) drug therapy: Secondary | ICD-10-CM | POA: Insufficient documentation

## 2015-08-28 DIAGNOSIS — I110 Hypertensive heart disease with heart failure: Secondary | ICD-10-CM | POA: Insufficient documentation

## 2015-08-28 DIAGNOSIS — Y929 Unspecified place or not applicable: Secondary | ICD-10-CM | POA: Insufficient documentation

## 2015-08-28 DIAGNOSIS — I5032 Chronic diastolic (congestive) heart failure: Secondary | ICD-10-CM | POA: Diagnosis not present

## 2015-08-28 DIAGNOSIS — Y939 Activity, unspecified: Secondary | ICD-10-CM | POA: Insufficient documentation

## 2015-08-28 DIAGNOSIS — S6991XA Unspecified injury of right wrist, hand and finger(s), initial encounter: Secondary | ICD-10-CM | POA: Diagnosis present

## 2015-08-28 DIAGNOSIS — M7989 Other specified soft tissue disorders: Secondary | ICD-10-CM | POA: Diagnosis not present

## 2015-08-28 MED ORDER — TRAMADOL HCL 50 MG PO TABS: 50.0000 mg | ORAL_TABLET | Freq: Two times a day (BID) | ORAL | Status: DC | PRN

## 2015-08-28 NOTE — Discharge Instructions (Signed)
Were splint for 3-5 days as needed.

## 2015-08-28 NOTE — ED Provider Notes (Signed)
[facility] Emergency Department Provider Note   ____________________________________________  Time seen: Approximately 3:31 PM  I have reviewed the triage vital signs and the nursing notes.   HISTORY  Chief Complaint Hand Pain    HPI Allison Snyder is a 82 y.o. female patient complain of right wrist pain after securing large bottles of water. Patient also been interacting with  grandchildren by lifting.No other provocative incident for her complaint. Patient stated pain for 2 days. Patient states pain increase with flexion and extension. of the wrist. Patient is right-hand dominant. Patient rates the pain as a 5/10. No palliative measures for this complaint.   Past Medical History  Diagnosis Date  . Asthma   . History of appendectomy   . H/O tubal ligation   . Gallbladder & bile duct stone with obstruction     issues off and on  . Hypoglycemic syndrome   . CHF (congestive heart failure) [facility])     Patient Active Problem List   Diagnosis Date Noted  . Chronic diastolic heart failure ([place]) 04/07/2015  . Essential hypertension 04/07/2015  . Varicose veins of both lower extremities 04/07/2015  . Breast mass, left 12/11/2014  . Bilateral renal cysts 09/23/2014  . Asthma, mild persistent 07/27/2014  . Aortic atherosclerosis ([place]) 07/27/2014  . Neoplasm of uncertain behavior of skin 07/27/2014  . Impaired renal function 07/27/2014  . Urinary system disease 07/27/2014    Past Surgical History  Procedure Laterality Date  . Abdominal hysterectomy    . Tubal ligation    . Appendectomy      Current Outpatient Rx  Name  Route  Sig  Dispense  Refill  . azithromycin (ZITHROMAX Z-PAK) 250 MG tablet      Use as per package instructions   1 each   0   . b complex vitamins capsule   Oral   Take 1 capsule by mouth daily.         . budesonide (PULMICORT) 0.5 MG/2ML nebulizer solution   Nebulization   Take 2 mLs (0.5 mg total) by  nebulization 2 (two) times daily.   240 mL   5   . cholecalciferol (VITAMIN D) 1000 UNITS tablet   Oral   Take 5,000 Units by mouth daily.         . Multiple Minerals-Vitamins (CALCIUM CITRATE PLUS/MAGNESIUM) TABS   Oral   Take 2 tablets by mouth daily.         . Omega-3 Fatty Acids (FISH OIL) 1000 MG CAPS   Oral   Take 2,000 mg by mouth daily. Triple strength fish oil         . Pantothenic Acid 250 MG CAPS   Oral   Take 250 mg by mouth daily.         Marland Kitchen pyridoxine (B-6) 100 MG tablet   Oral   Take 100 mg by mouth daily.         Marland Kitchen Respiratory Therapy Supplies (FULL KIT NEBULIZER SET) MISC   Does not apply   1 each by Does not apply route 2 (two) times daily.   1 each   0   . Sennosides-Docusate Sodium (EX-LAX GENTLE STRENGTH PO)   Oral   Take 6 capsules by mouth daily.         . traMADol (ULTRAM) 50 MG tablet   Oral   Take 1 tablet (50 mg total) by mouth every 12 (twelve) hours as needed for moderate pain.   12 tablet  0   . vitamin A 10000 UNIT capsule   Oral   Take 10,000 Units by mouth daily. Reported on 04/07/2015         . vitamin C (ASCORBIC ACID) 500 MG tablet   Oral   Take 500 mg by mouth daily.         . vitamin E 400 UNIT capsule   Oral   Take 1,200 Units by mouth daily.           Allergies Levaquin; Prednisone; and Albuterol  Family History  Problem Relation Age of Onset  . Colon cancer Mother   . Diabetes Sister     Social History Social History  Substance Use Topics  . Smoking status: Never Smoker   . Smokeless tobacco: Never Used  . Alcohol Use: No    Review of Systems Constitutional: No fever/chills Eyes: No visual changes. ENT: No sore throat. Cardiovascular: Denies chest pain. Respiratory: Denies shortness of breath. Gastrointestinal: No abdominal pain.  No nausea, no vomiting.  No diarrhea.  No constipation. Genitourinary: Negative for dysuria. Musculoskeletal: Right wrist hand pain Skin: Negative for  rash. Neurological: Negative for headaches, focal weakness or numbness. 10-point ROS otherwise negative.  ____________________________________________   PHYSICAL EXAM:  VITAL SIGNS: ED Triage Vitals  Enc Vitals Group     BP 08/28/15 1521 142/65 mmHg     Pulse Rate 08/28/15 1521 73     Resp 08/28/15 1521 18     Temp 08/28/15 1521 98.8 F (37.1 C)     Temp Source 08/28/15 1521 Oral     SpO2 08/28/15 1521 97 %     Weight 08/28/15 1521 160 lb (72.576 kg)     Height --      Head Cir --      Peak Flow --      Pain Score 08/28/15 1521 5     Pain Loc --      Pain Edu? --      Excl. in [place]? --     Constitutional: Alert and oriented. Well appearing and in no acute distress. Eyes: Conjunctivae are normal. PERRL. EOMI. Head: Atraumatic. Nose: No congestion/rhinnorhea. Mouth/Throat: Mucous membranes are moist.  Oropharynx non-erythematous. Neck: No stridor.  No cervical spine tenderness to palpation. Hematological/Lymphatic/Immunilogical: No cervical lymphadenopathy. Cardiovascular: Normal rate, regular rhythm. Grossly normal heart sounds.  Good peripheral circulation. Respiratory: Normal respiratory effort.  No retractions. Lungs CTAB. Gastrointestinal: Soft and nontender. No distention. No abdominal bruits. No CVA tenderness. Musculoskeletal:No obvious deformity of the right wrist and hand. Mild edema to the wrist. Decreased range of motion with flexion and extension limited by complaining of pain. Tender palpation distal radius.  Neurologic:  Normal speech and language. No gross focal neurologic deficits are appreciated. No gait instability. Skin:  Skin is warm, dry and intact. No rash noted. Psychiatric: Mood and affect are normal. Speech and behavior are normal.  ____________________________________________   LABS (all labs ordered are listed, but only abnormal results are displayed)  Labs Reviewed - No data to  display ____________________________________________  EKG   ____________________________________________  RADIOLOGY  No acute findings on x-ray. Patient is moderate degenerative changes. ____________________________________________   PROCEDURES  Procedure(s) performed: None  Critical Care performed: No  ____________________________________________   INITIAL IMPRESSION / ASSESSMENT AND PLAN / ED COURSE  Pertinent labs & imaging results that were available during my care of the patient were reviewed by me and considered in my medical decision making (see chart for details).  Sprain right wrist.  Patient given discharge care instructions. Patient placed in a Velcro wrist splint. Patient advised to use over-the-counter Tylenol. Patient given a prescription for tramadol to use twice a day for acute pain. Patient advised follow-up family doctor one week if condition persists. ____________________________________________   FINAL CLINICAL IMPRESSION(S) / ED DIAGNOSES  Final diagnoses:  Sprain of right wrist, initial encounter      NEW MEDICATIONS STARTED DURING THIS VISIT:  New Prescriptions   TRAMADOL (ULTRAM) 50 MG TABLET    Take 1 tablet (50 mg total) by mouth every 12 (twelve) hours as needed for moderate pain.     Note:  This document was prepared using Dragon voice recognition software and may include unintentional dictation errors.    Sable Feil, PA-C 08/28/15 [place], MD 08/28/15 2018

## 2015-08-28 NOTE — ED Notes (Signed)
Pt arrives to ER via POV with family c/o right hand and wrist pain. Pt arrives with right hand ace bandaged. Pt denies injury. Pt family does report pt carrying in large packs of water bottles and being around young children. Pt alert and oriented X4, active, cooperative, pt in NAD. RR even and unlabored, color WNL.  No obvious deformity.

## 2015-09-08 ENCOUNTER — Encounter: Payer: Self-pay | Admitting: *Deleted

## 2015-09-08 ENCOUNTER — Other Ambulatory Visit: Payer: Self-pay | Admitting: *Deleted

## 2015-09-08 NOTE — Patient Outreach (Signed)
[place] [facility]) Care Management  09/08/2015  Eilzabeth Nowlen [facility] 07-23-1929 [id number]   RN Health Coach telephone call to patient.  Hipaa compliance verified. Per patient she doesn't have Congestive Failure. She doesn't remember any one telling her that. She weighed today and it was 159 pounds but she doesn't weigh every day. Per patient she doesn't have any swelling in her legs. Patient stated she doesn't know where I got  that she has congestive heart failure. Patient doesn't remember anything about the information sent in the past. She doesn't remember anything about a congestive heart failure zone magnet. The patient talked about her daughter came to get the bed bugs out of her house and repaint it. Then the daughter wanted to sell it. Then patient proceeded to tell about the son in law putting moth balls under the house for mice and the smell came thru her vents into her house making it hard to breathe. She left for a days and her grandson came and got most of the mothballs out so she could go back home.  When asked patient about the her seeing the cardiology nurse practitioner patient stated she sorta remember but she doesn't have  CHF and she wouldn't  take any fluid pills because they make you pee and you get weak.  Plan: Case closure due to member unable to progress through the care plan.   [place] Care Management [phone]

## 2015-10-13 ENCOUNTER — Ambulatory Visit (INDEPENDENT_AMBULATORY_CARE_PROVIDER_SITE_OTHER): Payer: Medicare Other | Admitting: Internal Medicine

## 2015-10-13 ENCOUNTER — Encounter: Payer: Self-pay | Admitting: Internal Medicine

## 2015-10-13 VITALS — BP 116/76 | HR 78 | Resp 16 | Ht 65.0 in | Wt 168.0 lb

## 2015-10-13 DIAGNOSIS — R0982 Postnasal drip: Secondary | ICD-10-CM | POA: Diagnosis not present

## 2015-10-13 DIAGNOSIS — I951 Orthostatic hypotension: Secondary | ICD-10-CM | POA: Diagnosis not present

## 2015-10-13 NOTE — Patient Instructions (Signed)
Increase fluid intake - water, tea, gatorade, etc.  Begin Claritin 10 mg once a day - try it for several weeks to see if it reduces your cough.

## 2015-10-13 NOTE — Progress Notes (Signed)
Date:  10/13/2015   Name:  Allison Snyder   DOB:  05/24/1929   MRN:  [id number]   Chief Complaint: Dizziness (BP dropping when she stands up and feels she is going to pass out x 1 month.) and Cough (2 month ) Dizziness  This is a chronic problem. The problem occurs 2 to 4 times per day. Associated symptoms include coughing. Pertinent negatives include no chest pain, fever, headaches, nausea or weakness. The symptoms are aggravated by standing. She has tried nothing for the symptoms.  Cough  This is a recurrent problem. The problem has been unchanged. The problem occurs every few hours. The cough is non-productive. Pertinent negatives include no chest pain, fever, headaches, shortness of breath or wheezing. The symptoms are aggravated by exercise. She has tried steroid inhaler for the symptoms. Her past medical history is significant for COPD.     Review of Systems  Constitutional: Negative for fever and unexpected weight change.  Respiratory: Positive for cough. Negative for shortness of breath and wheezing.   Cardiovascular: Negative for chest pain, palpitations and leg swelling.  Gastrointestinal: Negative for nausea.  Neurological: Positive for light-headedness. Negative for dizziness, tremors, syncope, speech difficulty, weakness and headaches.  Hematological: Negative for adenopathy.    Patient Active Problem List   Diagnosis Date Noted  . Chronic diastolic heart failure ([place]) 04/07/2015  . Essential hypertension 04/07/2015  . Varicose veins of both lower extremities 04/07/2015  . Breast mass, left 12/11/2014  . Bilateral renal cysts 09/23/2014  . Asthma, mild persistent 07/27/2014  . Aortic atherosclerosis ([place]) 07/27/2014  . Neoplasm of uncertain behavior of skin 07/27/2014  . Impaired renal function 07/27/2014  . Urinary system disease 07/27/2014    Prior to Admission medications   Medication Sig Start Date End Date Taking? Authorizing Provider  b complex vitamins  capsule Take 1 capsule by mouth daily.   Yes Historical Provider, MD  budesonide (PULMICORT) 0.5 MG/2ML nebulizer solution Take 2 mLs (0.5 mg total) by nebulization 2 (two) times daily. 12/23/14  Yes Glean Hess, MD  Cholecalciferol (VITAMIN D3) 1000 units CAPS Take by mouth.   Yes Historical Provider, MD  Multiple Minerals-Vitamins (CALCIUM CITRATE PLUS/MAGNESIUM) TABS Take 2 tablets by mouth daily. 1,000-500 mg   Yes Historical Provider, MD  Omega-3 Fatty Acids (FISH OIL) 1000 MG CAPS Take 2,000 mg by mouth daily. Triple strength fish oil   Yes Historical Provider, MD  Pyridoxine HCl (VITAMIN B6 PO) Take by mouth.   Yes Historical Provider, MD  Sennosides-Docusate Sodium (EX-LAX GENTLE STRENGTH PO) Take 6 capsules by mouth daily.   Yes Historical Provider, MD  vitamin A 10000 UNIT capsule Take 10,000 Units by mouth daily. Reported on 04/07/2015   Yes Historical Provider, MD  vitamin C (ASCORBIC ACID) 500 MG tablet Take 500 mg by mouth daily.   Yes Historical Provider, MD  vitamin E 400 UNIT capsule Take 1,200 Units by mouth daily.   Yes Historical Provider, MD    Allergies  Allergen Reactions  . Levaquin [Levofloxacin] Shortness Of Breath  . Prednisone Shortness Of Breath  . Albuterol Anxiety    Past Surgical History:  Procedure Laterality Date  . ABDOMINAL HYSTERECTOMY    . APPENDECTOMY    . TUBAL LIGATION      Social History  Substance Use Topics  . Smoking status: Never Smoker  . Smokeless tobacco: Never Used  . Alcohol use No     Medication list has been reviewed and updated.  Physical Exam  Constitutional: She is oriented to person, place, and time. She appears well-developed. No distress.  HENT:  Head: Normocephalic and atraumatic.  Neck: Normal range of motion. Neck supple. No thyromegaly present.  Cardiovascular: Normal rate, regular rhythm and normal heart sounds.   Pulmonary/Chest: Effort normal and breath sounds normal. No respiratory distress. She has no  wheezes. She has no rales.  Musculoskeletal: Normal range of motion. She exhibits no edema or tenderness.  Neurological: She is alert and oriented to person, place, and time.  Skin: Skin is warm and dry. No rash noted.  Psychiatric: She has a normal mood and affect. Her behavior is normal. Thought content normal.    BP 116/76 (BP Location: Left Arm, Patient Position: Sitting, Cuff Size: Normal)   Pulse 78   Resp 16   Ht 5\' 5"  (1.651 m)   Wt 168 lb (76.2 kg)   SpO2 95%   BMI 27.96 kg/m   Assessment and Plan: 1. Orthostatic hypotension Increase fluids Consider Midodrine if persistent - Comprehensive metabolic panel  2. Post-nasal drip Recommend Claritin 10 mg per day for cough   Halina Maidens, MD [facility]  10/13/2015

## 2015-10-14 DIAGNOSIS — C44321 Squamous cell carcinoma of skin of nose: Secondary | ICD-10-CM | POA: Diagnosis not present

## 2015-10-14 DIAGNOSIS — Z1283 Encounter for screening for malignant neoplasm of skin: Secondary | ICD-10-CM | POA: Diagnosis not present

## 2015-10-14 DIAGNOSIS — L821 Other seborrheic keratosis: Secondary | ICD-10-CM | POA: Diagnosis not present

## 2015-10-14 DIAGNOSIS — L570 Actinic keratosis: Secondary | ICD-10-CM | POA: Diagnosis not present

## 2015-10-14 DIAGNOSIS — D485 Neoplasm of uncertain behavior of skin: Secondary | ICD-10-CM | POA: Diagnosis not present

## 2015-10-14 LAB — COMPREHENSIVE METABOLIC PANEL
A/G RATIO: 1.6 (ref 1.2–2.2)
ALBUMIN: 4.3 g/dL (ref 3.5–4.7)
ALT: 8 IU/L (ref 0–32)
AST: 19 IU/L (ref 0–40)
Alkaline Phosphatase: 80 IU/L (ref 39–117)
BUN / CREAT RATIO: 21 (ref 12–28)
BUN: 14 mg/dL (ref 8–27)
Bilirubin Total: 0.4 mg/dL (ref 0.0–1.2)
CALCIUM: 9.3 mg/dL (ref 8.7–10.3)
CO2: 27 mmol/L (ref 18–29)
Chloride: 97 mmol/L (ref 96–106)
Creatinine, Ser: 0.67 mg/dL (ref 0.57–1.00)
GFR, EST AFRICAN AMERICAN: 94 mL/min/{1.73_m2} (ref >59)
GFR, EST NON AFRICAN AMERICAN: 82 mL/min/{1.73_m2} (ref >59)
Globulin, Total: 2.7 g/dL (ref 1.5–4.5)
Glucose: 104 mg/dL — ABNORMAL HIGH (ref 65–99)
POTASSIUM: 3.8 mmol/L (ref 3.5–5.2)
Sodium: 137 mmol/L (ref 134–144)
TOTAL PROTEIN: 7 g/dL (ref 6.0–8.5)

## 2015-11-12 DIAGNOSIS — C44321 Squamous cell carcinoma of skin of nose: Secondary | ICD-10-CM | POA: Diagnosis not present

## 2015-11-19 DIAGNOSIS — Z23 Encounter for immunization: Secondary | ICD-10-CM | POA: Diagnosis not present

## 2015-12-26 ENCOUNTER — Ambulatory Visit (INDEPENDENT_AMBULATORY_CARE_PROVIDER_SITE_OTHER): Payer: Medicare Other | Admitting: Internal Medicine

## 2015-12-26 ENCOUNTER — Other Ambulatory Visit: Payer: Self-pay | Admitting: Family Medicine

## 2015-12-26 ENCOUNTER — Encounter: Payer: Self-pay | Admitting: Internal Medicine

## 2015-12-26 VITALS — BP 126/84 | HR 68 | Resp 16 | Ht 65.0 in | Wt 165.0 lb

## 2015-12-26 DIAGNOSIS — I10 Essential (primary) hypertension: Secondary | ICD-10-CM | POA: Diagnosis not present

## 2015-12-26 DIAGNOSIS — N6320 Unspecified lump in the left breast, unspecified quadrant: Secondary | ICD-10-CM | POA: Diagnosis not present

## 2015-12-26 DIAGNOSIS — N6325 Unspecified lump in the left breast, overlapping quadrants: Secondary | ICD-10-CM

## 2015-12-26 DIAGNOSIS — N6322 Unspecified lump in the left breast, upper inner quadrant: Secondary | ICD-10-CM

## 2015-12-26 DIAGNOSIS — N289 Disorder of kidney and ureter, unspecified: Secondary | ICD-10-CM | POA: Diagnosis not present

## 2015-12-26 DIAGNOSIS — R921 Mammographic calcification found on diagnostic imaging of breast: Secondary | ICD-10-CM | POA: Diagnosis not present

## 2015-12-26 DIAGNOSIS — Z0000 Encounter for general adult medical examination without abnormal findings: Secondary | ICD-10-CM | POA: Diagnosis not present

## 2015-12-26 DIAGNOSIS — J4530 Mild persistent asthma, uncomplicated: Secondary | ICD-10-CM | POA: Diagnosis not present

## 2015-12-26 MED ORDER — BUDESONIDE 0.5 MG/2ML IN SUSP: 0.5000 mg | Freq: Two times a day (BID) | RESPIRATORY_TRACT | 5 refills | Status: DC

## 2015-12-26 NOTE — Patient Instructions (Signed)
Health Maintenance  Topic Date Due  . ZOSTAVAX  01/23/1992  . DEXA SCAN  01/22/1997  . TETANUS/TDAP  03/09/2013  . PNA vac Low Risk Adult (2 of 2 - PPSV23) 03/20/2016  . INFLUENZA VACCINE  Completed

## 2015-12-26 NOTE — Progress Notes (Signed)
Patient: Allison Snyder, Female    DOB: Jul 04, 1929, 82 y.o.   MRN: [id number] Visit Date: 12/26/2015  Today's Provider: Halina Maidens, MD   Chief Complaint  Patient presents with  . Medicare Wellness   Subjective:    Annual wellness visit Allison Snyder is a 82 y.o. female who presents today for her Subsequent Annual Wellness Visit. She feels fairly well. She reports exercising none. She reports she is sleeping fairly well.   ----------------------------------------------------------- HPI Breast Mass - had a breast mass last year on mammogram and [place].  Biopsy recommended but patient never follow up.  She does not perform breast self exams.  She denies breast pain or nipple discharge.  Asthma - complains of cough with phlegm and she does not look at it.  She is not using her pulmicort nebulizer.  She denies fever, chills but does have shortness of breath.  Review of Systems  Constitutional: Negative for chills, fatigue and fever.  HENT: Negative for ear pain, sore throat and tinnitus.   Eyes: Negative for visual disturbance.  Respiratory: Positive for cough and shortness of breath. Negative for chest tightness, wheezing and stridor.   Cardiovascular: Negative for chest pain and palpitations. Leg swelling: varicose veins.  Gastrointestinal: Positive for constipation. Negative for abdominal pain, blood in stool and diarrhea.  Genitourinary: Negative for dysuria, hematuria, vaginal bleeding and vaginal discharge.  Musculoskeletal: Positive for arthralgias.  Hematological: Negative for adenopathy.  Psychiatric/Behavioral: Positive for sleep disturbance. Negative for dysphoric mood.    Social History   Social History  . Marital status: Widowed    Spouse name: N/A  . Number of children: N/A  . Years of education: N/A   Occupational History  . Not on file.   Social History Main Topics  . Smoking status: Never Smoker  . Smokeless tobacco: Never Used  . Alcohol use No   . Drug use: No  . Sexual activity: Not on file   Other Topics Concern  . Not on file   Social History Narrative  . No narrative on file    Patient Active Problem List   Diagnosis Date Noted  . Chronic diastolic heart failure ([place]) 04/07/2015  . Essential hypertension 04/07/2015  . Varicose veins of both lower extremities 04/07/2015  . Breast mass, left 12/11/2014  . Bilateral renal cysts 09/23/2014  . Asthma, mild persistent 07/27/2014  . Aortic atherosclerosis ([place]) 07/27/2014  . Neoplasm of uncertain behavior of skin 07/27/2014  . Impaired renal function 07/27/2014  . Urinary system disease 07/27/2014    Past Surgical History:  Procedure Laterality Date  . ABDOMINAL HYSTERECTOMY    . APPENDECTOMY    . TUBAL LIGATION      Her family history includes Colon cancer in her mother; Diabetes in her sister.    Previous Medications   B COMPLEX VITAMINS CAPSULE    Take 1 capsule by mouth daily.   BUDESONIDE (PULMICORT) 0.5 MG/2ML NEBULIZER SOLUTION    Take 2 mLs (0.5 mg total) by nebulization 2 (two) times daily.   CHOLECALCIFEROL (VITAMIN D3) 1000 UNITS CAPS    Take by mouth.   MULTIPLE MINERALS-VITAMINS (CALCIUM CITRATE PLUS/MAGNESIUM) TABS    Take 2 tablets by mouth daily. 1,000-500 mg   OMEGA-3 FATTY ACIDS (FISH OIL) 1000 MG CAPS    Take 2,000 mg by mouth daily. Triple strength fish oil   PYRIDOXINE HCL (VITAMIN B6 PO)    Take by mouth.   SENNOSIDES-DOCUSATE SODIUM (EX-LAX GENTLE STRENGTH PO)  Take 6 capsules by mouth daily.   VITAMIN A 16109 UNIT CAPSULE    Take 10,000 Units by mouth daily. Reported on 04/07/2015   VITAMIN C (ASCORBIC ACID) 500 MG TABLET    Take 500 mg by mouth daily.   VITAMIN E 400 UNIT CAPSULE    Take 1,200 Units by mouth daily.    Patient Care Team: Glean Hess, MD as PCP - General (Family Medicine) Alisa Graff, FNP as Nurse Practitioner (Family Medicine)     Objective:   Vitals: BP 126/84   Pulse 68   Resp 16   Ht 5\' 5"  (1.651 m)    Wt 165 lb (74.8 kg)   SpO2 95%   BMI 27.46 kg/m   Physical Exam  Constitutional: She is oriented to person, place, and time. She appears well-developed. No distress.  HENT:  Head: Normocephalic and atraumatic.  Neck: Normal range of motion. Neck supple. Carotid bruit is not present. No thyromegaly present.  Cardiovascular: Normal rate, regular rhythm and normal heart sounds.   Pulmonary/Chest: Effort normal. No respiratory distress. She has decreased breath sounds. She has wheezes. Right breast exhibits no inverted nipple, no mass, no nipple discharge, no skin change and no tenderness. Left breast exhibits inverted nipple. Left breast exhibits no nipple discharge, no skin change and no tenderness. Mass: 1.3 cm smooth, non tender mass at 10 oclock 1 cm from areola.  Abdominal: Soft. Normal appearance and bowel sounds are normal. There is no tenderness.  Musculoskeletal: Normal range of motion. She exhibits no edema or tenderness.  Neurological: She is alert and oriented to person, place, and time. She has normal reflexes.  Skin: Skin is warm, dry and intact. No rash noted.  Psychiatric: She has a normal mood and affect. Her speech is normal and behavior is normal. Thought content normal.  Nursing note and vitals reviewed.   Activities of Daily Living In your present state of health, do you have any difficulty performing the following activities: 12/26/2015 04/28/2015  Hearing? N Y  Vision? N Y  Difficulty concentrating or making decisions? N Y  Walking or climbing stairs? N Y  Dressing or bathing? N -  Doing errands, shopping? N N  Preparing Food and eating ? N N  Using the Toilet? N N  In the past six months, have you accidently leaked urine? Y N  Do you have problems with loss of bowel control? N N  Managing your Medications? N N  Managing your Finances? N N  Housekeeping or managing your Housekeeping? N N  Some recent data might be hidden    Fall Risk Assessment Fall Risk   12/26/2015 04/28/2015 04/28/2015 04/07/2015 03/04/2015  Falls in the past year? No - No No Yes  Number falls in past yr: - - - - 1  Injury with Fall? - - - - No  Risk for fall due to : - (No Data) Impaired balance/gait - History of fall(s);Other (Comment)  Risk for fall due to (comments): - due to vertigo - - pt stated she gets dizzy at times upon standing  Follow up - - - - Falls prevention discussed      Depression Screen PHQ 2/9 Scores 12/26/2015 04/28/2015 04/07/2015 02/20/2015  PHQ - 2 Score 0 1 1 0    Cognitive Testing - 6-CIT   Correct? Score   What year is it? yes 0 Yes = 0    No = 4  What month is it? yes 0 Yes =  0    No = 3  Remember:     Pia Mau, [address], [place]     What time is it? yes 0 Yes = 0    No = 3  Count backwards from 20 to 1 yes 0 Correct = 0    1 error = 2   More than 1 error = 4  Say the months of the year in reverse. yes 0 Correct = 0    1 error = 2   More than 1 error = 4  What address did I ask you to remember? no 3 Correct = 0  1 error = 2    2 error = 4    3 error = 6    4 error = 8    All wrong = 10       TOTAL SCORE  3/28   Interpretation:  Normal  Normal (0-7) Abnormal (8-28)        Medicare Annual Wellness Visit Summary:  Reviewed patient's Family Medical History Reviewed and updated list of patient's medical providers Assessment of cognitive impairment was done Assessed patient's functional ability Established a written schedule for health screening [place] Completed and Reviewed  Exercise Activities and Dietary recommendations Goals    . Prevent Falls          Continue to stay active and not have any falls.        Immunization History  Administered Date(s) Administered  . Influenza,inj,Quad PF,36+ Mos 12/11/2014  . Influenza-Unspecified 11/22/2015  . Pneumococcal Conjugate-13 03/21/2015  . Td 03/10/2003    Health Maintenance  Topic Date Due  . ZOSTAVAX  01/23/1992  . DEXA SCAN  01/22/1997    . TETANUS/TDAP  03/09/2013  . PNA vac Low Risk Adult (2 of 2 - PPSV23) 03/20/2016  . INFLUENZA VACCINE  Completed     Discussed health benefits of physical activity, and encouraged her to engage in regular exercise appropriate for her age and condition.    ------------------------------------------------------------------------------------------------------------   Assessment & Plan:  1. Medicare annual wellness visit, subsequent Measures satisfied  2. Essential hypertension Stable without medication at this time - CBC with Differential/Platelet - TSH - POCT urinalysis dipstick  3. Mild persistent asthma without complication Need to resume bid dosing - budesonide (PULMICORT) 0.5 MG/2ML nebulizer solution; Take 2 mLs (0.5 mg total) by nebulization 2 (two) times daily.  Dispense: 240 mL; Refill: 5  4. Impaired renal function Check labs - Comprehensive metabolic panel  5. Mass of upper inner quadrant of left breast Needs re-evaluation and probably biopsy   Halina Maidens, MD [facility]  12/26/2015

## 2015-12-27 LAB — COMPREHENSIVE METABOLIC PANEL
A/G RATIO: 1.4 (ref 1.2–2.2)
ALBUMIN: 4.1 g/dL (ref 3.5–4.7)
ALK PHOS: 85 IU/L (ref 39–117)
ALT: 10 IU/L (ref 0–32)
AST: 17 IU/L (ref 0–40)
BILIRUBIN TOTAL: 0.5 mg/dL (ref 0.0–1.2)
BUN / CREAT RATIO: 19 (ref 12–28)
BUN: 13 mg/dL (ref 8–27)
CHLORIDE: 100 mmol/L (ref 96–106)
CO2: 30 mmol/L — ABNORMAL HIGH (ref 18–29)
CREATININE: 0.7 mg/dL (ref 0.57–1.00)
Calcium: 9.2 mg/dL (ref 8.7–10.3)
GFR calc Af Amer: 93 mL/min/{1.73_m2} (ref >59)
GFR calc non Af Amer: 80 mL/min/{1.73_m2} (ref >59)
GLOBULIN, TOTAL: 2.9 g/dL (ref 1.5–4.5)
Glucose: 96 mg/dL (ref 65–99)
POTASSIUM: 4.4 mmol/L (ref 3.5–5.2)
SODIUM: 146 mmol/L — AB (ref 134–144)
Total Protein: 7 g/dL (ref 6.0–8.5)

## 2015-12-27 LAB — CBC WITH DIFFERENTIAL/PLATELET
BASOS ABS: 0 10*3/uL (ref 0.0–0.2)
BASOS: 1 %
EOS (ABSOLUTE): 0.1 10*3/uL (ref 0.0–0.4)
Eos: 2 %
HEMATOCRIT: 44.6 % (ref 34.0–46.6)
HEMOGLOBIN: 15.2 g/dL (ref 11.1–15.9)
IMMATURE GRANULOCYTES: 0 %
Immature Grans (Abs): 0 10*3/uL (ref 0.0–0.1)
LYMPHS: 34 %
Lymphocytes Absolute: 1.9 10*3/uL (ref 0.7–3.1)
MCH: 30.3 pg (ref 26.6–33.0)
MCHC: 34.1 g/dL (ref 31.5–35.7)
MCV: 89 fL (ref 79–97)
Monocytes Absolute: 0.4 10*3/uL (ref 0.1–0.9)
Monocytes: 6 %
NEUTROS PCT: 57 %
Neutrophils Absolute: 3.2 10*3/uL (ref 1.4–7.0)
Platelets: 214 10*3/uL (ref 150–379)
RBC: 5.02 x10E6/uL (ref 3.77–5.28)
RDW: 13.8 % (ref 12.3–15.4)
WBC: 5.5 10*3/uL (ref 3.4–10.8)

## 2015-12-27 LAB — TSH: TSH: 0.874 u[IU]/mL (ref 0.450–4.500)

## 2016-01-15 ENCOUNTER — Other Ambulatory Visit: Payer: Self-pay | Admitting: Internal Medicine

## 2016-01-15 ENCOUNTER — Ambulatory Visit
Admission: RE | Admit: 2016-01-15 | Discharge: 2016-01-15 | Disposition: A | Discharge status: Home / Self Care | Payer: Medicare Other | Source: Ambulatory Visit | Attending: Internal Medicine | Admitting: Internal Medicine

## 2016-01-15 DIAGNOSIS — N6320 Unspecified lump in the left breast, unspecified quadrant: Principal | ICD-10-CM

## 2016-01-15 DIAGNOSIS — R921 Mammographic calcification found on diagnostic imaging of breast: Secondary | ICD-10-CM

## 2016-01-15 DIAGNOSIS — N6322 Unspecified lump in the left breast, upper inner quadrant: Secondary | ICD-10-CM | POA: Insufficient documentation

## 2016-01-15 DIAGNOSIS — N6325 Unspecified lump in the left breast, overlapping quadrants: Secondary | ICD-10-CM

## 2016-05-21 ENCOUNTER — Emergency Department: Payer: Medicare Other

## 2016-05-21 ENCOUNTER — Emergency Department
Admission: EM | Admit: 2016-05-21 | Discharge: 2016-05-21 | Disposition: A | Discharge status: Home / Self Care | Payer: Medicare Other | Attending: Emergency Medicine | Admitting: Emergency Medicine

## 2016-05-21 ENCOUNTER — Encounter: Payer: Self-pay | Admitting: Emergency Medicine

## 2016-05-21 DIAGNOSIS — J45909 Unspecified asthma, uncomplicated: Secondary | ICD-10-CM | POA: Insufficient documentation

## 2016-05-21 DIAGNOSIS — I509 Heart failure, unspecified: Secondary | ICD-10-CM | POA: Insufficient documentation

## 2016-05-21 DIAGNOSIS — G459 Transient cerebral ischemic attack, unspecified: Secondary | ICD-10-CM | POA: Diagnosis not present

## 2016-05-21 DIAGNOSIS — I110 Hypertensive heart disease with heart failure: Secondary | ICD-10-CM | POA: Diagnosis not present

## 2016-05-21 DIAGNOSIS — R200 Anesthesia of skin: Secondary | ICD-10-CM | POA: Diagnosis present

## 2016-05-21 DIAGNOSIS — Z79899 Other long term (current) drug therapy: Secondary | ICD-10-CM | POA: Diagnosis not present

## 2016-05-21 DIAGNOSIS — R531 Weakness: Secondary | ICD-10-CM | POA: Diagnosis not present

## 2016-05-21 LAB — COMPREHENSIVE METABOLIC PANEL
ALT: 14 U/L (ref 14–54)
ANION GAP: 6 (ref 5–15)
AST: 22 U/L (ref 15–41)
Albumin: 4 g/dL (ref 3.5–5.0)
Alkaline Phosphatase: 61 U/L (ref 38–126)
BILIRUBIN TOTAL: 0.8 mg/dL (ref 0.3–1.2)
BUN: 20 mg/dL (ref 6–20)
CALCIUM: 9.1 mg/dL (ref 8.9–10.3)
CO2: 30 mmol/L (ref 22–32)
CREATININE: 0.89 mg/dL (ref 0.44–1.00)
Chloride: 102 mmol/L (ref 101–111)
GFR calc non Af Amer: 58 mL/min — ABNORMAL LOW (ref >60)
Glucose, Bld: 106 mg/dL — ABNORMAL HIGH (ref 65–99)
Potassium: 3.6 mmol/L (ref 3.5–5.1)
Sodium: 138 mmol/L (ref 135–145)
Total Protein: 7.2 g/dL (ref 6.5–8.1)

## 2016-05-21 LAB — CBC
HEMATOCRIT: 42.7 % (ref 35.0–47.0)
Hemoglobin: 14.8 g/dL (ref 12.0–16.0)
MCH: 30.8 pg (ref 26.0–34.0)
MCHC: 34.7 g/dL (ref 32.0–36.0)
MCV: 88.8 fL (ref 80.0–100.0)
PLATELETS: 204 10*3/uL (ref 150–440)
RBC: 4.81 MIL/uL (ref 3.80–5.20)
RDW: 13.4 % (ref 11.5–14.5)
WBC: 6.1 10*3/uL (ref 3.6–11.0)

## 2016-05-21 LAB — DIFFERENTIAL
Basophils Absolute: 0 10*3/uL (ref 0–0.1)
Basophils Relative: 1 %
EOS PCT: 1 %
Eosinophils Absolute: 0 10*3/uL (ref 0–0.7)
LYMPHS PCT: 27 %
Lymphs Abs: 1.6 10*3/uL (ref 1.0–3.6)
MONOS PCT: 9 %
Monocytes Absolute: 0.5 10*3/uL (ref 0.2–0.9)
NEUTROS ABS: 3.8 10*3/uL (ref 1.4–6.5)
Neutrophils Relative %: 62 %

## 2016-05-21 LAB — PROTIME-INR
INR: 1.04
PROTHROMBIN TIME: 13.6 s (ref 11.4–15.2)

## 2016-05-21 LAB — APTT: aPTT: 29 seconds (ref 24–36)

## 2016-05-21 LAB — GLUCOSE, CAPILLARY: GLUCOSE-CAPILLARY: 100 mg/dL — AB (ref 65–99)

## 2016-05-21 LAB — TROPONIN I

## 2016-05-21 MED ORDER — ASPIRIN EC 81 MG PO TBEC: 81.0000 mg | DELAYED_RELEASE_TABLET | Freq: Every day | ORAL | 3 refills | Status: AC

## 2016-05-21 MED ORDER — ASPIRIN 81 MG PO CHEW
324.0000 mg | CHEWABLE_TABLET | Freq: Once | ORAL | Status: AC
  Administered 2016-05-21: 324 mg via ORAL
  Filled 2016-05-21: qty 4

## 2016-05-21 NOTE — ED Provider Notes (Signed)
[facility] Emergency Department Provider Note  Time seen: 3:45 PM  I have reviewed the triage vital signs and the nursing notes.   HISTORY  Chief Complaint Code Stroke    HPI Allison Snyder is a 83 y.o. female with a past medical history of CHF, hypoglycemia, hypertension, presents to the emergency department with facial numbness. According to the patient she was driving and at 0:97 PM she states she had a strange sensation in her head followed by left facial numbness. He said immediately called her daughter and came to the emergency department in code stroke protocols initiated. Patient states that 2:30 PM she developed a strain sensation in her head, has difficulty describing the sensation but states it was not a headache. States she had left facial numbness. Denies any arm or leg weakness or numbness. Denies confusion headache or slurred speech. Upon arrival to the emergency department the patient states her symptoms have completely resolved.  Past Medical History:  Diagnosis Date  . Asthma   . CHF (congestive heart failure) ([place])   . Gallbladder & bile duct stone with obstruction    issues off and on  . H/O tubal ligation   . History of appendectomy   . Hypoglycemic syndrome     Patient Active Problem List   Diagnosis Date Noted  . Chronic diastolic heart failure ([place]) 04/07/2015  . Essential hypertension 04/07/2015  . Varicose veins of both lower extremities 04/07/2015  . Breast mass, left 12/11/2014  . Bilateral renal cysts 09/23/2014  . Asthma, mild persistent 07/27/2014  . Aortic atherosclerosis ([place]) 07/27/2014  . Neoplasm of uncertain behavior of skin 07/27/2014  . Impaired renal function 07/27/2014  . Urinary system disease 07/27/2014    Past Surgical History:  Procedure Laterality Date  . ABDOMINAL HYSTERECTOMY    . APPENDECTOMY    . TUBAL LIGATION      Prior to Admission medications   Medication Sig Start Date End Date Taking?  Authorizing Provider  budesonide (PULMICORT) 0.5 MG/2ML nebulizer solution Take 2 mLs (0.5 mg total) by nebulization 2 (two) times daily. 12/26/15  Yes Glean Hess, MD  b complex vitamins capsule Take 1 capsule by mouth daily.    Historical Provider, MD  Cholecalciferol (VITAMIN D3) 1000 units CAPS Take by mouth.    Historical Provider, MD  Multiple Minerals-Vitamins (CALCIUM CITRATE PLUS/MAGNESIUM) TABS Take 2 tablets by mouth daily. 1,000-500 mg    Historical Provider, MD  Omega-3 Fatty Acids (FISH OIL) 1000 MG CAPS Take 2,000 mg by mouth daily. Triple strength fish oil    Historical Provider, MD  Pyridoxine HCl (VITAMIN B6 PO) Take by mouth.    Historical Provider, MD  Sennosides-Docusate Sodium (EX-LAX GENTLE STRENGTH PO) Take 6 capsules by mouth daily.    Historical Provider, MD  vitamin A 10000 UNIT capsule Take 10,000 Units by mouth daily. Reported on 04/07/2015    Historical Provider, MD  vitamin C (ASCORBIC ACID) 500 MG tablet Take 500 mg by mouth daily.    Historical Provider, MD  vitamin E 400 UNIT capsule Take 1,200 Units by mouth daily.    Historical Provider, MD    Allergies  Allergen Reactions  . Levaquin [Levofloxacin] Shortness Of Breath  . Prednisone Shortness Of Breath  . Albuterol Anxiety    Family History  Problem Relation Age of Onset  . Colon cancer Mother   . Diabetes Sister     Social History Social History  Substance Use Topics  . Smoking status:  Never Smoker  . Smokeless tobacco: Never Used  . Alcohol use No    Review of Systems Constitutional: Negative for fever. Cardiovascular: Negative for chest pain. Respiratory: Negative for shortness of breath. Gastrointestinal: Negative for abdominal pain Neurological: Negative for . Left facial numbness which has resolved. Denies any other weakness or numbness. 10-point ROS otherwise negative.  ____________________________________________   PHYSICAL EXAM:  VITAL SIGNS: ED Triage Vitals [05/21/16  1525]  Enc Vitals Group     BP 132/62     Pulse Rate 74     Resp 18     Temp 97.9 F (36.6 C)     Temp Source Oral     SpO2 97 %     Weight      Height      Head Circumference      Peak Flow      Pain Score      Pain Loc      Pain Edu?      Excl. in [place]?     Constitutional: Alert and oriented. Well appearing and in no distress. Eyes: Normal exam ENT   Head: Normocephalic and atraumatic.   Mouth/Throat: Mucous membranes are moist. Cardiovascular: Normal rate, regular rhythm. No murmur Respiratory: Normal respiratory effort without tachypnea nor retractions. Breath sounds are clear Gastrointestinal: Soft and nontender. No distention.  Musculoskeletal: Nontender with normal range of motion in all extremities Neurologic:  Normal speech and language. No gross focal neurologic deficits. Equal grip strength bilaterally. No pronator drift./5 motor in all extremities. No cranial nerve deficits. Sensation intact and equal bilaterally. Skin:  Skin is warm, dry and intact.  Psychiatric: Mood and affect are normal. Speech and behavior are normal.   ____________________________________________    EKG  EKG reviewed and interpreted by myself shows normal sinus rhythm 80 bpm, slightly widened QRS, normal axis, slightly prolonged QTC, nonspecific ST changes without ST elevation. ____________________________________________    RADIOLOGY  CT head negative  ____________________________________________   INITIAL IMPRESSION / ASSESSMENT AND PLAN / ED COURSE  Pertinent labs & imaging results that were available during my care of the patient were reviewed by me and considered in my medical decision making (see chart for details).  Patient presents to the emergency department with symptoms concerning for a transient ischemic attack. Patient states she was driving at 5:42 PM when she felt a strange sensation in her head followed by left facial numbness she estimates this lasted  approximately 30 minutes before completely resolving. In the emergency department patient denies any symptoms at this time. Has a normal/intact neurological exam. CT scan of the head is negative for acute change. We will check EKG, labs. Given the patient's concerning symptoms we will likely admit for TIA workup.  Patient's labs are largely within normal limits. Neurology is seen the patient and agree with TIA. I discussed these results with the patient as well as our desire to admit to the hospital for further workup including carotid ultrasounds, echocardiogram, MRI. Patient adamantly against admission. States she does not care and she is going home. Patient has capacity and understands the risks but still wishes to leave. She states she will follow-up with her primary care doctor. I discussed with the patient the need to have the ultrasounds of the carotids and heart performed at least on an outpatient basis by her primary care doctor, she is agreeable. Daughter is agreeable. We will discharge from the emergency department. The patient will sign out [place].  ____________________________________________  FINAL CLINICAL IMPRESSION(S) / ED DIAGNOSES  Transient ischemic attack    Harvest Dark, MD 05/21/16 1640

## 2016-05-21 NOTE — Progress Notes (Signed)
[place] responded to a PG for Code Stroke. Pt is being attended to by RN. Granddaughter is bedside. Pt stated that she has had many ups and downs in life, but God has seen her through. Pt stated that her symptoms have eased. CH provided presence, prayer, and offered hospitality for the granddaughter. CH is available for follow up as needed.    05/21/16 1600  Clinical Encounter Type  Visited With Patient;Patient and family together;Health care provider  Visit Type Initial;Spiritual support;Code;ED (CODE Stroke)  Referral From Nurse  Consult/Referral To Chaplain  Spiritual Encounters  Spiritual Needs Prayer;Emotional

## 2016-05-21 NOTE — ED Notes (Signed)
CODE STROKE CALLED TO MINDY AT 333

## 2016-05-21 NOTE — Discharge Instructions (Signed)
As we discussed please follow-up with her primary care doctor soon as possible to arrange further stroke/mini stroke workup including ultrasounds of your carotid vessels, ultrasound of your heart and MRI of your brain. Return to the emergency department for any further symptoms including headache, weakness, numbness, slurred speech, confusion.

## 2016-05-21 NOTE — ED Triage Notes (Signed)
Pt states that she was driving today and she felt like she was going to pass out. Pt reports tingling and numbness on the left side of her face. Pt states that these symptoms start approximately 1 hour PTA. Pt states that her symptoms have since resolved.

## 2016-05-22 ENCOUNTER — Encounter: Payer: Self-pay | Admitting: Internal Medicine

## 2016-05-22 DIAGNOSIS — G459 Transient cerebral ischemic attack, unspecified: Secondary | ICD-10-CM | POA: Insufficient documentation

## 2016-05-25 ENCOUNTER — Emergency Department: Payer: Medicare Other

## 2016-05-25 ENCOUNTER — Encounter: Payer: Self-pay | Admitting: Internal Medicine

## 2016-05-25 ENCOUNTER — Ambulatory Visit (INDEPENDENT_AMBULATORY_CARE_PROVIDER_SITE_OTHER): Payer: Medicare Other | Admitting: Internal Medicine

## 2016-05-25 ENCOUNTER — Encounter: Payer: Self-pay | Admitting: Emergency Medicine

## 2016-05-25 ENCOUNTER — Emergency Department
Admission: EM | Admit: 2016-05-25 | Discharge: 2016-05-25 | Discharge status: Against Medical Advice | Payer: Medicare Other | Attending: Emergency Medicine | Admitting: Emergency Medicine

## 2016-05-25 VITALS — BP 138/64 | HR 60 | Ht 65.0 in | Wt 166.0 lb

## 2016-05-25 DIAGNOSIS — Z7982 Long term (current) use of aspirin: Secondary | ICD-10-CM | POA: Diagnosis not present

## 2016-05-25 DIAGNOSIS — R05 Cough: Secondary | ICD-10-CM | POA: Diagnosis not present

## 2016-05-25 DIAGNOSIS — I110 Hypertensive heart disease with heart failure: Secondary | ICD-10-CM | POA: Diagnosis not present

## 2016-05-25 DIAGNOSIS — I10 Essential (primary) hypertension: Secondary | ICD-10-CM

## 2016-05-25 DIAGNOSIS — Z79899 Other long term (current) drug therapy: Secondary | ICD-10-CM | POA: Insufficient documentation

## 2016-05-25 DIAGNOSIS — R42 Dizziness and giddiness: Secondary | ICD-10-CM

## 2016-05-25 DIAGNOSIS — J45901 Unspecified asthma with (acute) exacerbation: Secondary | ICD-10-CM

## 2016-05-25 DIAGNOSIS — R0602 Shortness of breath: Secondary | ICD-10-CM | POA: Diagnosis not present

## 2016-05-25 DIAGNOSIS — G459 Transient cerebral ischemic attack, unspecified: Secondary | ICD-10-CM | POA: Diagnosis not present

## 2016-05-25 DIAGNOSIS — I5032 Chronic diastolic (congestive) heart failure: Secondary | ICD-10-CM | POA: Insufficient documentation

## 2016-05-25 LAB — CBC
HCT: 44.9 % (ref 35.0–47.0)
Hemoglobin: 15.5 g/dL (ref 12.0–16.0)
MCH: 30.9 pg (ref 26.0–34.0)
MCHC: 34.5 g/dL (ref 32.0–36.0)
MCV: 89.5 fL (ref 80.0–100.0)
PLATELETS: 191 10*3/uL (ref 150–440)
RBC: 5.01 MIL/uL (ref 3.80–5.20)
RDW: 13 % (ref 11.5–14.5)
WBC: 7.6 10*3/uL (ref 3.6–11.0)

## 2016-05-25 LAB — COMPREHENSIVE METABOLIC PANEL
ALT: 12 U/L — AB (ref 14–54)
ANION GAP: 7 (ref 5–15)
AST: 23 U/L (ref 15–41)
Albumin: 4.4 g/dL (ref 3.5–5.0)
Alkaline Phosphatase: 66 U/L (ref 38–126)
BUN: 21 mg/dL — ABNORMAL HIGH (ref 6–20)
CHLORIDE: 103 mmol/L (ref 101–111)
CO2: 29 mmol/L (ref 22–32)
Calcium: 9.6 mg/dL (ref 8.9–10.3)
Creatinine, Ser: 0.78 mg/dL (ref 0.44–1.00)
Glucose, Bld: 111 mg/dL — ABNORMAL HIGH (ref 65–99)
POTASSIUM: 3.7 mmol/L (ref 3.5–5.1)
Sodium: 139 mmol/L (ref 135–145)
Total Bilirubin: 0.7 mg/dL (ref 0.3–1.2)
Total Protein: 7.9 g/dL (ref 6.5–8.1)

## 2016-05-25 LAB — TROPONIN I

## 2016-05-25 MED ORDER — ALBUTEROL SULFATE (2.5 MG/3ML) 0.083% IN NEBU
5.0000 mg | INHALATION_SOLUTION | Freq: Once | RESPIRATORY_TRACT | Status: AC
  Administered 2016-05-25: 5 mg via RESPIRATORY_TRACT
  Filled 2016-05-25: qty 6

## 2016-05-25 NOTE — Patient Instructions (Signed)
Leave the ZIO on for at least one week.  After 10 days you can remove it and mail it back.  It takes about 2 weeks to get the results and I will call you when I have them.

## 2016-05-25 NOTE — Progress Notes (Signed)
Date:  05/25/2016   Name:  Allison Snyder   DOB:  [birth year]   MRN:  [id number]   Chief Complaint: Dizziness (Getting a lot worse. Feels weak this morning. When pt walked in exam room she almost fell over. Just standing up causes worse dizziness. Pt seems completely confused, and keeps forgeting what she is talking about. ) Hospital follow up (ER) - seen on 3/16 for numbness on the side of her face.  The sx resolved after about 30 minutes.  She has had no further sx of weakness, numbness or falls.  She is complaining of being more dizzy.   She left the ER AMA when they wanted to admit her.  She was embarrassed about the way she was treated - her clothes were removed in view of men. Dizziness has been a chronic complaint.  She has thus far declined ENT evaluation.  Since the TIA like event she has had more dizziness but no vomiting.   Review of Systems  Constitutional: Negative for diaphoresis and fatigue.  Eyes: Negative for visual disturbance.  Respiratory: Positive for shortness of breath. Negative for cough, chest tightness and wheezing.   Cardiovascular: Negative for chest pain and palpitations.  Gastrointestinal: Negative for nausea and vomiting.  Neurological: Negative for dizziness, tremors, seizures, syncope, speech difficulty, weakness, numbness and headaches.    Patient Active Problem List   Diagnosis Date Noted  . Transient cerebral ischemia 05/22/2016  . Chronic diastolic heart failure ([place]) 04/07/2015  . Essential hypertension 04/07/2015  . Varicose veins of both lower extremities 04/07/2015  . Breast mass, left 12/11/2014  . Bilateral renal cysts 09/23/2014  . Asthma, mild persistent 07/27/2014  . Aortic atherosclerosis ([place]) 07/27/2014  . Neoplasm of uncertain behavior of skin 07/27/2014  . Impaired renal function 07/27/2014  . Urinary system disease 07/27/2014    Prior to Admission medications   Medication Sig Start Date End Date Taking? Authorizing  Provider  aspirin EC 81 MG tablet Take 1 tablet (81 mg total) by mouth daily. 05/21/16 05/21/17 Yes Harvest Dark, MD  b complex vitamins capsule Take 1 capsule by mouth daily.   Yes Historical Provider, MD  budesonide (PULMICORT) 0.5 MG/2ML nebulizer solution Take 2 mLs (0.5 mg total) by nebulization 2 (two) times daily. 12/26/15  Yes Glean Hess, MD  calcium-vitamin D 250-100 MG-UNIT tablet Take 1 tablet by mouth 2 (two) times daily.   Yes Historical Provider, MD  Cholecalciferol (VITAMIN D3) 1000 units CAPS Take by mouth.   Yes Historical Provider, MD  Multiple Minerals-Vitamins (CALCIUM CITRATE PLUS/MAGNESIUM) TABS Take 2 tablets by mouth daily. 1,000-500 mg   Yes Historical Provider, MD  Omega-3 Fatty Acids (FISH OIL) 1000 MG CAPS Take 2,000 mg by mouth daily. Triple strength fish oil   Yes Historical Provider, MD  Pyridoxine HCl (VITAMIN B6 PO) Take by mouth.   Yes Historical Provider, MD  Sennosides-Docusate Sodium (EX-LAX GENTLE STRENGTH PO) Take 6 capsules by mouth daily.   Yes Historical Provider, MD  vitamin A 10000 UNIT capsule Take 10,000 Units by mouth daily. Reported on 04/07/2015   Yes Historical Provider, MD  vitamin C (ASCORBIC ACID) 500 MG tablet Take 500 mg by mouth daily.   Yes Historical Provider, MD  vitamin E 400 UNIT capsule Take 1,200 Units by mouth daily.   Yes Historical Provider, MD    Allergies  Allergen Reactions  . Levaquin [Levofloxacin] Shortness Of Breath  . Prednisone Shortness Of Breath  . Albuterol Anxiety  Past Surgical History:  Procedure Laterality Date  . ABDOMINAL HYSTERECTOMY    . APPENDECTOMY    . TUBAL LIGATION      Social History  Substance Use Topics  . Smoking status: Never Smoker  . Smokeless tobacco: Never Used  . Alcohol use No     Medication list has been reviewed and updated.   Physical Exam  Constitutional: She is oriented to person, place, and time. She appears well-developed. No distress.  HENT:  Head:  Normocephalic and atraumatic.  Eyes: Conjunctivae and EOM are normal. Pupils are equal, round, and reactive to light.  Neck: Carotid bruit is not present.  Cardiovascular: Normal rate, regular rhythm and normal heart sounds.   No extrasystoles are present.  Pulmonary/Chest: Effort normal. No respiratory distress. She has decreased breath sounds. She has no wheezes. She has no rhonchi.  Abdominal: Soft. Normal appearance. There is no tenderness.  Musculoskeletal: Normal range of motion. She exhibits no edema.  Neurological: She is alert and oriented to person, place, and time.  Skin: Skin is warm and dry. No rash noted.  Psychiatric: She has a normal mood and affect. Her behavior is normal. Thought content normal.  Nursing note and vitals reviewed.   BP 138/64 (BP Location: Left Arm, Patient Position: Sitting, Cuff Size: Normal)   Pulse 60   Ht 5\' 5"  (1.651 m)   Wt 166 lb (75.3 kg)   SpO2 95%   BMI 27.62 kg/m   Assessment and Plan: 1. Transient cerebral ischemia, unspecified type Placed ZIO patch Continue aspirin - US Carotid Duplex Bilateral; Future  2. Essential hypertension controlled  3. Dizziness This has been persistent - now perceived as worse Consider ENT referral   No orders of the defined types were placed in this encounter.   Halina Maidens, MD [facility]  05/25/2016

## 2016-05-25 NOTE — ED Triage Notes (Signed)
Pt presents to ED with sob since around noon today. Pt states she had Zio XT monitor placed today at her MD office in [place] and shortly after leaving felt like her "asthma was acting up". Unsure of cause. Pt states her cough is wet sounding and at times has audible wheezing present. Currently has no increased work of breathing noted.

## 2016-05-25 NOTE — Discharge Instructions (Signed)
You have decided he must leave the department right now. Obviously we would like to keep you here longer and do further workup but at this point you are declining to stay. This is certainly her choice but does limit our ability to continue to assess your medical problems as we've discussed. Please return to the emergency room if you have any new or worrisome symptoms. Follow closely with her primary care doctor.

## 2016-05-25 NOTE — ED Provider Notes (Signed)
[facility] Emergency Department Provider Note  ____________________________________________   I have reviewed the triage vital signs and the nursing notes.   HISTORY  Chief Complaint Shortness of Breath    HPI Allison Snyder is a 83 y.o. female resents today complaining of cough and wheeze free patient has asthma. She states it started earlier today. Patient has been given a monitor. She wonders heart monitors making her asthma acting up. Patient does use nebulizers at home. She is not actually allergic to albuterol. Patient states that all this began gradually this afternoon. She has had multiple episodes of asthma before. She has no home oxygen. She was seen here a week ago for possible TIA and left AMA.     Past Medical History:  Diagnosis Date  . Asthma   . CHF (congestive heart failure) ([place])   . Gallbladder & bile duct stone with obstruction    issues off and on  . H/O tubal ligation   . History of appendectomy   . Hypoglycemic syndrome     Patient Active Problem List   Diagnosis Date Noted  . Transient cerebral ischemia 05/22/2016  . Chronic diastolic heart failure ([place]) 04/07/2015  . Essential hypertension 04/07/2015  . Varicose veins of both lower extremities 04/07/2015  . Breast mass, left 12/11/2014  . Bilateral renal cysts 09/23/2014  . Asthma, mild persistent 07/27/2014  . Aortic atherosclerosis ([place]) 07/27/2014  . Neoplasm of uncertain behavior of skin 07/27/2014  . Impaired renal function 07/27/2014  . Urinary system disease 07/27/2014    Past Surgical History:  Procedure Laterality Date  . ABDOMINAL HYSTERECTOMY    . APPENDECTOMY    . TUBAL LIGATION      Prior to Admission medications   Medication Sig Start Date End Date Taking? Authorizing Provider  aspirin EC 81 MG tablet Take 1 tablet (81 mg total) by mouth daily. 05/21/16 05/21/17  Harvest Dark, MD  b complex vitamins capsule Take 1 capsule by mouth daily.     Historical Provider, MD  budesonide (PULMICORT) 0.5 MG/2ML nebulizer solution Take 2 mLs (0.5 mg total) by nebulization 2 (two) times daily. 12/26/15   Glean Hess, MD  calcium-vitamin D 250-100 MG-UNIT tablet Take 1 tablet by mouth 2 (two) times daily.    Historical Provider, MD  Cholecalciferol (VITAMIN D3) 1000 units CAPS Take by mouth.    Historical Provider, MD  Multiple Minerals-Vitamins (CALCIUM CITRATE PLUS/MAGNESIUM) TABS Take 2 tablets by mouth daily. 1,000-500 mg    Historical Provider, MD  Omega-3 Fatty Acids (FISH OIL) 1000 MG CAPS Take 2,000 mg by mouth daily. Triple strength fish oil    Historical Provider, MD  Pyridoxine HCl (VITAMIN B6 PO) Take by mouth.    Historical Provider, MD  Sennosides-Docusate Sodium (EX-LAX GENTLE STRENGTH PO) Take 6 capsules by mouth daily.    Historical Provider, MD  vitamin A 10000 UNIT capsule Take 10,000 Units by mouth daily. Reported on 04/07/2015    Historical Provider, MD  vitamin C (ASCORBIC ACID) 500 MG tablet Take 500 mg by mouth daily.    Historical Provider, MD  vitamin E 400 UNIT capsule Take 1,200 Units by mouth daily.    Historical Provider, MD    Allergies Levaquin [levofloxacin]; Prednisone; and Albuterol  Family History  Problem Relation Age of Onset  . Colon cancer Mother   . Diabetes Sister     Social History Social History  Substance Use Topics  . Smoking status: Never Smoker  . Smokeless tobacco:  Never Used  . Alcohol use No    Review of Systems Constitutional: No fever/chills Eyes: No visual changes. ENT: No sore throat. No stiff neck no neck pain Cardiovascular: Denies chest pain. Respiratory: see hpi Gastrointestinal:   no vomiting.  No diarrhea.  No constipation. Genitourinary: Negative for dysuria. Musculoskeletal: Negative lower extremity swelling Skin: Negative for rash. Neurological: Negative for severe headaches, focal weakness or numbness. 10-point ROS otherwise  negative.  ____________________________________________   PHYSICAL EXAM:  VITAL SIGNS: ED Triage Vitals [05/25/16 1906]  Enc Vitals Group     BP 135/62     Pulse Rate 83     Resp 18     Temp 98.3 F (36.8 C)     Temp Source Oral     SpO2 97 %     Weight 166 lb (75.3 kg)     Height 5\' 5"  (1.651 m)     Head Circumference      Peak Flow      Pain Score      Pain Loc      Pain Edu?      Excl. in [place]?     Constitutional: Alert and oriented. Well appearing and in no acute distress. Eyes: Conjunctivae are normal. PERRL. EOMI. Head: Atraumatic. Nose: No congestion/rhinnorhea. Mouth/Throat: Mucous membranes are moist.  Oropharynx non-erythematous. Neck: No stridor.   Nontender with no meningismus Cardiovascular: Normal rate, regular rhythm. Grossly normal heart sounds.  Good peripheral circulation. Respiratory: Normal respiratory effort.  No retractions. [place] no wheeze noted in the bases. No rales or rhonchi Abdominal: Soft and nontender. No distention. No guarding no rebound Back:  There is no focal tenderness or step off.  there is no midline tenderness there are no lesions noted. there is no CVA tenderness Musculoskeletal: No lower extremity tenderness, no upper extremity tenderness. No joint effusions, no DVT signs strong distal pulses no edema Neurologic:  Normal speech and language. No gross focal neurologic deficits are appreciated.  Skin:  Skin is warm, dry and intact. No rash noted. Psychiatric: Mood and affect are normal. Speech and behavior are normal.  ____________________________________________   LABS (all labs ordered are listed, but only abnormal results are displayed)  Labs Reviewed  COMPREHENSIVE METABOLIC PANEL - Abnormal; Notable for the following:       Result Value   Glucose, Bld 111 (*)    BUN 21 (*)    ALT 12 (*)    All other components within normal limits  CBC  TROPONIN I   ____________________________________________  EKG  I personally  interpreted any EKGs ordered by me or triage  ____________________________________________  RADIOLOGY  I reviewed any imaging ordered by me or triage that were performed during my shift and, if possible, patient and/or family made aware of any abnormal findings. ____________________________________________   PROCEDURES  Procedure(s) performed: None  Procedures  Critical Care performed: None  ____________________________________________   INITIAL IMPRESSION / ASSESSMENT AND PLAN / ED COURSE  Pertinent labs & imaging results that were available during my care of the patient were reviewed by me and considered in my medical decision making (see chart for details).  Patient with cough and wheeze today, no fever no chills, speech in full sentences nontoxic. No evidence of anaphylaxis. Given a history of asthma I did suggest prednisone but she refuses because that she is "allergic it made her had a stroke" patient did receive a nebulizer 5 mg of albuterol for me and at this time she is moving air very  well. Very slight wheeze still present. She refuses to stay for any more care. She refuses EKG. She is adamant that she will leave AMA. I've explained to her that like to observe her more on the emergency department and see that she is clear but she refuses. No evidence of active infection at this time no evidence of ACS PE or dissection. Workup is of course limited because patient, as with her prior visit, is refusing multiple interventions and is demanding discharge at this time. She is with her family. They understand the risks of discharge. They agree with going home however they will stay with her and they will return if she feels worse in any way. Given the patient seems to understand all the ramifications of her decision making, she was as risk benefits and alternative of departure and she is adamant that she will go, we'll discharge her. I will sign out discharge instructions for her to  ensure that she understands that she must come back if she feels worse and we will have her return for any new or worrisome symptoms. Patient is in understanding of the risks benefits of chart is a going home and the risks benefits and alternatives of staying and all the other treatment modalities and we have discussed and is eager to leave. Nothing to suggest PE or ACS to the extent that I can determine.    ____________________________________________   FINAL CLINICAL IMPRESSION(S) / ED DIAGNOSES  Final diagnoses:  None      This chart was dictated using voice recognition software.  Despite best efforts to proofread,  errors can occur which can change meaning.      Schuyler Amor, MD 05/25/16 2116

## 2016-05-25 NOTE — ED Notes (Signed)
Pt O2 saturation 92% and requested oxygen. Pt placed on 2L nasal canula.

## 2016-05-26 ENCOUNTER — Telehealth: Payer: Self-pay

## 2016-05-26 ENCOUNTER — Ambulatory Visit (INDEPENDENT_AMBULATORY_CARE_PROVIDER_SITE_OTHER): Payer: Medicare Other | Admitting: Internal Medicine

## 2016-05-26 ENCOUNTER — Encounter: Payer: Self-pay | Admitting: Internal Medicine

## 2016-05-26 VITALS — BP 98/64 | HR 78 | Ht 65.0 in | Wt 166.0 lb

## 2016-05-26 DIAGNOSIS — J40 Bronchitis, not specified as acute or chronic: Secondary | ICD-10-CM | POA: Diagnosis not present

## 2016-05-26 DIAGNOSIS — J4530 Mild persistent asthma, uncomplicated: Secondary | ICD-10-CM | POA: Diagnosis not present

## 2016-05-26 MED ORDER — AZITHROMYCIN 250 MG PO TABS: ORAL_TABLET | ORAL | 0 refills | Status: DC

## 2016-05-26 MED ORDER — BUDESONIDE 0.5 MG/2ML IN SUSP: 0.5000 mg | Freq: Two times a day (BID) | RESPIRATORY_TRACT | 5 refills | Status: DC

## 2016-05-26 MED ORDER — ALBUTEROL SULFATE HFA 108 (90 BASE) MCG/ACT IN AERS: 2.0000 | INHALATION_SPRAY | Freq: Four times a day (QID) | RESPIRATORY_TRACT | 3 refills | Status: DC | PRN

## 2016-05-26 NOTE — Patient Instructions (Signed)
Use 1/2 dose of albuterol nebulizer treatment twice a day  Take Zpak as instructed  Continue Budesonide twice a day

## 2016-05-26 NOTE — Progress Notes (Signed)
Date:  05/26/2016   Name:  Allison Snyder   DOB:  [birth year]   MRN:  [id number]   Chief Complaint: Cough (Wet cough/ hoarse. Wheezing bad yesterday and having difficulty breathing. White production. ) Cough  This is a new problem. The current episode started yesterday. The problem has been unchanged. The cough is non-productive. Associated symptoms include shortness of breath and wheezing. Pertinent negatives include no chest pain, chills or fever. The symptoms are aggravated by cold air and exercise. Her past medical history is significant for asthma.   Zio monitor placed yesterday is coming loose at the top and is blinking.  Patient is concerned that the Zio caused her SOB.  She is reassured that it did not.   Review of Systems  Constitutional: Negative for chills, fatigue and fever.  Respiratory: Positive for cough, shortness of breath and wheezing.   Cardiovascular: Negative for chest pain and palpitations.    Patient Active Problem List   Diagnosis Date Noted  . Transient cerebral ischemia 05/22/2016  . Chronic diastolic heart failure ([place]) 04/07/2015  . Essential hypertension 04/07/2015  . Varicose veins of both lower extremities 04/07/2015  . Breast mass, left 12/11/2014  . Bilateral renal cysts 09/23/2014  . Asthma, mild persistent 07/27/2014  . Aortic atherosclerosis ([place]) 07/27/2014  . Neoplasm of uncertain behavior of skin 07/27/2014  . Impaired renal function 07/27/2014  . Urinary system disease 07/27/2014    Prior to Admission medications   Medication Sig Start Date End Date Taking? Authorizing Provider  albuterol (PROAIR HFA) 108 (90 Base) MCG/ACT inhaler Inhale 2 puffs into the lungs every 6 (six) hours as needed for wheezing or shortness of breath.   Yes Historical Provider, MD  aspirin EC 81 MG tablet Take 1 tablet (81 mg total) by mouth daily. 05/21/16 05/21/17 Yes Harvest Dark, MD  b complex vitamins capsule Take 1 capsule by mouth daily.   Yes  Historical Provider, MD  budesonide (PULMICORT) 0.5 MG/2ML nebulizer solution Take 2 mLs (0.5 mg total) by nebulization 2 (two) times daily. 12/26/15  Yes Glean Hess, MD  calcium-vitamin D 250-100 MG-UNIT tablet Take 1 tablet by mouth 2 (two) times daily.   Yes Historical Provider, MD  Cholecalciferol (VITAMIN D3) 1000 units CAPS Take by mouth.   Yes Historical Provider, MD  Multiple Minerals-Vitamins (CALCIUM CITRATE PLUS/MAGNESIUM) TABS Take 2 tablets by mouth daily. 1,000-500 mg   Yes Historical Provider, MD  Omega-3 Fatty Acids (FISH OIL) 1000 MG CAPS Take 2,000 mg by mouth daily. Triple strength fish oil   Yes Historical Provider, MD  Pyridoxine HCl (VITAMIN B6 PO) Take by mouth.   Yes Historical Provider, MD  Sennosides-Docusate Sodium (EX-LAX GENTLE STRENGTH PO) Take 6 capsules by mouth daily.   Yes Historical Provider, MD  vitamin A 10000 UNIT capsule Take 10,000 Units by mouth daily. Reported on 04/07/2015   Yes Historical Provider, MD  vitamin C (ASCORBIC ACID) 500 MG tablet Take 500 mg by mouth daily.   Yes Historical Provider, MD  vitamin E 400 UNIT capsule Take 1,200 Units by mouth daily.   Yes Historical Provider, MD    Allergies  Allergen Reactions  . Levaquin [Levofloxacin] Shortness Of Breath  . Prednisone Shortness Of Breath  . Albuterol Anxiety    Past Surgical History:  Procedure Laterality Date  . ABDOMINAL HYSTERECTOMY    . APPENDECTOMY    . TUBAL LIGATION      Social History  Substance Use Topics  . Smoking  status: Never Smoker  . Smokeless tobacco: Never Used  . Alcohol use No     Medication list has been reviewed and updated.   Physical Exam  Constitutional: She is oriented to person, place, and time. She appears well-developed. No distress.  HENT:  Head: Normocephalic and atraumatic.  Neck: Normal range of motion. Neck supple. No thyromegaly present.  Cardiovascular: Normal rate, regular rhythm and normal heart sounds.   No murmur  heard. Pulmonary/Chest: Effort normal. No respiratory distress. She has wheezes.  Musculoskeletal: Normal range of motion.  Neurological: She is alert and oriented to person, place, and time.  Skin: Skin is warm and dry. No rash noted.  Psychiatric: She has a normal mood and affect. Her behavior is normal. Thought content normal.  Nursing note and vitals reviewed.  Paper tape used to attach Zio.  Patient urged to leave it on as long as possible. BP 98/64   Pulse 78   Ht 5\' 5"  (1.651 m)   Wt 166 lb (75.3 kg)   SpO2 94%   BMI 27.62 kg/m   Assessment and Plan: 1. Bronchitis - azithromycin (ZITHROMAX Z-PAK) 250 MG tablet; UAD  Dispense: 6 each; Refill: 0  2. Mild persistent asthma without complication Use 1/2 dose of albuterol nebs (can't tolerate the whole dose) twice a day - budesonide (PULMICORT) 0.5 MG/2ML nebulizer solution; Take 2 mLs (0.5 mg total) by nebulization 2 (two) times daily.  Dispense: 240 mL; Refill: 5 - albuterol (PROAIR HFA) 108 (90 Base) MCG/ACT inhaler; Inhale 2 puffs into the lungs every 6 (six) hours as needed for wheezing or shortness of breath.  Dispense: 18 g; Refill: 3   Meds ordered this encounter  Medications  . budesonide (PULMICORT) 0.5 MG/2ML nebulizer solution    Sig: Take 2 mLs (0.5 mg total) by nebulization 2 (two) times daily.    Dispense:  240 mL    Refill:  5  . albuterol (PROAIR HFA) 108 (90 Base) MCG/ACT inhaler    Sig: Inhale 2 puffs into the lungs every 6 (six) hours as needed for wheezing or shortness of breath.    Dispense:  18 g    Refill:  3  . azithromycin (ZITHROMAX Z-PAK) 250 MG tablet    Sig: UAD    Dispense:  6 each    Refill:  0    Halina Maidens, MD [name]  05/26/2016

## 2016-05-26 NOTE — Telephone Encounter (Signed)
Patient called complaining Zio meds causing radiation and cough. Explained this is not possible and made appt for cough.

## 2016-05-28 ENCOUNTER — Other Ambulatory Visit: Payer: Self-pay | Admitting: Internal Medicine

## 2016-05-28 DIAGNOSIS — J40 Bronchitis, not specified as acute or chronic: Secondary | ICD-10-CM

## 2016-05-28 MED ORDER — AZITHROMYCIN 250 MG PO TABS: ORAL_TABLET | ORAL | 0 refills | Status: DC

## 2016-05-31 ENCOUNTER — Ambulatory Visit
Admission: RE | Admit: 2016-05-31 | Discharge: 2016-05-31 | Disposition: A | Discharge status: Home / Self Care | Payer: Medicare Other | Source: Ambulatory Visit | Attending: Internal Medicine | Admitting: Internal Medicine

## 2016-05-31 DIAGNOSIS — I6523 Occlusion and stenosis of bilateral carotid arteries: Secondary | ICD-10-CM | POA: Insufficient documentation

## 2016-05-31 DIAGNOSIS — G459 Transient cerebral ischemic attack, unspecified: Secondary | ICD-10-CM

## 2016-06-07 ENCOUNTER — Ambulatory Visit (INDEPENDENT_AMBULATORY_CARE_PROVIDER_SITE_OTHER): Payer: Medicare Other | Admitting: Internal Medicine

## 2016-06-07 ENCOUNTER — Encounter: Payer: Self-pay | Admitting: Internal Medicine

## 2016-06-07 VITALS — BP 94/68 | HR 74 | Resp 14 | Ht 65.0 in | Wt 166.0 lb

## 2016-06-07 DIAGNOSIS — J4530 Mild persistent asthma, uncomplicated: Secondary | ICD-10-CM

## 2016-06-07 DIAGNOSIS — R42 Dizziness and giddiness: Secondary | ICD-10-CM | POA: Diagnosis not present

## 2016-06-07 MED ORDER — MECLIZINE HCL 12.5 MG PO TABS: 12.5000 mg | ORAL_TABLET | Freq: Two times a day (BID) | ORAL | 0 refills | Status: DC | PRN

## 2016-06-07 NOTE — Progress Notes (Signed)
Date:  06/07/2016   Name:  Allison Snyder   DOB:  1930/02/24   MRN:  [id number]   Chief Complaint: Dizziness (Feels dizzy when standing. Nausea has started along with dizziness. The dizziness is continuous problem. Pt has never seen ENT doctor before, and would be willing to go if sent. ) Two days ago she cleaned her house from top to bottom and felt fine.  Yesterday awoke feeling dizzy with nausea.  Sx are worse when she stands.  She has not vomited, has not passed out.  When sitting feels fairly normal. Eating very little since yesterday. She is unable to give me a good history on her fluid intake but it sounds minimal.   Review of Systems  Constitutional: Negative for chills, diaphoresis and fever.  HENT: Negative for ear pain, sinus pressure and trouble swallowing.   Eyes: Negative for visual disturbance.  Respiratory: Negative for cough, chest tightness, shortness of breath and wheezing.   Cardiovascular: Negative for chest pain and palpitations.  Gastrointestinal: Positive for nausea. Negative for abdominal pain and vomiting.  Musculoskeletal: Negative for arthralgias.  Neurological: Positive for dizziness and light-headedness. Negative for seizures, syncope and numbness.  Psychiatric/Behavioral: Negative for sleep disturbance.    Patient Active Problem List   Diagnosis Date Noted  . Transient cerebral ischemia 05/22/2016  . Chronic diastolic heart failure ([place]) 04/07/2015  . Essential hypertension 04/07/2015  . Varicose veins of both lower extremities 04/07/2015  . Breast mass, left 12/11/2014  . Bilateral renal cysts 09/23/2014  . Asthma, mild persistent 07/27/2014  . Aortic atherosclerosis ([place]) 07/27/2014  . Neoplasm of uncertain behavior of skin 07/27/2014  . Impaired renal function 07/27/2014  . Urinary system disease 07/27/2014    Prior to Admission medications   Medication Sig Start Date End Date Taking? Authorizing Provider  albuterol (PROAIR HFA) 108 (90  Base) MCG/ACT inhaler Inhale 2 puffs into the lungs every 6 (six) hours as needed for wheezing or shortness of breath. 05/26/16  Yes Glean Hess, MD  aspirin EC 81 MG tablet Take 1 tablet (81 mg total) by mouth daily. 05/21/16 05/21/17 Yes Harvest Dark, MD  azithromycin (ZITHROMAX Z-PAK) 250 MG tablet UAD 05/28/16  Yes Glean Hess, MD  b complex vitamins capsule Take 1 capsule by mouth daily.   Yes Historical Provider, MD  budesonide (PULMICORT) 0.5 MG/2ML nebulizer solution Take 2 mLs (0.5 mg total) by nebulization 2 (two) times daily. 05/26/16  Yes Glean Hess, MD  calcium-vitamin D 250-100 MG-UNIT tablet Take 1 tablet by mouth 2 (two) times daily.   Yes Historical Provider, MD  Cholecalciferol (VITAMIN D3) 1000 units CAPS Take by mouth.   Yes Historical Provider, MD  Multiple Minerals-Vitamins (CALCIUM CITRATE PLUS/MAGNESIUM) TABS Take 2 tablets by mouth daily. 1,000-500 mg   Yes Historical Provider, MD  Omega-3 Fatty Acids (FISH OIL) 1000 MG CAPS Take 2,000 mg by mouth daily. Triple strength fish oil   Yes Historical Provider, MD  Pyridoxine HCl (VITAMIN B6 PO) Take by mouth.   Yes Historical Provider, MD  Sennosides-Docusate Sodium (EX-LAX GENTLE STRENGTH PO) Take 6 capsules by mouth daily.   Yes Historical Provider, MD  vitamin A 10000 UNIT capsule Take 10,000 Units by mouth daily. Reported on 04/07/2015   Yes Historical Provider, MD  vitamin C (ASCORBIC ACID) 500 MG tablet Take 500 mg by mouth daily.   Yes Historical Provider, MD  vitamin E 400 UNIT capsule Take 1,200 Units by mouth daily.   Yes  Historical Provider, MD    Allergies  Allergen Reactions  . Levaquin [Levofloxacin] Shortness Of Breath  . Prednisone Shortness Of Breath  . Albuterol Anxiety    Past Surgical History:  Procedure Laterality Date  . ABDOMINAL HYSTERECTOMY    . APPENDECTOMY    . TUBAL LIGATION      Social History  Substance Use Topics  . Smoking status: Never Smoker  . Smokeless tobacco:  Never Used  . Alcohol use No     Medication list has been reviewed and updated.   Physical Exam  Constitutional: She is oriented to person, place, and time. She appears well-developed.  Non-toxic appearance. No distress.  HENT:  Head: Normocephalic and atraumatic.  Right Ear: Tympanic membrane and ear canal normal.  Left Ear: Tympanic membrane and ear canal normal.  Nose: Right sinus exhibits no maxillary sinus tenderness. Left sinus exhibits no maxillary sinus tenderness.  Mouth/Throat: No posterior oropharyngeal erythema.  Eyes: Conjunctivae and EOM are normal. Pupils are equal, round, and reactive to light.  Neck: Normal range of motion. No thyromegaly present.  Cardiovascular: Normal rate, regular rhythm and normal heart sounds.   Pulmonary/Chest: Effort normal and breath sounds normal. No respiratory distress. She has no wheezes.  Abdominal: Soft. Normal appearance and bowel sounds are normal.  Musculoskeletal: Normal range of motion.  Neurological: She is alert and oriented to person, place, and time.  Skin: Skin is warm and dry. No rash noted.  Psychiatric: Her speech is normal and behavior is normal. Thought content normal. Her affect is blunt.  Nursing note and vitals reviewed.   BP 94/68   Pulse 74   Resp 14   Ht 5\' 5"  (1.651 m)   Wt 166 lb (75.3 kg) Comment: in wheel chair today.  SpO2 92%   BMI 27.62 kg/m   Assessment and Plan: 1. Dizziness Borderline low BP - encouraged increased fluids Call in several days if persistent sx - meclizine (ANTIVERT) 12.5 MG tablet; Take 1 tablet (12.5 mg total) by mouth 2 (two) times daily as needed for dizziness.  Dispense: 10 tablet; Refill: 0  2. Mild persistent asthma without complication Controlled - just completed Zpak   Meds ordered this encounter  Medications  . meclizine (ANTIVERT) 12.5 MG tablet    Sig: Take 1 tablet (12.5 mg total) by mouth 2 (two) times daily as needed for dizziness.    Dispense:  10 tablet     Refill:  0    Halina Maidens, MD [facility]  06/07/2016

## 2016-06-08 DIAGNOSIS — R079 Chest pain, unspecified: Secondary | ICD-10-CM | POA: Diagnosis not present

## 2016-06-15 ENCOUNTER — Telehealth: Payer: Self-pay

## 2016-06-15 NOTE — Telephone Encounter (Signed)
Received pt results for ZIO patch. Called pt several times within a 2 day period to tell her of results- no answer or call back and left VM to call office back. Awaiting pt call for results.

## 2016-07-05 ENCOUNTER — Emergency Department: Payer: Medicare Other

## 2016-07-05 ENCOUNTER — Encounter: Payer: Self-pay | Admitting: Emergency Medicine

## 2016-07-05 ENCOUNTER — Emergency Department
Admission: EM | Admit: 2016-07-05 | Discharge: 2016-07-06 | Disposition: A | Discharge status: Home / Self Care | Payer: Medicare Other | Attending: Emergency Medicine | Admitting: Emergency Medicine

## 2016-07-05 DIAGNOSIS — I509 Heart failure, unspecified: Secondary | ICD-10-CM | POA: Insufficient documentation

## 2016-07-05 DIAGNOSIS — R05 Cough: Secondary | ICD-10-CM | POA: Diagnosis not present

## 2016-07-05 DIAGNOSIS — J45909 Unspecified asthma, uncomplicated: Secondary | ICD-10-CM | POA: Diagnosis not present

## 2016-07-05 DIAGNOSIS — R0602 Shortness of breath: Secondary | ICD-10-CM | POA: Insufficient documentation

## 2016-07-05 DIAGNOSIS — Z5321 Procedure and treatment not carried out due to patient leaving prior to being seen by health care provider: Secondary | ICD-10-CM | POA: Insufficient documentation

## 2016-07-05 DIAGNOSIS — Z79899 Other long term (current) drug therapy: Secondary | ICD-10-CM | POA: Insufficient documentation

## 2016-07-05 LAB — CBC WITH DIFFERENTIAL/PLATELET
Basophils Absolute: 0 10*3/uL (ref 0–0.1)
Basophils Relative: 1 %
Eosinophils Absolute: 0.1 10*3/uL (ref 0–0.7)
Eosinophils Relative: 2 %
HCT: 42.2 % (ref 35.0–47.0)
Hemoglobin: 14.3 g/dL (ref 12.0–16.0)
LYMPHS ABS: 2.3 10*3/uL (ref 1.0–3.6)
LYMPHS PCT: 40 %
MCH: 30.5 pg (ref 26.0–34.0)
MCHC: 34 g/dL (ref 32.0–36.0)
MCV: 89.7 fL (ref 80.0–100.0)
MONO ABS: 0.5 10*3/uL (ref 0.2–0.9)
MONOS PCT: 9 %
NEUTROS ABS: 2.8 10*3/uL (ref 1.4–6.5)
Neutrophils Relative %: 48 %
PLATELETS: 167 10*3/uL (ref 150–440)
RBC: 4.7 MIL/uL (ref 3.80–5.20)
RDW: 13.6 % (ref 11.5–14.5)
WBC: 5.7 10*3/uL (ref 3.6–11.0)

## 2016-07-05 NOTE — ED Triage Notes (Signed)
Pt presents to ED with c/o sob for the past 2 hours with wet sounding cough. Pt states she did a breathing tx at home with no relief.

## 2016-07-06 ENCOUNTER — Telehealth: Payer: Self-pay | Admitting: Emergency Medicine

## 2016-07-06 LAB — BASIC METABOLIC PANEL
Anion gap: 6 (ref 5–15)
BUN: 16 mg/dL (ref 6–20)
CALCIUM: 9.2 mg/dL (ref 8.9–10.3)
CO2: 32 mmol/L (ref 22–32)
CREATININE: 1.03 mg/dL — AB (ref 0.44–1.00)
Chloride: 102 mmol/L (ref 101–111)
GFR calc non Af Amer: 49 mL/min — ABNORMAL LOW (ref >60)
GFR, EST AFRICAN AMERICAN: 56 mL/min — AB (ref >60)
Glucose, Bld: 109 mg/dL — ABNORMAL HIGH (ref 65–99)
Potassium: 3.9 mmol/L (ref 3.5–5.1)
SODIUM: 140 mmol/L (ref 135–145)

## 2016-07-06 LAB — TROPONIN I

## 2016-07-06 LAB — BRAIN NATRIURETIC PEPTIDE: B NATRIURETIC PEPTIDE 5: 116 pg/mL — AB (ref 0.0–100.0)

## 2016-07-06 NOTE — Telephone Encounter (Signed)
Called patient due to lwot to inquire about condition and follow up plans. Left message.   

## 2016-09-13 ENCOUNTER — Ambulatory Visit: Payer: Medicare Other | Admitting: Internal Medicine

## 2016-09-15 ENCOUNTER — Ambulatory Visit: Payer: Medicare Other | Admitting: Internal Medicine

## 2016-09-17 ENCOUNTER — Ambulatory Visit (INDEPENDENT_AMBULATORY_CARE_PROVIDER_SITE_OTHER): Payer: Medicare Other | Admitting: Internal Medicine

## 2016-09-17 ENCOUNTER — Encounter: Payer: Self-pay | Admitting: Internal Medicine

## 2016-09-17 VITALS — BP 122/70 | HR 75 | Ht 65.0 in | Wt 162.8 lb

## 2016-09-17 DIAGNOSIS — F39 Unspecified mood [affective] disorder: Secondary | ICD-10-CM | POA: Diagnosis not present

## 2016-09-17 DIAGNOSIS — R739 Hyperglycemia, unspecified: Secondary | ICD-10-CM | POA: Diagnosis not present

## 2016-09-17 DIAGNOSIS — R42 Dizziness and giddiness: Secondary | ICD-10-CM | POA: Diagnosis not present

## 2016-09-17 DIAGNOSIS — I5032 Chronic diastolic (congestive) heart failure: Secondary | ICD-10-CM | POA: Diagnosis not present

## 2016-09-17 DIAGNOSIS — I700 Atherosclerosis of aorta: Secondary | ICD-10-CM | POA: Diagnosis not present

## 2016-09-17 DIAGNOSIS — J4530 Mild persistent asthma, uncomplicated: Secondary | ICD-10-CM

## 2016-09-17 NOTE — Progress Notes (Signed)
Date:  09/17/2016   Name:  Allison Snyder   DOB:  08/30/1929   MRN:  [id number]   Chief Complaint: Dizziness (Pt was prescribed ANTIVERT for dizziness from Dr. Army Melia- pt did not take this medication. ) Dizziness  This is a chronic problem. The current episode started more than 1 year ago. The problem occurs intermittently. Associated symptoms include coughing. Pertinent negatives include no chest pain, chills, diaphoresis, fatigue, fever, myalgias, vomiting or weakness. The symptoms are aggravated by standing. She has tried nothing for the symptoms.   CHF - noted 2016 on admission for SOB and pulmonary edema. ECHO 02/2015 - normal EF without CHF.  She has never had cardiology evaluation outside the hospital.  She did have ZIO patch in April that showed SR and a few short (<5 beats) of tachycardia done to rule out arrhythmia as cause of persistent lightheadedness.  Atherosclerosis - noted on CXR in 2015.  Patient has not been prescribed statin medication due to age and patient choice.  Asthma - states that shortness of breath is stable.  No recent URI sx, wheezing or fever/chills. She uses pulmicort by nebulizer twice a day.   Review of Systems  Constitutional: Negative for chills, diaphoresis, fatigue and fever.  Eyes: Negative for visual disturbance.  Respiratory: Positive for cough. Negative for chest tightness, shortness of breath and wheezing.   Cardiovascular: Negative for chest pain, palpitations and leg swelling.  Gastrointestinal: Negative for vomiting.  Endocrine: Negative for polydipsia.  Musculoskeletal: Negative for myalgias.  Neurological: Positive for dizziness. Negative for syncope and weakness.  Psychiatric/Behavioral: Positive for dysphoric mood. Negative for sleep disturbance. The patient is not nervous/anxious.     Patient Active Problem List   Diagnosis Date Noted  . Transient cerebral ischemia 05/22/2016  . Chronic diastolic heart failure ([place])  04/07/2015  . Essential hypertension 04/07/2015  . Varicose veins of both lower extremities 04/07/2015  . Breast mass, left 12/11/2014  . Bilateral renal cysts 09/23/2014  . Asthma, mild persistent 07/27/2014  . Aortic atherosclerosis ([place]) 07/27/2014  . Neoplasm of uncertain behavior of skin 07/27/2014  . Impaired renal function 07/27/2014  . Urinary system disease 07/27/2014    Prior to Admission medications   Medication Sig Start Date End Date Taking? Authorizing Provider  aspirin EC 81 MG tablet Take 1 tablet (81 mg total) by mouth daily. 05/21/16 05/21/17 Yes PaduchowskiLennette Bihari, MD  b complex vitamins capsule Take 1 capsule by mouth daily.   Yes [provider]  budesonide (PULMICORT) 0.5 MG/2ML nebulizer solution Take 2 mLs (0.5 mg total) by nebulization 2 (two) times daily. 05/26/16  Yes Glean Hess, MD  calcium-vitamin D 250-100 MG-UNIT tablet Take 1 tablet by mouth 2 (two) times daily.   Yes [provider]  Cholecalciferol (VITAMIN D3) 1000 units CAPS Take by mouth.   Yes [provider]  Multiple Minerals-Vitamins (CALCIUM CITRATE PLUS/MAGNESIUM) TABS Take 2 tablets by mouth daily. 1,000-500 mg   Yes [provider]  Omega-3 Fatty Acids (FISH OIL) 1000 MG CAPS Take 2,000 mg by mouth daily. Triple strength fish oil   Yes [provider]  Pyridoxine HCl (VITAMIN B6 PO) Take by mouth.   Yes [provider]  Sennosides-Docusate Sodium (EX-LAX GENTLE STRENGTH PO) Take 6 capsules by mouth daily.   Yes [provider]  vitamin A 10000 UNIT capsule Take 10,000 Units by mouth daily. Reported on 04/07/2015   Yes [provider]  vitamin C (ASCORBIC ACID) 500 MG  tablet Take 500 mg by mouth daily.   Yes [provider]  vitamin E 400 UNIT capsule Take 1,200 Units by mouth daily.   Yes [provider]    Allergies  Allergen Reactions  . Levaquin [Levofloxacin] Shortness Of Breath  . Prednisone  Shortness Of Breath  . Albuterol Anxiety    Past Surgical History:  Procedure Laterality Date  . ABDOMINAL HYSTERECTOMY    . APPENDECTOMY    . TUBAL LIGATION      Social History  Substance Use Topics  . Smoking status: Never Smoker  . Smokeless tobacco: Never Used  . Alcohol use No   Depression screen [facility] 2/9 12/26/2015 04/28/2015 04/07/2015 02/20/2015 09/24/2014  Decreased Interest 0 0 0 0 0  Down, Depressed, Hopeless 0 1 1 0 0  PHQ - 2 Score 0 1 1 0 0     Medication list has been reviewed and updated.   Physical Exam  Constitutional: She is oriented to person, place, and time. She appears well-developed. No distress.  HENT:  Head: Normocephalic and atraumatic.  Neck: Normal range of motion. Neck supple. No thyromegaly present.  Cardiovascular: Normal rate, regular rhythm and normal heart sounds.   Pulmonary/Chest: Effort normal and breath sounds normal. No respiratory distress. She has no wheezes.  Musculoskeletal: She exhibits no edema or tenderness.  Neurological: She is alert and oriented to person, place, and time.  Skin: Skin is warm and dry. No rash noted.  Psychiatric: She has a normal mood and affect. Her behavior is normal. Thought content normal.  Nursing note and vitals reviewed.   BP 122/70   Pulse 75   Ht 5\' 5"  (1.651 m)   Wt 162 lb 12.8 oz (73.8 kg)   SpO2 97%   BMI 27.09 kg/m   Assessment and Plan: 1. Episodic lightheadedness Likely due to mild orthostatic BP changes Encourage caution upon standing Adequate fluid intake  2. Chronic diastolic heart failure (HCC) Appears to be resolved - Comprehensive metabolic panel - TSH  3. Mild persistent asthma without complication Continue pulmicort nebs - TSH  4. Hyperglycemia Rule out early DM - Hemoglobin A1c  5. Aortic atherosclerosis (HCC) No statin therapy is warranted due to age and patient choice   No orders of the defined types were placed in this encounter.   Halina Maidens,  MD [facility]  09/17/2016

## 2016-09-17 NOTE — Patient Instructions (Signed)
Drink more water each day.

## 2016-09-18 LAB — COMPREHENSIVE METABOLIC PANEL
A/G RATIO: 1.6 (ref 1.2–2.2)
ALT: 9 IU/L (ref 0–32)
AST: 20 IU/L (ref 0–40)
Albumin: 4.3 g/dL (ref 3.5–4.7)
Alkaline Phosphatase: 78 IU/L (ref 39–117)
BUN/Creatinine Ratio: 27 (ref 12–28)
BUN: 23 mg/dL (ref 8–27)
Bilirubin Total: 0.4 mg/dL (ref 0.0–1.2)
CALCIUM: 9.5 mg/dL (ref 8.7–10.3)
CO2: 29 mmol/L (ref 20–29)
Chloride: 99 mmol/L (ref 96–106)
Creatinine, Ser: 0.84 mg/dL (ref 0.57–1.00)
GFR, EST AFRICAN AMERICAN: 74 mL/min/{1.73_m2} (ref >59)
GFR, EST NON AFRICAN AMERICAN: 64 mL/min/{1.73_m2} (ref >59)
GLOBULIN, TOTAL: 2.7 g/dL (ref 1.5–4.5)
Glucose: 69 mg/dL (ref 65–99)
POTASSIUM: 4.5 mmol/L (ref 3.5–5.2)
SODIUM: 143 mmol/L (ref 134–144)
TOTAL PROTEIN: 7 g/dL (ref 6.0–8.5)

## 2016-09-18 LAB — TSH: TSH: 1.5 u[IU]/mL (ref 0.450–4.500)

## 2016-09-18 LAB — HEMOGLOBIN A1C
Est. average glucose Bld gHb Est-mCnc: 108 mg/dL
Hgb A1c MFr Bld: 5.4 % (ref 4.8–5.6)

## 2016-11-02 ENCOUNTER — Encounter: Payer: Self-pay | Admitting: Emergency Medicine

## 2016-11-02 ENCOUNTER — Emergency Department: Payer: Medicare Other

## 2016-11-02 ENCOUNTER — Emergency Department
Admission: EM | Admit: 2016-11-02 | Discharge: 2016-11-02 | Disposition: A | Discharge status: Home / Self Care | Payer: Medicare Other | Attending: Emergency Medicine | Admitting: Emergency Medicine

## 2016-11-02 DIAGNOSIS — J9801 Acute bronchospasm: Secondary | ICD-10-CM

## 2016-11-02 DIAGNOSIS — Z79899 Other long term (current) drug therapy: Secondary | ICD-10-CM | POA: Diagnosis not present

## 2016-11-02 DIAGNOSIS — Z7982 Long term (current) use of aspirin: Secondary | ICD-10-CM | POA: Insufficient documentation

## 2016-11-02 DIAGNOSIS — R05 Cough: Secondary | ICD-10-CM | POA: Diagnosis not present

## 2016-11-02 DIAGNOSIS — R9431 Abnormal electrocardiogram [ECG] [EKG]: Secondary | ICD-10-CM | POA: Diagnosis not present

## 2016-11-02 DIAGNOSIS — I509 Heart failure, unspecified: Secondary | ICD-10-CM | POA: Insufficient documentation

## 2016-11-02 DIAGNOSIS — R0602 Shortness of breath: Secondary | ICD-10-CM | POA: Diagnosis not present

## 2016-11-02 LAB — COMPREHENSIVE METABOLIC PANEL
ALBUMIN: 3.6 g/dL (ref 3.5–5.0)
ALK PHOS: 66 U/L (ref 38–126)
ALT: 11 U/L — AB (ref 14–54)
AST: 19 U/L (ref 15–41)
Anion gap: 9 (ref 5–15)
BILIRUBIN TOTAL: 1.1 mg/dL (ref 0.3–1.2)
BUN: 11 mg/dL (ref 6–20)
CALCIUM: 9 mg/dL (ref 8.9–10.3)
CO2: 31 mmol/L (ref 22–32)
CREATININE: 0.8 mg/dL (ref 0.44–1.00)
Chloride: 100 mmol/L — ABNORMAL LOW (ref 101–111)
GFR calc non Af Amer: >60 mL/min (ref >60)
GLUCOSE: 122 mg/dL — AB (ref 65–99)
Potassium: 3.7 mmol/L (ref 3.5–5.1)
SODIUM: 140 mmol/L (ref 135–145)
TOTAL PROTEIN: 7.3 g/dL (ref 6.5–8.1)

## 2016-11-02 LAB — CBC
HEMATOCRIT: 40.8 % (ref 35.0–47.0)
HEMOGLOBIN: 14.3 g/dL (ref 12.0–16.0)
MCH: 31 pg (ref 26.0–34.0)
MCHC: 35 g/dL (ref 32.0–36.0)
MCV: 88.6 fL (ref 80.0–100.0)
Platelets: 222 10*3/uL (ref 150–440)
RBC: 4.6 MIL/uL (ref 3.80–5.20)
RDW: 12.5 % (ref 11.5–14.5)
WBC: 9.1 10*3/uL (ref 3.6–11.0)

## 2016-11-02 LAB — TROPONIN I: Troponin I: 0.03 ng/mL (ref <0.03)

## 2016-11-02 MED ORDER — IPRATROPIUM-ALBUTEROL 0.5-2.5 (3) MG/3ML IN SOLN
3.0000 mL | Freq: Once | RESPIRATORY_TRACT | Status: AC
  Administered 2016-11-02: 3 mL via RESPIRATORY_TRACT
  Filled 2016-11-02: qty 3

## 2016-11-02 MED ORDER — PREDNISONE 50 MG PO TABS: 50.0000 mg | ORAL_TABLET | Freq: Every day | ORAL | 0 refills | Status: DC

## 2016-11-02 NOTE — ED Triage Notes (Signed)
Pt c/o feeling SHOB for 2-3 days. Has been seen by PCP and given nebulizer medication but not getting better.  Has had cough per pt. Denies pain.  Unlabored currently. VSS

## 2016-11-02 NOTE — ED Provider Notes (Signed)
[facility] Emergency Department Provider Note   ____________________________________________    I have reviewed the triage vital signs and the nursing notes.   HISTORY  Chief Complaint Shortness of Breath     HPI Allison Snyder is a 83 y.o. female who presents with complaints of shortness of breath. Patient reports mild shortness of breath over the last 2-3 days. She saw her PCP who started her on a nebulizer she reports this helped somewhat. She describes that she feels her asthma is responsible for her shortness of breath. She denies smoking history. No fevers or chills. Mild dry cough. No recent travel. No calf pain or swelling.   Past Medical History:  Diagnosis Date  . Asthma   . CHF (congestive heart failure) ([place])   . Gallbladder & bile duct stone with obstruction    issues off and on  . H/O tubal ligation   . History of appendectomy   . Hypoglycemic syndrome     Patient Active Problem List   Diagnosis Date Noted  . Hyperglycemia 09/17/2016  . Episodic lightheadedness 09/17/2016  . Transient cerebral ischemia 05/22/2016  . Varicose veins of both lower extremities 04/07/2015  . Breast mass, left 12/11/2014  . Bilateral renal cysts 09/23/2014  . Asthma, mild persistent 07/27/2014  . Aortic atherosclerosis ([place]) 07/27/2014  . Neoplasm of uncertain behavior of skin 07/27/2014  . Impaired renal function 07/27/2014    Past Surgical History:  Procedure Laterality Date  . ABDOMINAL HYSTERECTOMY    . APPENDECTOMY    . TUBAL LIGATION      Prior to Admission medications   Medication Sig Start Date End Date Taking? Authorizing Provider  aspirin EC 81 MG tablet Take 1 tablet (81 mg total) by mouth daily. 05/21/16 05/21/17 Yes PaduchowskiLennette Bihari, MD  b complex vitamins capsule Take 1 capsule by mouth daily.   Yes [provider]  budesonide (PULMICORT) 0.5 MG/2ML nebulizer solution Take 2 mLs (0.5 mg total) by nebulization 2  (two) times daily. 05/26/16  Yes Glean Hess, MD  calcium-vitamin D 250-100 MG-UNIT tablet Take 1 tablet by mouth 2 (two) times daily.   Yes [provider]  Cholecalciferol (VITAMIN D3) 1000 units CAPS Take by mouth.   Yes [provider]  Multiple Minerals-Vitamins (CALCIUM CITRATE PLUS/MAGNESIUM) TABS Take 2 tablets by mouth daily. 1,000-500 mg   Yes [provider]  Omega-3 Fatty Acids (FISH OIL) 1000 MG CAPS Take 2,000 mg by mouth daily. Triple strength fish oil   Yes [provider]  Pyridoxine HCl (VITAMIN B6 PO) Take by mouth.   Yes [provider]  Sennosides-Docusate Sodium (EX-LAX GENTLE STRENGTH PO) Take 6 capsules by mouth daily.   Yes [provider]  vitamin A 10000 UNIT capsule Take 10,000 Units by mouth daily. Reported on 04/07/2015   Yes [provider]  vitamin C (ASCORBIC ACID) 500 MG tablet Take 500 mg by mouth daily.   Yes [provider]  vitamin E 400 UNIT capsule Take 1,200 Units by mouth daily.   Yes [provider]  predniSONE (DELTASONE) 50 MG tablet Take 1 tablet (50 mg total) by mouth daily with breakfast. 11/02/16   Lavonia Drafts, MD     Allergies Levaquin [levofloxacin]; Prednisone; and Albuterol  Family History  Problem Relation Age of Onset  . Colon cancer Mother   . Diabetes Sister     Social History Social History  Substance Use Topics  . Smoking status: Never Smoker  .  Smokeless tobacco: Never Used  . Alcohol use No    Review of Systems  Constitutional: No fever/chills Eyes: No visual changes.  ENT: No sore throat. Cardiovascular: Denies chest pain. Respiratory: As above Gastrointestinal: No abdominal pain.  No nausea, no vomiting.   Genitourinary: Negative for dysuria. Musculoskeletal: Negative for back pain. Skin: Negative for rash. Neurological: Negative for headaches    ____________________________________________   PHYSICAL EXAM:  VITAL  SIGNS: ED Triage Vitals  Enc Vitals Group     BP 11/02/16 0926 (!) 112/53     Pulse Rate 11/02/16 0926 81     Resp 11/02/16 0926 18     Temp 11/02/16 0926 98.4 F (36.9 C)     Temp Source 11/02/16 0926 Oral     SpO2 11/02/16 0926 95 %     Weight 11/02/16 0924 68 kg (150 lb)     Height 11/02/16 0924 1.524 m (5')     Head Circumference --      Peak Flow --      Pain Score --      Pain Loc --      Pain Edu? --      Excl. in [place]? --     Constitutional: Alert and oriented. No acute distress. Pleasant and interactive Eyes: Conjunctivae are normal.   Nose: No congestion/rhinnorhea. Mouth/Throat: Mucous membranes are moist.    Cardiovascular: Normal rate, regular rhythm. Grossly normal heart sounds.  Good peripheral circulation. Respiratory: Normal respiratory effort.  No retractions. Mild inspiratory wheezing Gastrointestinal: Soft and nontender. No distention.  No CVA tenderness. Genitourinary: deferred Musculoskeletal: No lower extremity tenderness nor edema.  Warm and well perfused Neurologic:  Normal speech and language. No gross focal neurologic deficits are appreciated.  Skin:  Skin is warm, dry and intact. No rash noted. Psychiatric: Mood and affect are normal. Speech and behavior are normal.  ____________________________________________   LABS (all labs ordered are listed, but only abnormal results are displayed)  Labs Reviewed  COMPREHENSIVE METABOLIC PANEL - Abnormal; Notable for the following:       Result Value   Chloride 100 (*)    Glucose, Bld 122 (*)    ALT 11 (*)    All other components within normal limits  TROPONIN I - Abnormal; Notable for the following:    Troponin I 0.03 (*)    All other components within normal limits  CBC   ____________________________________________  EKG  ED ECG REPORT I, Lavonia Drafts, the attending physician, personally viewed and interpreted this ECG.  Date: 11/02/2016 EKG Time: 9:20 AM Rate: 84 Rhythm: normal sinus  rhythm QRS Axis: normal Intervals: Right bundle branch block ST/T Wave abnormalities: normal   ____________________________________________  RADIOLOGY  cxr nad ____________________________________________   PROCEDURES  Procedure(s) performed: No    Critical Care performed: No ____________________________________________   INITIAL IMPRESSION / ASSESSMENT AND PLAN / ED COURSE  Pertinent labs & imaging results that were available during my care of the patient were reviewed by me and considered in my medical decision making (see chart for details).  Patient well-appearing and in no acute distress. She has mild respiratory wheezing, we will check labs, chest x-ray give DuoNeb in the department and reevaluate  After nebs patient feels significantly better. I recommended prednisone, she states she doesn't have a true allergy and will take a rx    ____________________________________________   FINAL CLINICAL IMPRESSION(S) / ED DIAGNOSES  Final diagnoses:  Bronchospasm, acute      NEW MEDICATIONS STARTED DURING THIS  VISIT:  New Prescriptions   PREDNISONE (DELTASONE) 50 MG TABLET    Take 1 tablet (50 mg total) by mouth daily with breakfast.     Note:  This document was prepared using Dragon voice recognition software and may include unintentional dictation errors.    Lavonia Drafts, MD 11/02/16 [phone]

## 2016-11-02 NOTE — ED Notes (Signed)
Pt verbalized understanding of discharge instructions. NAD at this time. 

## 2016-11-02 NOTE — ED Notes (Signed)
Breathing treatment finished and pt states breathing is easier.  Pt sats 100% at this time.  Mild expiratory wheezing noted bilat still.

## 2016-11-02 NOTE — ED Notes (Signed)
Pt up to bathroom without assistance.  Tolerated well.  sats 93%

## 2016-12-28 DIAGNOSIS — Z23 Encounter for immunization: Secondary | ICD-10-CM | POA: Diagnosis not present

## 2017-02-23 ENCOUNTER — Encounter: Payer: Self-pay | Admitting: Internal Medicine

## 2017-02-23 ENCOUNTER — Ambulatory Visit (INDEPENDENT_AMBULATORY_CARE_PROVIDER_SITE_OTHER): Payer: Medicare Other | Admitting: Internal Medicine

## 2017-02-23 VITALS — BP 112/78 | HR 73 | Temp 97.8°F | Ht 60.0 in | Wt 150.0 lb

## 2017-02-23 DIAGNOSIS — R4189 Other symptoms and signs involving cognitive functions and awareness: Secondary | ICD-10-CM

## 2017-02-23 DIAGNOSIS — J069 Acute upper respiratory infection, unspecified: Secondary | ICD-10-CM | POA: Diagnosis not present

## 2017-02-23 NOTE — Patient Instructions (Addendum)
Upper Respiratory Infection, Adult Most upper respiratory infections (URIs) are caused by a virus. A URI affects the nose, throat, and upper air passages. The most common type of URI is often called "the common cold." Follow these instructions at home:  Take medicines only as told by your doctor.  Gargle warm saltwater or take cough drops to comfort your throat as told by your doctor.  Use a warm mist humidifier or inhale steam from a shower to increase air moisture. This may make it easier to breathe.  Drink enough fluid to keep your pee (urine) clear or pale yellow.  Eat soups and other clear broths.  Have a healthy diet.  Rest as needed.  Go back to work when your fever is gone or your doctor says it is okay. ? You may need to stay home longer to avoid giving your URI to others. ? You can also wear a face mask and wash your hands often to prevent spread of the virus.  Use your inhaler more if you have asthma.  Do not use any tobacco products, including cigarettes, chewing tobacco, or electronic cigarettes. If you need help quitting, ask your doctor. Contact a doctor if:  You are getting worse, not better.  Your symptoms are not helped by medicine.  You have chills.  You are getting more short of breath.  You have brown or red mucus.  You have yellow or brown discharge from your nose.  You have pain in your face, especially when you bend forward.  You have a fever.  You have puffy (swollen) neck glands.  You have pain while swallowing.  You have white areas in the back of your throat. Get help right away if:  You have very bad or constant: ? Headache. ? Ear pain. ? Pain in your forehead, behind your eyes, and over your cheekbones (sinus pain). ? Chest pain.  You have long-lasting (chronic) lung disease and any of the following: ? Wheezing. ? Long-lasting cough. ? Coughing up blood. ? A change in your usual mucus.  You have a stiff neck.  You have  changes in your: ? Vision. ? Hearing. ? Thinking. ? Mood. This information is not intended to replace advice given to you by your health care provider. Make sure you discuss any questions you have with your health care provider. Document Released: 08/11/2007 Document Revised: 10/26/2015 Document Reviewed: 05/30/2013 Elsevier Interactive Patient Education  2018 [place] on Pulmicort at [employer] in [place]

## 2017-02-23 NOTE — Progress Notes (Signed)
Date:  02/23/2017   Name:  Allison Snyder   DOB:  10-17-1929   MRN:  [id number]   Chief Complaint: Cough (Started yesterday. Coughing with mucous. Does not know the color. Sore throat. Nose stopped up. )  Cough  This is a new problem. The current episode started yesterday. The problem occurs every few minutes. Associated symptoms include nasal congestion. Pertinent negatives include no chest pain, chills, ear pain, fever, sore throat, shortness of breath or wheezing.   Memory changes - she has noticed gradual increase in memory issues.  She knows what words she wants to say but can speak them.  She thinks that her short term memory is worse as well. She is still driving - has gotten lost going anywhere.  She has not forgotten to pay any bills.   Review of Systems  Constitutional: Negative for chills and fever.  HENT: Negative for ear pain, sinus pressure, sinus pain, sore throat and trouble swallowing.   Respiratory: Positive for cough. Negative for chest tightness, shortness of breath and wheezing.   Cardiovascular: Negative for chest pain, palpitations and leg swelling.  Gastrointestinal: Negative for abdominal pain.  Psychiatric/Behavioral: Positive for confusion and decreased concentration. Negative for dysphoric mood. The patient is not nervous/anxious.     Patient Active Problem List   Diagnosis Date Noted  . Hyperglycemia 09/17/2016  . Episodic lightheadedness 09/17/2016  . Transient cerebral ischemia 05/22/2016  . Varicose veins of both lower extremities 04/07/2015  . Breast mass, left 12/11/2014  . Bilateral renal cysts 09/23/2014  . Asthma, mild persistent 07/27/2014  . Aortic atherosclerosis ([place]) 07/27/2014  . Neoplasm of uncertain behavior of skin 07/27/2014  . Impaired renal function 07/27/2014    Prior to Admission medications   Medication Sig Start Date End Date Taking? Authorizing Provider  aspirin EC 81 MG tablet Take 1 tablet (81 mg total) by mouth  daily. 05/21/16 05/21/17 Yes PaduchowskiLennette Bihari, MD  b complex vitamins capsule Take 1 capsule by mouth daily.   Yes [provider]  budesonide (PULMICORT) 0.5 MG/2ML nebulizer solution Take 2 mLs (0.5 mg total) by nebulization 2 (two) times daily. 05/26/16  Yes Glean Hess, MD  calcium-vitamin D 250-100 MG-UNIT tablet Take 1 tablet by mouth 2 (two) times daily.   Yes [provider]  Cholecalciferol (VITAMIN D3) 1000 units CAPS Take by mouth.   Yes [provider]  Multiple Minerals-Vitamins (CALCIUM CITRATE PLUS/MAGNESIUM) TABS Take 2 tablets by mouth daily. 1,000-500 mg   Yes [provider]  Omega-3 Fatty Acids (FISH OIL) 1000 MG CAPS Take 2,000 mg by mouth daily. Triple strength fish oil   Yes [provider]  predniSONE (DELTASONE) 50 MG tablet Take 1 tablet (50 mg total) by mouth daily with breakfast. 11/02/16  Yes Lavonia Drafts, MD  Pyridoxine HCl (VITAMIN B6 PO) Take by mouth.   Yes [provider]  Sennosides-Docusate Sodium (EX-LAX GENTLE STRENGTH PO) Take 6 capsules by mouth daily.   Yes [provider]  vitamin A 10000 UNIT capsule Take 10,000 Units by mouth daily. Reported on 04/07/2015   Yes [provider]  vitamin C (ASCORBIC ACID) 500 MG tablet Take 500 mg by mouth daily.   Yes [provider]  vitamin E 400 UNIT capsule Take 1,200 Units by mouth daily.   Yes [provider]    Allergies  Allergen Reactions  . Levaquin [Levofloxacin] Shortness Of Breath  . Prednisone Shortness Of Breath  . Albuterol Anxiety  Past Surgical History:  Procedure Laterality Date  . ABDOMINAL HYSTERECTOMY    . APPENDECTOMY    . TUBAL LIGATION      Social History   Tobacco Use  . Smoking status: Never Smoker  . Smokeless tobacco: Never Used  Substance Use Topics  . Alcohol use: No    Alcohol/week: 0.0 oz  . Drug use: No     Medication list has been reviewed and updated.  PHQ 2/9 Scores  02/23/2017 12/26/2015 04/28/2015 04/07/2015  PHQ - 2 Score 0 0 1 1   6CIT Screen 02/23/2017 12/26/2015  What Year? 0 points 0 points  What month? 0 points 0 points  What time? 0 points 0 points  Count back from 20 0 points 0 points  Months in reverse 0 points 0 points  Repeat phrase 6 points 2 points  Total Score 6 2     Physical Exam  Constitutional: She is oriented to person, place, and time. She appears well-developed. No distress.  HENT:  Head: Normocephalic and atraumatic.  Nose: Right sinus exhibits no maxillary sinus tenderness and no frontal sinus tenderness. Left sinus exhibits no maxillary sinus tenderness and no frontal sinus tenderness.  Neck: Normal range of motion. Neck supple. No thyromegaly present.  Cardiovascular: Normal rate, regular rhythm and normal heart sounds.  Pulmonary/Chest: Effort normal and breath sounds normal. No respiratory distress. She has no wheezes. She has no rales.  Musculoskeletal: Normal range of motion.  Neurological: She is alert and oriented to person, place, and time.  Skin: Skin is warm and dry. No rash noted.  Psychiatric: She has a normal mood and affect. Her speech is normal and behavior is normal. Thought content normal. She exhibits abnormal recent memory.  Nursing note and vitals reviewed.   BP 112/78   Pulse 73   Temp 97.8 F (36.6 C) (Oral)   Ht 5' (1.524 m)   Wt 150 lb (68 kg)   SpO2 97%   BMI 29.29 kg/m   Assessment and Plan: 1. Viral URI Resume inhaler use - instructed to get refills [facility] on Pulmicort  2. Cognitive decline - Ambulatory referral to Neurology   No orders of the defined types were placed in this encounter.   Partially dictated using Editor, commissioning. Any errors are unintentional.  Halina Maidens, MD [facility]  02/23/2017

## 2017-03-24 ENCOUNTER — Other Ambulatory Visit: Payer: Self-pay

## 2017-03-24 ENCOUNTER — Ambulatory Visit: Payer: Medicare HMO

## 2017-03-24 ENCOUNTER — Encounter: Payer: Self-pay | Admitting: Emergency Medicine

## 2017-03-24 ENCOUNTER — Ambulatory Visit
Admission: EM | Admit: 2017-03-24 | Discharge: 2017-03-24 | Disposition: A | Discharge status: Home / Self Care | Payer: Medicare HMO | Attending: Emergency Medicine | Admitting: Emergency Medicine

## 2017-03-24 ENCOUNTER — Emergency Department: Payer: Medicare HMO

## 2017-03-24 ENCOUNTER — Emergency Department
Admission: EM | Admit: 2017-03-24 | Discharge: 2017-03-24 | Discharge status: Against Medical Advice | Payer: Medicare HMO | Attending: Emergency Medicine | Admitting: Emergency Medicine

## 2017-03-24 DIAGNOSIS — Z7982 Long term (current) use of aspirin: Secondary | ICD-10-CM | POA: Insufficient documentation

## 2017-03-24 DIAGNOSIS — I509 Heart failure, unspecified: Secondary | ICD-10-CM | POA: Diagnosis not present

## 2017-03-24 DIAGNOSIS — R0602 Shortness of breath: Secondary | ICD-10-CM | POA: Insufficient documentation

## 2017-03-24 DIAGNOSIS — I951 Orthostatic hypotension: Secondary | ICD-10-CM | POA: Diagnosis not present

## 2017-03-24 DIAGNOSIS — J45909 Unspecified asthma, uncomplicated: Secondary | ICD-10-CM | POA: Insufficient documentation

## 2017-03-24 DIAGNOSIS — Z8673 Personal history of transient ischemic attack (TIA), and cerebral infarction without residual deficits: Secondary | ICD-10-CM | POA: Diagnosis not present

## 2017-03-24 DIAGNOSIS — I959 Hypotension, unspecified: Secondary | ICD-10-CM | POA: Diagnosis not present

## 2017-03-24 DIAGNOSIS — Z79899 Other long term (current) drug therapy: Secondary | ICD-10-CM | POA: Insufficient documentation

## 2017-03-24 DIAGNOSIS — R42 Dizziness and giddiness: Secondary | ICD-10-CM | POA: Diagnosis not present

## 2017-03-24 DIAGNOSIS — N390 Urinary tract infection, site not specified: Secondary | ICD-10-CM

## 2017-03-24 DIAGNOSIS — J9811 Atelectasis: Secondary | ICD-10-CM | POA: Diagnosis not present

## 2017-03-24 LAB — URINALYSIS, COMPLETE (UACMP) WITH MICROSCOPIC
BILIRUBIN URINE: NEGATIVE
GLUCOSE, UA: NEGATIVE mg/dL
HGB URINE DIPSTICK: NEGATIVE
Ketones, ur: NEGATIVE mg/dL
NITRITE: NEGATIVE
Protein, ur: NEGATIVE mg/dL
SPECIFIC GRAVITY, URINE: 1.024 (ref 1.005–1.030)
pH: 7 (ref 5.0–8.0)

## 2017-03-24 LAB — PROTIME-INR
INR: 0.98
Prothrombin Time: 12.9 seconds (ref 11.4–15.2)

## 2017-03-24 LAB — APTT: APTT: 28 s (ref 24–36)

## 2017-03-24 LAB — CBC
HCT: 46.3 % (ref 35.0–47.0)
HEMOGLOBIN: 15.6 g/dL (ref 12.0–16.0)
MCH: 30 pg (ref 26.0–34.0)
MCHC: 33.7 g/dL (ref 32.0–36.0)
MCV: 89.2 fL (ref 80.0–100.0)
PLATELETS: 204 10*3/uL (ref 150–440)
RBC: 5.19 MIL/uL (ref 3.80–5.20)
RDW: 12.8 % (ref 11.5–14.5)
WBC: 6.4 10*3/uL (ref 3.6–11.0)

## 2017-03-24 LAB — BASIC METABOLIC PANEL
ANION GAP: 10 (ref 5–15)
BUN: 22 mg/dL — ABNORMAL HIGH (ref 6–20)
CALCIUM: 9.1 mg/dL (ref 8.9–10.3)
CO2: 28 mmol/L (ref 22–32)
CREATININE: 0.82 mg/dL (ref 0.44–1.00)
Chloride: 100 mmol/L — ABNORMAL LOW (ref 101–111)
Glucose, Bld: 170 mg/dL — ABNORMAL HIGH (ref 65–99)
Potassium: 4 mmol/L (ref 3.5–5.1)
Sodium: 138 mmol/L (ref 135–145)

## 2017-03-24 LAB — BRAIN NATRIURETIC PEPTIDE: B Natriuretic Peptide: 113 pg/mL — ABNORMAL HIGH (ref 0.0–100.0)

## 2017-03-24 LAB — TROPONIN I: Troponin I: <0.03 ng/mL (ref <0.03)

## 2017-03-24 MED ORDER — CEFTRIAXONE SODIUM IN DEXTROSE 20 MG/ML IV SOLN
1.0000 g | Freq: Once | INTRAVENOUS | Status: AC
  Administered 2017-03-24: 1 g via INTRAVENOUS
  Filled 2017-03-24: qty 50

## 2017-03-24 MED ORDER — SODIUM CHLORIDE 0.9 % IV BOLUS (SEPSIS)
1000.0000 mL | Freq: Once | INTRAVENOUS | Status: AC
  Administered 2017-03-24: 1000 mL via INTRAVENOUS

## 2017-03-24 MED ORDER — CEPHALEXIN 500 MG PO CAPS: 500.0000 mg | ORAL_CAPSULE | Freq: Once | ORAL | Status: DC

## 2017-03-24 MED ORDER — CEPHALEXIN 500 MG PO CAPS: 500.0000 mg | ORAL_CAPSULE | Freq: Three times a day (TID) | ORAL | 0 refills | Status: DC

## 2017-03-24 NOTE — ED Notes (Signed)
E-signature box not working. Pt granddaughter signed paper copy of ED AMA discharge form.

## 2017-03-24 NOTE — ED Notes (Signed)
Pt and pt granddaughter standing at door of room. Pt states "I want to go home". Pt disconnected self from IV and began to remove IV tape. Pt redirected to room and encouraged to wait for MD to re-evaluate.

## 2017-03-24 NOTE — ED Triage Notes (Signed)
Patient comes into the ED via POV from [facility] urgent care where she was diagnosed with orthostatic hypotension.  The patient dropped 60 points on her BP from sitting to standing.  The patient then states that she gets dizzy when she stands.  Patient states that she has not had as much fluid consumption lately.  Denies any other problems at this time.

## 2017-03-24 NOTE — ED Notes (Signed)
Pt and granddaughter at door of room. Pt states "I don't want to be here and I'm going home." Dr. Burlene Arnt notified. Pt and pt granddaughter informed that discharge would be AMA.

## 2017-03-24 NOTE — ED Provider Notes (Signed)
HPI  SUBJECTIVE:  Allison Snyder is a 84 y.o. female who presents with dizziness described as feeling off balance and she states that "everything goes black".  This occurs when she stands up only.  It lasts approximately 5-10 seconds or until she lies down.  This is been going on for months, but now the patient states that it occurs every time she stands up.  Granddaughter who accompanies patient states that she seems to be getting much worse, now happening more frequently and daily.  Symptoms are worse with standing up, better with lying down.  She has not tried anything for this.  The dizziness is not associated with turning her head.  She has not fallen.  No syncope.  No chest pain.  She reports a cough for the past 2 months with increased sputum production, but no change in color.  No change in her baseline shortness of breath, wheezing.  Granddaughter states that patient's exercise tolerance is decreasing.  No fevers.  No lower extremity, unintentional weight gain, PND, nocturia.  States that she sleeps sitting up, but this is not new for her.  No abdominal pain.  No dysarthria.  Patient has had mild aphasia for the past several months.  No facial, arm, leg weakness.  No change in her medications, patient states that she does not take any medications except for a "breathing treatment" as needed.  She states that she has not needed it recently.  No urinary complaints.  She has a past medical history of vertigo, TIA, asthma/COPD hypoglycemia syndrome.  She was told that she has a CHF and atrial fibrillation, but patient denies history of this.  No history of diabetes, hypertension, MI.  Note, patient does live by herself.  [id number], Jesse Sans, MD    Past Medical History:  Diagnosis Date  . Asthma   . CHF (congestive heart failure) ([place])   . Gallbladder & bile duct stone with obstruction    issues off and on  . H/O tubal ligation   . History of appendectomy   . Hypoglycemic syndrome      Past Surgical History:  Procedure Laterality Date  . ABDOMINAL HYSTERECTOMY    . APPENDECTOMY    . TUBAL LIGATION      Family History  Problem Relation Age of Onset  . Colon cancer Mother   . Diabetes Sister     Social History   Tobacco Use  . Smoking status: Never Smoker  . Smokeless tobacco: Never Used  Substance Use Topics  . Alcohol use: No    Alcohol/week: 0.0 oz  . Drug use: No    No current facility-administered medications for this encounter.   Current Outpatient Medications:  .  aspirin EC 81 MG tablet, Take 1 tablet (81 mg total) by mouth daily., Disp: 30 tablet, Rfl: 3 .  b complex vitamins capsule, Take 1 capsule by mouth daily., Disp: , Rfl:  .  budesonide (PULMICORT) 0.5 MG/2ML nebulizer solution, Take 2 mLs (0.5 mg total) by nebulization 2 (two) times daily., Disp: 240 mL, Rfl: 5 .  calcium-vitamin D 250-100 MG-UNIT tablet, Take 1 tablet by mouth 2 (two) times daily., Disp: , Rfl:  .  Cholecalciferol (VITAMIN D3) 1000 units CAPS, Take by mouth., Disp: , Rfl:  .  Multiple Minerals-Vitamins (CALCIUM CITRATE PLUS/MAGNESIUM) TABS, Take 2 tablets by mouth daily. 1,000-500 mg, Disp: , Rfl:  .  Omega-3 Fatty Acids (FISH OIL) 1000 MG CAPS, Take 2,000 mg by mouth daily. Triple strength fish  oil, Disp: , Rfl:  .  Pyridoxine HCl (VITAMIN B6 PO), Take by mouth., Disp: , Rfl:  .  Sennosides-Docusate Sodium (EX-LAX GENTLE STRENGTH PO), Take 6 capsules by mouth daily., Disp: , Rfl:  .  vitamin A 10000 UNIT capsule, Take 10,000 Units by mouth daily. Reported on 04/07/2015, Disp: , Rfl:  .  vitamin C (ASCORBIC ACID) 500 MG tablet, Take 500 mg by mouth daily., Disp: , Rfl:  .  vitamin E 400 UNIT capsule, Take 1,200 Units by mouth daily., Disp: , Rfl:   Allergies  Allergen Reactions  . Levaquin [Levofloxacin] Shortness Of Breath  . Prednisone Shortness Of Breath  . Albuterol Anxiety     ROS  As noted in HPI.   Physical Exam  BP (!) 154/82 (BP Location: Right  Arm)   Pulse 62   Temp 98.3 F (36.8 C) (Oral)   Resp 16   Wt 150 lb (68 kg)   SpO2 98%   BMI 29.29 kg/m  Orthostatic VS for the past 24 hrs:  BP- Lying Pulse- Lying BP- Sitting Pulse- Sitting BP- Standing at 0 minutes Pulse- Standing at 0 minutes  03/24/17 1301 147/54 63 134/51 64 92/44 69    Constitutional: Well developed, well nourished, no acute distress Eyes: PERRL, EOMI, conjunctiva normal bilaterally HENT: Normocephalic, atraumatic,mucus membranes moist Respiratory: Clear to auscultation bilaterally, no rales, no wheezing, no rhonchi Cardiovascular: Normal rate and rhythm, no murmurs, no gallops, no rubs.  No carotid bruit.  Radial pulses 2+ and equal bilaterally. GI: Soft, nondistended, normal bowel sounds, nontender, no rebound, no guarding Back: no CVAT skin: No rash, skin intact Musculoskeletal: No edema, no tenderness, no deformities Neurologic: Alert & oriented x 3, CN II-XII intact, finger ->nose intact.  Shuffling gait.  No obvious ataxia.  No major drift upper extremity or lower extremity no motor deficits, sensation grossly intact Psychiatric: Speech and behavior appropriate   ED Course   Medications - No data to display  Orders Placed This Encounter  Procedures  . DG Chest 2 View    Standing Status:   Standing    Number of Occurrences:   1    Order Specific Question:   Reason for Exam (SYMPTOM  OR DIAGNOSIS REQUIRED)    Answer:   cough r/o PNA, pulm edema effusino  . Orthostatic vital signs    Standing Status:   Standing    Number of Occurrences:   1  . ED EKG    dizzy    Standing Status:   Standing    Number of Occurrences:   1    Order Specific Question:   Reason for Exam    Answer:   Other (See Comments)  . EKG 12-Lead    Standing Status:   Standing    Number of Occurrences:   1   No results found for this or any previous visit (from the past 24 hour(s)). No results found.  ED Clinical Impression  Orthostasis   ED  Assessment/Plan   Previous outside records reviewed.  She had a TIA in March of last year, she had a subsequent carotid Doppler ultrasound which showed no flow limiting stenosis. Last cards eval in 2016- nothing recent per record review.  No ER visits on records for this.  Patient was seen by her primary care physician for dizziness, prescribed meclizine, which the patient states that she did not take.  EKG: Normal sinus rhythm, rate 62.  Left axis deviation.  Right bundle branch block with  T wave inversion in 3, aVF, V3, V4.  No reciprocal changes.  No ST elevation.  This was present on previous EKG in 10/2016.  Patient is significantly orthostatic.  Feel that this is the cause of her symptoms.  Doubt stroke or acute cardiac event.  Concern for dehydration as she has very poor p.o. intake.  Transferring to the ED for a comprehensive workup and possible IV fluids.  Discussed MDM, rationale for transfer to the [facility] ED with patient and granddaughter who accompanies the patient.  They agree with plan.  Feel that she is stable to go by private vehicle.   No orders of the defined types were placed in this encounter.   *This clinic note was created using Dragon dictation software. Therefore, there may be occasional mistakes despite careful proofreading.  ?    Melynda Ripple, MD 03/24/17 1359

## 2017-03-24 NOTE — Discharge Instructions (Signed)
You refused to stay in the hospital, this is certainly you  choice but it limits her ability to take care of you and ensure that you are safe.  If you feel worse in any way, change your mind, fall, have any new or worrisome symptoms or feel worse in any concerning way return to the emergency room.  Drink plenty of fluids, stand up very slowly when you stand up, please stop taking the laxatives as I think they are making you  problem worse, take the antibiotics until they are gone and follow closely with primary care.  Do not drive, climb ladders, soak in the tub, drive vehicles, and be very careful on stairs.  If you change your mind or feel worse please return to the emergency department

## 2017-03-24 NOTE — ED Provider Notes (Addendum)
[facility] Emergency Department Provider Note  ____________________________________________   I have reviewed the triage vital signs and the nursing notes. Where available I have reviewed prior notes and, if possible and indicated, outside hospital notes.    HISTORY  Chief Complaint Orthostatic Hypotension    HPI Allison Snyder is a 84 y.o. female takes no medications aside from "vitamins" and "6 laxatives a day".  Patient has had a history of being orthostatic, she has had outpatient workup for this including cardiology evaluation and a Holter monitor, and the family over the last 3 days she is gotten much more lightheaded when she stands up over baseline.  Patient states that she feels like she is going to pass out but has not passed out.  The patient has "no pain in her whole body", she denies dysuria urinary frequency, she states she does drink fluid.  She has poor recollection of when her last bowel movement was but she states that it was "loose" however, she is continuing to take laxatives.  Patient has poor memory at baseline and is a poor historian, a lot of the story is supplemented by her family member who is in the room.  Level 5 chart caveat; no further history available due to patient status.  Patient does have a known history of right bundle branch block she also has had asthma "her whole life" I do not does not feel that is any worse than normal although sometimes she does cough up mucus she states.  She does not feel short of breath this time.  She always sleeps sitting up in recliner has not had any change in that recently.  Not a smoker. Cardiology workup 2 months ago suggested the patient had no dysrhythmias, she had preserved LV function, she was advised hydration at that time.   Past Medical History:  Diagnosis Date  . Asthma   . CHF (congestive heart failure) ([place])   . Gallbladder & bile duct stone with obstruction    issues off and on  .  H/O tubal ligation   . History of appendectomy   . Hypoglycemic syndrome     Patient Active Problem List   Diagnosis Date Noted  . Cognitive decline 02/23/2017  . Hyperglycemia 09/17/2016  . Episodic lightheadedness 09/17/2016  . Transient cerebral ischemia 05/22/2016  . Varicose veins of both lower extremities 04/07/2015  . Breast mass, left 12/11/2014  . Bilateral renal cysts 09/23/2014  . Asthma, mild persistent 07/27/2014  . Aortic atherosclerosis ([place]) 07/27/2014  . Neoplasm of uncertain behavior of skin 07/27/2014  . Impaired renal function 07/27/2014    Past Surgical History:  Procedure Laterality Date  . ABDOMINAL HYSTERECTOMY    . APPENDECTOMY    . TUBAL LIGATION      Prior to Admission medications   Medication Sig Start Date End Date Taking? Authorizing Provider  b complex vitamins capsule Take 1 capsule by mouth daily.   Yes [provider]  calcium-vitamin D 250-100 MG-UNIT tablet Take 1 tablet by mouth 2 (two) times daily.   Yes [provider]  Cholecalciferol (VITAMIN D3) 1000 units CAPS Take by mouth.   Yes [provider]  Multiple Minerals-Vitamins (CALCIUM CITRATE PLUS/MAGNESIUM) TABS Take 2 tablets by mouth daily. 1,000-500 mg   Yes [provider]  Omega-3 Fatty Acids (FISH OIL) 1000 MG CAPS Take 2,000 mg by mouth daily. Triple strength fish oil   Yes [provider]  Pyridoxine HCl (VITAMIN B6 PO) Take by  mouth.   Yes [provider]  Sennosides-Docusate Sodium (EX-LAX GENTLE STRENGTH PO) Take 6 capsules by mouth daily.   Yes [provider]  vitamin A 10000 UNIT capsule Take 10,000 Units by mouth daily. Reported on 04/07/2015   Yes [provider]  vitamin C (ASCORBIC ACID) 500 MG tablet Take 500 mg by mouth daily.   Yes [provider]  vitamin E 400 UNIT capsule Take 1,200 Units by mouth daily.   Yes [provider]  aspirin EC 81 MG tablet Take 1 tablet (81 mg  total) by mouth daily. Patient not taking: Reported on 03/24/2017 05/21/16 05/21/17  Harvest Dark, MD  budesonide (PULMICORT) 0.5 MG/2ML nebulizer solution Take 2 mLs (0.5 mg total) by nebulization 2 (two) times daily. Patient not taking: Reported on 03/24/2017 05/26/16   Glean Hess, MD    Allergies Levaquin [levofloxacin]; Prednisone; and Albuterol  Family History  Problem Relation Age of Onset  . Colon cancer Mother   . Diabetes Sister     Social History Social History   Tobacco Use  . Smoking status: Never Smoker  . Smokeless tobacco: Never Used  Substance Use Topics  . Alcohol use: No    Alcohol/week: 0.0 oz  . Drug use: No    Review of Systems Constitutional: No fever/chills Eyes: No visual changes. ENT: No sore throat. No stiff neck no neck pain Cardiovascular: Denies chest pain. Respiratory: Denies shortness of breath over baseline Gastrointestinal:   no vomiting.  No diarrhea.  No constipation. Genitourinary: Negative for dysuria. Musculoskeletal: Negative lower extremity swelling Skin: Negative for rash. Neurological: Negative for severe headaches, focal weakness or numbness.   ____________________________________________   PHYSICAL EXAM:  VITAL SIGNS: ED Triage Vitals  Enc Vitals Group     BP 03/24/17 1427 93/65     Pulse Rate 03/24/17 1427 73     Resp 03/24/17 1427 18     Temp 03/24/17 1427 97.6 F (36.4 C)     Temp Source 03/24/17 1427 Oral     SpO2 03/24/17 1427 95 %     Weight 03/24/17 1428 150 lb (68 kg)     Height 03/24/17 1428 5\' 4"  (1.626 m)     Head Circumference --      Peak Flow --      Pain Score --      Pain Loc --      Pain Edu? --      Excl. in [place]? --     Constitutional: Alert and oriented. Well appearing and in no acute distress. Eyes: Conjunctivae are normal Head: Atraumatic HEENT: No congestion/rhinnorhea. Mucous membranes are moist.  Oropharynx non-erythematous Neck:   Nontender with no meningismus, no masses,  no stridor Cardiovascular: Normal rate, regular rhythm. Grossly normal heart sounds.  Good peripheral circulation. Respiratory: Normal respiratory effort.  No retractions. Lungs CTAB. Abdominal: Soft and nontender. No distention. No guarding no rebound Back:  There is no focal tenderness or step off.  there is no midline tenderness there are no lesions noted. there is no CVA tenderness Musculoskeletal: No lower extremity tenderness, no upper extremity tenderness. No joint effusions, no DVT signs strong distal pulses no edema Neurologic:  Normal speech and language. No gross focal neurologic deficits are appreciated.  Skin:  Skin is warm, dry and intact. No rash noted. Psychiatric: Mood and affect are normal. Speech and behavior are normal.  ____________________________________________   LABS (all labs ordered are listed, but only abnormal results are displayed)  Labs  Reviewed  BASIC METABOLIC PANEL - Abnormal; Notable for the following components:      Result Value   Chloride 100 (*)    Glucose, Bld 170 (*)    BUN 22 (*)    All other components within normal limits  CBC  URINALYSIS, COMPLETE (UACMP) WITH MICROSCOPIC  BRAIN NATRIURETIC PEPTIDE  TROPONIN I    Pertinent labs  results that were available during my care of the patient were reviewed by me and considered in my medical decision making (see chart for details). ____________________________________________  EKG  I personally interpreted any EKGs ordered by me or triage Right bundle branch block, which is old, LAD, LAFB, no acute ST elevation or depression ____________________________________________  RADIOLOGY  Pertinent labs & imaging results that were available during my care of the patient were reviewed by me and considered in my medical decision making (see chart for details). If possible, patient and/or family made aware of any abnormal findings.  No results found. ____________________________________________     PROCEDURES  Procedure(s) performed: None  Procedures  Critical Care performed: None  ____________________________________________   INITIAL IMPRESSION / ASSESSMENT AND PLAN / ED COURSE  Pertinent labs & imaging results that were available during my care of the patient were reviewed by me and considered in my medical decision making (see chart for details).  Here with significant orthostatic vital signs, pressures in the 70s when she stands up.  She is otherwise well-appearing, my suspicion that this has to do with volume depletion from chronic laxative abuse and poor intake, blood work is reassuring thus far we pain BMP and troponin to make sure there is been no silent ischemia resulting in patient's positional symptoms, chest x-ray will be obtained given chronic history of cough to make sure that there is no contributing pneumonia, we will check urinalysis, will give her hydration and reassess.  ----------------------------------------- 5:08 PM on 03/24/2017 -----------------------------------------  Patient has what appears to be urinary tract infection which I think is certainly sufficient to cause her symptoms, she is always orthostatic by history, and now she is apparently with a UTI.  We will give her IV antibiotics.  Explained my preference that she stay in the hospital but she is adamant that she would like to go home.  We will reassess her orthostatics after IV fluid, and antibiotics.  Obviously, our hope is to get her where she would like to be but we need to do so safely as possible.    ----------------------------------------- 5:31 PM on 03/24/2017 -----------------------------------------  Patient adamant she will not be admitted, family and bedside they all understand the risk benefits and alternatives to admission, patient looks well vital signs are reassuring when, this is a chronic problem for her she is adamant she will never stay in the hospital for this, I think  she understands the risk of going home which have explained to her include passing out hitting her head head bleed broken hip death etc.  However she is adamant that she would leave.  Family agree.  We will discharge therefore after AMA is signed.  I am try to give her a dose of Rocephin here prior to discharge and urine culture has been ordered.  Return precautions and follow-up given and understood patient understands she can come back anytime she wants to    ____________________________________________   FINAL CLINICAL IMPRESSION(S) / ED DIAGNOSES  Final diagnoses:  SOB (shortness of breath)      This chart was dictated using voice recognition software.  Despite best efforts to proofread,  errors can occur which can change meaning.      Schuyler Amor, MD 03/24/17 1605    Schuyler Amor, MD 03/24/17 1709    Schuyler Amor, MD 03/24/17 [phone]

## 2017-03-24 NOTE — ED Notes (Signed)
FIRST NURSE NOTE: pt sent from [facility] urgent care with c/o dizziness upon standing., reported pt is orthostatic there and was referred to the ED for further eval and treatment.

## 2017-03-24 NOTE — ED Notes (Signed)
Pt ambulated down hallway without difficulty 

## 2017-03-24 NOTE — ED Notes (Signed)
Pt screamed and held her head between her hands stating "I want to go home". Pt encouraged to stay and received treatment for UTI. Pt agreed. Granddaughter at bedside.

## 2017-03-24 NOTE — ED Triage Notes (Signed)
Patient c/o dizziness for several months.  Patient reports history of vertigo.  Patient denies SOB or chest pain.

## 2017-03-26 ENCOUNTER — Other Ambulatory Visit: Payer: Self-pay

## 2017-03-26 ENCOUNTER — Emergency Department: Payer: Medicare HMO

## 2017-03-26 ENCOUNTER — Emergency Department
Admission: EM | Admit: 2017-03-26 | Discharge: 2017-03-26 | Disposition: A | Discharge status: Home / Self Care | Payer: Medicare HMO | Attending: Emergency Medicine | Admitting: Emergency Medicine

## 2017-03-26 DIAGNOSIS — R110 Nausea: Secondary | ICD-10-CM

## 2017-03-26 DIAGNOSIS — N390 Urinary tract infection, site not specified: Secondary | ICD-10-CM | POA: Diagnosis not present

## 2017-03-26 DIAGNOSIS — Z7982 Long term (current) use of aspirin: Secondary | ICD-10-CM | POA: Insufficient documentation

## 2017-03-26 DIAGNOSIS — I509 Heart failure, unspecified: Secondary | ICD-10-CM | POA: Diagnosis not present

## 2017-03-26 DIAGNOSIS — J4530 Mild persistent asthma, uncomplicated: Secondary | ICD-10-CM | POA: Insufficient documentation

## 2017-03-26 DIAGNOSIS — Z79899 Other long term (current) drug therapy: Secondary | ICD-10-CM | POA: Insufficient documentation

## 2017-03-26 LAB — COMPREHENSIVE METABOLIC PANEL
ALT: 13 U/L — ABNORMAL LOW (ref 14–54)
ANION GAP: 9 (ref 5–15)
AST: 22 U/L (ref 15–41)
Albumin: 4.1 g/dL (ref 3.5–5.0)
Alkaline Phosphatase: 67 U/L (ref 38–126)
BILIRUBIN TOTAL: 0.7 mg/dL (ref 0.3–1.2)
BUN: 19 mg/dL (ref 6–20)
CALCIUM: 9.1 mg/dL (ref 8.9–10.3)
CO2: 30 mmol/L (ref 22–32)
Chloride: 100 mmol/L — ABNORMAL LOW (ref 101–111)
Creatinine, Ser: 0.83 mg/dL (ref 0.44–1.00)
GFR calc Af Amer: >60 mL/min (ref >60)
Glucose, Bld: 108 mg/dL — ABNORMAL HIGH (ref 65–99)
Potassium: 4.1 mmol/L (ref 3.5–5.1)
Sodium: 139 mmol/L (ref 135–145)
TOTAL PROTEIN: 7.5 g/dL (ref 6.5–8.1)

## 2017-03-26 LAB — CBC
HEMATOCRIT: 46.9 % (ref 35.0–47.0)
HEMOGLOBIN: 15.6 g/dL (ref 12.0–16.0)
MCH: 30 pg (ref 26.0–34.0)
MCHC: 33.3 g/dL (ref 32.0–36.0)
MCV: 90.1 fL (ref 80.0–100.0)
Platelets: 213 10*3/uL (ref 150–440)
RBC: 5.2 MIL/uL (ref 3.80–5.20)
RDW: 12.9 % (ref 11.5–14.5)
WBC: 8.1 10*3/uL (ref 3.6–11.0)

## 2017-03-26 LAB — URINE CULTURE

## 2017-03-26 LAB — URINALYSIS, COMPLETE (UACMP) WITH MICROSCOPIC
BACTERIA UA: NONE SEEN
BILIRUBIN URINE: NEGATIVE
Glucose, UA: NEGATIVE mg/dL
HGB URINE DIPSTICK: NEGATIVE
KETONES UR: NEGATIVE mg/dL
LEUKOCYTES UA: NEGATIVE
NITRITE: NEGATIVE
Protein, ur: NEGATIVE mg/dL
Specific Gravity, Urine: 1.008 (ref 1.005–1.030)
pH: 7 (ref 5.0–8.0)

## 2017-03-26 LAB — LIPASE, BLOOD: Lipase: 27 U/L (ref 11–51)

## 2017-03-26 MED ORDER — ONDANSETRON 4 MG PO TBDP: 4.0000 mg | ORAL_TABLET | Freq: Four times a day (QID) | ORAL | 0 refills | Status: DC | PRN

## 2017-03-26 MED ORDER — ONDANSETRON HCL 4 MG/2ML IJ SOLN
4.0000 mg | Freq: Once | INTRAMUSCULAR | Status: DC
  Filled 2017-03-26: qty 2

## 2017-03-26 MED ORDER — SODIUM CHLORIDE 0.9 % IV BOLUS (SEPSIS)
500.0000 mL | Freq: Once | INTRAVENOUS | Status: AC
  Administered 2017-03-26: 500 mL via INTRAVENOUS

## 2017-03-26 NOTE — Discharge Instructions (Signed)
You have been seen in the Emergency Department (ED) today for nausea and recently started on treatment for a urinary tract infection UTI, as well.  Call your regular doctor to schedule the next available appointment to follow up on today?s ED visit, or return immediately to the ED if your pain worsens, you have vomiting, confusion, a fever, you have decreased urine production, develop fever, persistent vomiting, or other symptoms that concern you.

## 2017-03-26 NOTE — ED Provider Notes (Signed)
[facility] Emergency Department Provider Note ____________________________________________   First MD Initiated Contact with Patient 03/26/17 1601     (approximate)  I have reviewed the triage vital signs and the nursing notes.   HISTORY  Chief Complaint Constipation  HPI Allison Snyder is a 84 y.o. female minimal past medical history including CHF asthma, previous biliary disease  Patient presented a couple of days ago, seen by Dr. Burlene Arnt and diagnosed with orthostatic hypotension and urinary tract infection.  She was discharged on antibiotic after treatment with Rocephin.  Patient reports for the last 3 days she has been experiencing ongoing nausea, denies any pain.  Reports she just feels nauseated.  No other significant associated symptoms, but does report slight increase in fatigue.  To drink water well but has not had as much to eat.  She reports that she has fairly severe constipation on a regular basis and takes up to 6 laxative pills daily, took them over the last couple of days since she has been on the antibiotic.  However she denies feeling bloated, backed up, or otherwise "constipated"  No chest pain.  No trouble breathing.  No confusion.  No headaches or weakness.  No fevers or chills.  No pain or burning with urination.  Past Medical History:  Diagnosis Date  . Asthma   . CHF (congestive heart failure) ([place])   . Gallbladder & bile duct stone with obstruction    issues off and on  . H/O tubal ligation   . History of appendectomy   . Hypoglycemic syndrome     Patient Active Problem List   Diagnosis Date Noted  . Cognitive decline 02/23/2017  . Hyperglycemia 09/17/2016  . Episodic lightheadedness 09/17/2016  . Transient cerebral ischemia 05/22/2016  . Varicose veins of both lower extremities 04/07/2015  . Breast mass, left 12/11/2014  . Bilateral renal cysts 09/23/2014  . Asthma, mild persistent 07/27/2014  . Aortic  atherosclerosis ([place]) 07/27/2014  . Neoplasm of uncertain behavior of skin 07/27/2014  . Impaired renal function 07/27/2014    Past Surgical History:  Procedure Laterality Date  . ABDOMINAL HYSTERECTOMY    . APPENDECTOMY    . TUBAL LIGATION      Prior to Admission medications   Medication Sig Start Date End Date Taking? Authorizing Provider  aspirin EC 81 MG tablet Take 1 tablet (81 mg total) by mouth daily. 05/21/16 05/21/17 Yes PaduchowskiLennette Bihari, MD  b complex vitamins capsule Take 1 capsule by mouth daily.   Yes [provider]  calcium-vitamin D 250-100 MG-UNIT tablet Take 1 tablet by mouth 2 (two) times daily.   Yes [provider]  cephALEXin (KEFLEX) 500 MG capsule Take 1 capsule (500 mg total) by mouth 3 (three) times daily for 10 days. 03/24/17 04/03/17 Yes Schuyler Amor, MD  Cholecalciferol (VITAMIN D3) 1000 units CAPS Take by mouth.   Yes [provider]  Multiple Minerals-Vitamins (CALCIUM CITRATE PLUS/MAGNESIUM) TABS Take 2 tablets by mouth daily. 1,000-500 mg   Yes [provider]  Omega-3 Fatty Acids (FISH OIL) 1000 MG CAPS Take 2,000 mg by mouth daily. Triple strength fish oil   Yes [provider]  Pyridoxine HCl (VITAMIN B6 PO) Take by mouth.   Yes [provider]  Sennosides-Docusate Sodium (EX-LAX GENTLE STRENGTH PO) Take 6 capsules by mouth daily.   Yes [provider]  vitamin A 10000 UNIT capsule Take 10,000 Units by mouth daily. Reported on 04/07/2015   Yes [provider]  vitamin C (ASCORBIC ACID) 500 MG tablet Take 500 mg by mouth daily.   Yes [provider]  vitamin E 400 UNIT capsule Take 1,200 Units by mouth daily.   Yes [provider]  budesonide (PULMICORT) 0.5 MG/2ML nebulizer solution Take 2 mLs (0.5 mg total) by nebulization 2 (two) times daily. Patient not taking: Reported on 03/24/2017 05/26/16   Glean Hess, MD  ondansetron (ZOFRAN ODT) 4 MG disintegrating  tablet Take 1 tablet (4 mg total) by mouth every 6 (six) hours as needed for nausea or vomiting. 03/26/17   Delman Kitten, MD    Allergies Levaquin [levofloxacin]; Prednisone; and Albuterol  Family History  Problem Relation Age of Onset  . Colon cancer Mother   . Diabetes Sister     Social History Social History   Tobacco Use  . Smoking status: Never Smoker  . Smokeless tobacco: Never Used  Substance Use Topics  . Alcohol use: No    Alcohol/week: 0.0 oz  . Drug use: No    Review of Systems Constitutional: No fever/chills reports feeling generally fatigued.  Still able to walk without difficulty. Eyes: No visual changes. ENT: No sore throat. Cardiovascular: Denies chest pain. Respiratory: Denies shortness of breath. Gastrointestinal: No abdominal pain.  No vomiting just some mild nausea.  No diarrhea.  No constipation. Genitourinary: Negative for dysuria. Musculoskeletal: Negative for back pain. Skin: Negative for rash. Neurological: Negative for headaches, focal weakness or numbness.    ____________________________________________   PHYSICAL EXAM:  VITAL SIGNS: ED Triage Vitals [03/26/17 1341]  Enc Vitals Group     BP (!) 151/66     Pulse Rate 81     Resp 18     Temp 98.2 F (36.8 C)     Temp Source Oral     SpO2 96 %     Weight 150 lb (68 kg)     Height 5\' 4"  (1.626 m)     Head Circumference      Peak Flow      Pain Score      Pain Loc      Pain Edu?      Excl. in [place]?     Constitutional: Alert and oriented. Well appearing and in no acute distress.  She does report short-term memory loss which is chronic for her. Eyes: Conjunctivae are normal. Head: Atraumatic. Nose: No congestion/rhinnorhea. Mouth/Throat: Mucous membranes are slightly dry. Neck: No stridor.   Cardiovascular: Normal rate, regular rhythm. Grossly normal heart sounds.  Good peripheral circulation. Respiratory: Normal respiratory effort.  No retractions. Lungs CTAB. Gastrointestinal:  Soft and nontender. No distention.  Mobile bowel sounds.  No peritonitis.  No rebound or guarding. Musculoskeletal: No lower extremity tenderness nor edema. Neurologic:  Normal speech and language. No gross focal neurologic deficits are appreciated.  Skin:  Skin is warm, dry and intact. No rash noted.  Skin turgor is slowed. Psychiatric: Mood and affect are normal. Speech and behavior are normal.  ____________________________________________   LABS (all labs ordered are listed, but only abnormal results are displayed)  Labs Reviewed  COMPREHENSIVE METABOLIC PANEL - Abnormal; Notable for the following components:      Result Value   Chloride 100 (*)    Glucose, Bld 108 (*)    ALT 13 (*)    All other components within normal limits  URINALYSIS, COMPLETE (UACMP) WITH MICROSCOPIC - Abnormal; Notable for the following components:   Color, Urine YELLOW (*)    APPearance CLOUDY (*)  Squamous Epithelial / LPF 0-5 (*)    All other components within normal limits  LIPASE, BLOOD  CBC   ____________________________________________  EKG  Patient denies any palpitations, chest pain or pulmonary symptoms.  Stable hemodynamics, on telemetry normal sinus rhythm. ____________________________________________  RADIOLOGY  Dg Abd 2 Views  Result Date: 03/26/2017 CLINICAL DATA:  No bowel movement for 3 days.  Nausea. EXAM: ABDOMEN - 2 VIEW COMPARISON:  CT 07/30/2014. FINDINGS: The bowel gas pattern is nonobstructive. There is no free intraperitoneal air or bowel wall thickening. Moderate stool noted throughout the colon. Scattered pelvic calcifications are stable, likely all phleboliths. There is mild aortic atherosclerosis. There is a convex right lumbar scoliosis. IMPRESSION: No acute abdominal findings. Increased colonic stool burden consistent with constipation. Electronically Signed   By: Richardean Sale M.D.   On: 03/26/2017 17:19    Abdominal x-ray, no acute.  Notable increased stool  burden ____________________________________________   PROCEDURES  Procedure(s) performed: None  Procedures  Critical Care performed: No  ____________________________________________   INITIAL IMPRESSION / ASSESSMENT AND PLAN / ED COURSE  Pertinent labs & imaging results that were available during my care of the patient were reviewed by me and considered in my medical decision making (see chart for details).  Patient presents for evaluation of mild nausea and some fatigue.  She has been recently diagnosed with urinary tract infection.  Urinalysis today indicates that this is cleared.  She is stable hemodynamics her lab work is very reassuring.  No leukocytosis no fevers.  No neurologic complaints, no headache, no cardiac complaints.  She is awake alert well oriented.  No distress.  Exam does suggest she may have some very mild dehydration, she has increased skin turgor and dry mucous membranes.  I will hydrate her and provide antiemetic and we will reevaluate.  Clinical Course as of Mar 27 1735  Sat Mar 26, 2017  1724 Reevaluate patient, resting comfortably.  She reports her nausea if does feel better, she is been hydrated and is no distress.  Discussed results, she reports that she will go to [facility] to get her prescription filled for nausea medicine and also to get a refill of her laxatives that she had run out of.  Patient alert, oriented, no complaints at the present time.  Reports she feels well.  Return precautions and treatment recommendations and follow-up discussed with the patient who is agreeable with the plan.   [MQ]    Clinical Course User Index [MQ] Delman Kitten, MD     ____________________________________________   FINAL CLINICAL IMPRESSION(S) / ED DIAGNOSES  Final diagnoses:  Lower urinary tract infectious disease  Nausea      NEW MEDICATIONS STARTED DURING THIS VISIT:  New Prescriptions   ONDANSETRON (ZOFRAN ODT) 4 MG DISINTEGRATING TABLET    Take 1  tablet (4 mg total) by mouth every 6 (six) hours as needed for nausea or vomiting.     Note:  This document was prepared using Dragon voice recognition software and may include unintentional dictation errors.     Delman Kitten, MD 03/26/17 [phone]

## 2017-03-26 NOTE — ED Notes (Signed)
Pt ambulatory upon discharge. Verbalized understanding of discharge instructions, prescription and follow-up care. VSS. Skin warm and dry. A&O x4.

## 2017-03-26 NOTE — ED Notes (Signed)
Dr. Quale at bedside.  

## 2017-03-26 NOTE — ED Triage Notes (Signed)
Pt came to ED via pov c/o no bowel movement in 3 days. Denies abdominal pain, reports some nausea.

## 2017-03-26 NOTE — ED Notes (Signed)
Pt reports that she has not had a BM in 3 days. Pt states that she normally goes every day. Pt reports taking 6 laxatives every day. Pt thinks that she may have missed a few doses of laxatives. Pt denies vomiting but states that she has been nauseated. Pt states that she is not passing any gas. Pt denies abdominal pain.

## 2017-04-02 ENCOUNTER — Encounter: Payer: Self-pay | Admitting: Emergency Medicine

## 2017-04-02 ENCOUNTER — Emergency Department: Payer: Medicare HMO

## 2017-04-02 ENCOUNTER — Observation Stay
Admission: EM | Admit: 2017-04-02 | Discharge: 2017-04-04 | Disposition: A | Discharge status: Home / Self Care | Payer: Medicare HMO | Attending: Internal Medicine | Admitting: Internal Medicine

## 2017-04-02 ENCOUNTER — Other Ambulatory Visit: Payer: Self-pay

## 2017-04-02 DIAGNOSIS — R2681 Unsteadiness on feet: Secondary | ICD-10-CM | POA: Insufficient documentation

## 2017-04-02 DIAGNOSIS — I4510 Unspecified right bundle-branch block: Secondary | ICD-10-CM | POA: Diagnosis not present

## 2017-04-02 DIAGNOSIS — Z7982 Long term (current) use of aspirin: Secondary | ICD-10-CM | POA: Insufficient documentation

## 2017-04-02 DIAGNOSIS — I110 Hypertensive heart disease with heart failure: Secondary | ICD-10-CM | POA: Insufficient documentation

## 2017-04-02 DIAGNOSIS — E861 Hypovolemia: Secondary | ICD-10-CM | POA: Insufficient documentation

## 2017-04-02 DIAGNOSIS — M6281 Muscle weakness (generalized): Secondary | ICD-10-CM | POA: Diagnosis not present

## 2017-04-02 DIAGNOSIS — Z881 Allergy status to other antibiotic agents status: Secondary | ICD-10-CM | POA: Insufficient documentation

## 2017-04-02 DIAGNOSIS — Z888 Allergy status to other drugs, medicaments and biological substances status: Secondary | ICD-10-CM | POA: Insufficient documentation

## 2017-04-02 DIAGNOSIS — R531 Weakness: Secondary | ICD-10-CM

## 2017-04-02 DIAGNOSIS — I5032 Chronic diastolic (congestive) heart failure: Secondary | ICD-10-CM | POA: Insufficient documentation

## 2017-04-02 DIAGNOSIS — Z79899 Other long term (current) drug therapy: Secondary | ICD-10-CM | POA: Diagnosis not present

## 2017-04-02 DIAGNOSIS — R42 Dizziness and giddiness: Secondary | ICD-10-CM | POA: Diagnosis not present

## 2017-04-02 DIAGNOSIS — I951 Orthostatic hypotension: Principal | ICD-10-CM | POA: Diagnosis present

## 2017-04-02 DIAGNOSIS — I480 Paroxysmal atrial fibrillation: Secondary | ICD-10-CM | POA: Insufficient documentation

## 2017-04-02 DIAGNOSIS — M810 Age-related osteoporosis without current pathological fracture: Secondary | ICD-10-CM | POA: Diagnosis not present

## 2017-04-02 DIAGNOSIS — R748 Abnormal levels of other serum enzymes: Secondary | ICD-10-CM | POA: Diagnosis not present

## 2017-04-02 DIAGNOSIS — J45909 Unspecified asthma, uncomplicated: Secondary | ICD-10-CM | POA: Insufficient documentation

## 2017-04-02 DIAGNOSIS — N390 Urinary tract infection, site not specified: Secondary | ICD-10-CM | POA: Diagnosis not present

## 2017-04-02 DIAGNOSIS — I959 Hypotension, unspecified: Secondary | ICD-10-CM | POA: Diagnosis not present

## 2017-04-02 DIAGNOSIS — I4891 Unspecified atrial fibrillation: Secondary | ICD-10-CM | POA: Diagnosis not present

## 2017-04-02 LAB — BASIC METABOLIC PANEL
ANION GAP: 9 (ref 5–15)
BUN: 16 mg/dL (ref 6–20)
CO2: 30 mmol/L (ref 22–32)
Calcium: 9.3 mg/dL (ref 8.9–10.3)
Chloride: 101 mmol/L (ref 101–111)
Creatinine, Ser: 0.84 mg/dL (ref 0.44–1.00)
GFR calc Af Amer: >60 mL/min (ref >60)
Glucose, Bld: 104 mg/dL — ABNORMAL HIGH (ref 65–99)
POTASSIUM: 3.9 mmol/L (ref 3.5–5.1)
SODIUM: 140 mmol/L (ref 135–145)

## 2017-04-02 LAB — CBC
HEMATOCRIT: 50 % — AB (ref 35.0–47.0)
HEMOGLOBIN: 16.7 g/dL — AB (ref 12.0–16.0)
MCH: 30.1 pg (ref 26.0–34.0)
MCHC: 33.4 g/dL (ref 32.0–36.0)
MCV: 90.1 fL (ref 80.0–100.0)
Platelets: 239 10*3/uL (ref 150–440)
RBC: 5.55 MIL/uL — ABNORMAL HIGH (ref 3.80–5.20)
RDW: 13 % (ref 11.5–14.5)
WBC: 6.4 10*3/uL (ref 3.6–11.0)

## 2017-04-02 LAB — URINALYSIS, COMPLETE (UACMP) WITH MICROSCOPIC
BACTERIA UA: NONE SEEN
Bilirubin Urine: NEGATIVE
GLUCOSE, UA: NEGATIVE mg/dL
Hgb urine dipstick: NEGATIVE
Ketones, ur: NEGATIVE mg/dL
Leukocytes, UA: NEGATIVE
NITRITE: NEGATIVE
PH: 6 (ref 5.0–8.0)
PROTEIN: NEGATIVE mg/dL
Specific Gravity, Urine: 1.008 (ref 1.005–1.030)

## 2017-04-02 LAB — TROPONIN I
Troponin I: 0.06 ng/mL (ref <0.03)
Troponin I: 0.05 ng/mL (ref <0.03)

## 2017-04-02 LAB — TSH: TSH: 1.282 u[IU]/mL (ref 0.350–4.500)

## 2017-04-02 MED ORDER — ONDANSETRON HCL 4 MG/2ML IJ SOLN: 4.0000 mg | Freq: Four times a day (QID) | INTRAMUSCULAR | Status: DC | PRN

## 2017-04-02 MED ORDER — VITAMIN D 1000 UNITS PO TABS
5000.0000 [IU] | ORAL_TABLET | Freq: Every day | ORAL | Status: DC
  Administered 2017-04-02 – 2017-04-04 (×3): 5000 [IU] via ORAL
  Filled 2017-04-02 (×3): qty 5

## 2017-04-02 MED ORDER — ACETAMINOPHEN 650 MG RE SUPP: 650.0000 mg | Freq: Four times a day (QID) | RECTAL | Status: DC | PRN

## 2017-04-02 MED ORDER — ASPIRIN EC 81 MG PO TBEC
81.0000 mg | DELAYED_RELEASE_TABLET | Freq: Every day | ORAL | Status: DC
  Administered 2017-04-02 – 2017-04-04 (×3): 81 mg via ORAL
  Filled 2017-04-02 (×3): qty 1

## 2017-04-02 MED ORDER — VITAMIN E 180 MG (400 UNIT) PO CAPS
1200.0000 [IU] | ORAL_CAPSULE | Freq: Every day | ORAL | Status: DC
  Administered 2017-04-02 – 2017-04-04 (×3): 1200 [IU] via ORAL
  Filled 2017-04-02 (×3): qty 3

## 2017-04-02 MED ORDER — SODIUM CHLORIDE 0.9 % IV BOLUS (SEPSIS)
500.0000 mL | Freq: Once | INTRAVENOUS | Status: AC
  Administered 2017-04-02: 500 mL via INTRAVENOUS

## 2017-04-02 MED ORDER — VITAMIN A 10000 UNITS PO CAPS
10000.0000 [IU] | ORAL_CAPSULE | Freq: Every day | ORAL | Status: DC
  Administered 2017-04-02 – 2017-04-04 (×3): 10000 [IU] via ORAL
  Filled 2017-04-02 (×3): qty 1

## 2017-04-02 MED ORDER — ACETAMINOPHEN 325 MG PO TABS: 650.0000 mg | ORAL_TABLET | Freq: Four times a day (QID) | ORAL | Status: DC | PRN

## 2017-04-02 MED ORDER — SODIUM CHLORIDE 0.9 % IV BOLUS (SEPSIS)
1000.0000 mL | Freq: Once | INTRAVENOUS | Status: AC
  Administered 2017-04-02: 1000 mL via INTRAVENOUS

## 2017-04-02 MED ORDER — CEPHALEXIN 500 MG PO CAPS
500.0000 mg | ORAL_CAPSULE | Freq: Three times a day (TID) | ORAL | Status: AC
  Administered 2017-04-02 – 2017-04-03 (×3): 500 mg via ORAL
  Filled 2017-04-02 (×3): qty 1

## 2017-04-02 MED ORDER — CALCIUM CARBONATE-VITAMIN D 500-200 MG-UNIT PO TABS
1.0000 | ORAL_TABLET | Freq: Two times a day (BID) | ORAL | Status: DC
  Administered 2017-04-02 – 2017-04-04 (×4): 1 via ORAL
  Filled 2017-04-02 (×3): qty 1

## 2017-04-02 MED ORDER — MIDODRINE HCL 5 MG PO TABS
5.0000 mg | ORAL_TABLET | Freq: Three times a day (TID) | ORAL | Status: DC
  Administered 2017-04-03 – 2017-04-04 (×4): 5 mg via ORAL
  Filled 2017-04-02 (×4): qty 1

## 2017-04-02 MED ORDER — VITAMIN C 500 MG PO TABS
1000.0000 mg | ORAL_TABLET | Freq: Every day | ORAL | Status: DC
  Administered 2017-04-02 – 2017-04-04 (×3): 1000 mg via ORAL
  Filled 2017-04-02 (×3): qty 2

## 2017-04-02 MED ORDER — ONDANSETRON HCL 4 MG PO TABS: 4.0000 mg | ORAL_TABLET | Freq: Four times a day (QID) | ORAL | Status: DC | PRN

## 2017-04-02 MED ORDER — ENOXAPARIN SODIUM 40 MG/0.4ML ~~LOC~~ SOLN
40.0000 mg | SUBCUTANEOUS | Status: DC
  Administered 2017-04-02 – 2017-04-03 (×2): 40 mg via SUBCUTANEOUS
  Filled 2017-04-02 (×2): qty 0.4

## 2017-04-02 MED ORDER — B COMPLEX-C PO TABS
1.0000 | ORAL_TABLET | Freq: Every day | ORAL | Status: DC
  Administered 2017-04-02 – 2017-04-04 (×3): 1 via ORAL
  Filled 2017-04-02 (×3): qty 1

## 2017-04-02 MED ORDER — CALCIUM CITRATE PLUS/MAGNESIUM PO TABS: 2.0000 | ORAL_TABLET | Freq: Every day | ORAL | Status: DC

## 2017-04-02 MED ORDER — SODIUM CHLORIDE 0.9 % IV SOLN
INTRAVENOUS | Status: DC
  Administered 2017-04-02 – 2017-04-03 (×2): via INTRAVENOUS

## 2017-04-02 NOTE — ED Provider Notes (Signed)
[facility] Emergency Department Provider Note    First MD Initiated Contact with Patient 04/02/17 1042     (approximate)  I have reviewed the triage vital signs and the nursing notes.   HISTORY  Chief Complaint Urinary Tract Infection    HPI Allison Snyder is a 84 y.o. female with recent treatment for urinary tract infection presents to the ER for lightheadedness and dizziness when she stands up.  She presented today because she is at home getting up out of her couch and walking to the kitchen and felt very lightheaded like she was about to pass out.  She was able to stop or so before she felt.  Denies any chest pain or shortness of breath.  States she has had slightly decreased appetite over the past week.  Denies any symptoms of urinary tract infection.  No diarrhea.  No palpitations.  No numbness or tingling.  Past Medical History:  Diagnosis Date  . Asthma   . CHF (congestive heart failure) ([place])   . Gallbladder & bile duct stone with obstruction    issues off and on  . H/O tubal ligation   . History of appendectomy   . Hypoglycemic syndrome    Family History  Problem Relation Age of Onset  . Colon cancer Mother   . Diabetes Sister    Past Surgical History:  Procedure Laterality Date  . ABDOMINAL HYSTERECTOMY    . APPENDECTOMY    . TUBAL LIGATION     Patient Active Problem List   Diagnosis Date Noted  . Cognitive decline 02/23/2017  . Hyperglycemia 09/17/2016  . Episodic lightheadedness 09/17/2016  . Transient cerebral ischemia 05/22/2016  . Varicose veins of both lower extremities 04/07/2015  . Breast mass, left 12/11/2014  . Bilateral renal cysts 09/23/2014  . Asthma, mild persistent 07/27/2014  . Aortic atherosclerosis ([place]) 07/27/2014  . Neoplasm of uncertain behavior of skin 07/27/2014  . Impaired renal function 07/27/2014      Prior to Admission medications   Medication Sig Start Date End Date Taking? Authorizing  Provider  aspirin EC 81 MG tablet Take 1 tablet (81 mg total) by mouth daily. 05/21/16 05/21/17  Harvest Dark, MD  b complex vitamins capsule Take 1 capsule by mouth daily.    [provider]  budesonide (PULMICORT) 0.5 MG/2ML nebulizer solution Take 2 mLs (0.5 mg total) by nebulization 2 (two) times daily. Patient not taking: Reported on 03/24/2017 05/26/16   Glean Hess, MD  calcium-vitamin D 250-100 MG-UNIT tablet Take 1 tablet by mouth 2 (two) times daily.    [provider]  cephALEXin (KEFLEX) 500 MG capsule Take 1 capsule (500 mg total) by mouth 3 (three) times daily for 10 days. 03/24/17 04/03/17  Schuyler Amor, MD  Cholecalciferol (VITAMIN D3) 1000 units CAPS Take by mouth.    [provider]  Multiple Minerals-Vitamins (CALCIUM CITRATE PLUS/MAGNESIUM) TABS Take 2 tablets by mouth daily. 1,000-500 mg    [provider]  Omega-3 Fatty Acids (FISH OIL) 1000 MG CAPS Take 2,000 mg by mouth daily. Triple strength fish oil    [provider]  ondansetron (ZOFRAN ODT) 4 MG disintegrating tablet Take 1 tablet (4 mg total) by mouth every 6 (six) hours as needed for nausea or vomiting. 03/26/17   Delman Kitten, MD  Pyridoxine HCl (VITAMIN B6 PO) Take by mouth.    [provider]  Sennosides-Docusate Sodium (EX-LAX GENTLE STRENGTH PO) Take 6 capsules by mouth daily.  [provider]  vitamin A 10000 UNIT capsule Take 10,000 Units by mouth daily. Reported on 04/07/2015    [provider]  vitamin C (ASCORBIC ACID) 500 MG tablet Take 500 mg by mouth daily.    [provider]  vitamin E 400 UNIT capsule Take 1,200 Units by mouth daily.    [provider]    Allergies Levaquin [levofloxacin]; Prednisone; and Albuterol    Social History Social History   Tobacco Use  . Smoking status: Never Smoker  . Smokeless tobacco: Never Used  Substance Use Topics  . Alcohol use: No    Alcohol/week: 0.0 oz  .  Drug use: No    Review of Systems Patient denies headaches, rhinorrhea, blurry vision, numbness, shortness of breath, chest pain, edema, cough, abdominal pain, nausea, vomiting, diarrhea, dysuria, fevers, rashes or hallucinations unless otherwise stated above in HPI. ____________________________________________   PHYSICAL EXAM:  VITAL SIGNS: Vitals:   04/02/17 1028 04/02/17 1032  BP: (!) 138/56   Pulse:  91  Resp: 16   Temp: 97.6 F (36.4 C)   SpO2:  96%    Constitutional: Alert and oriented. Well appearing and in no acute distress. Eyes: Conjunctivae are normal.  Head: Atraumatic. Nose: No congestion/rhinnorhea. Mouth/Throat: Mucous membranes are moist.   Neck: No stridor. Painless ROM.  Cardiovascular: Normal rate, regular rhythm. Grossly normal heart sounds.  Good peripheral circulation. Respiratory: Normal respiratory effort.  No retractions. Lungs CTAB. Gastrointestinal: Soft and nontender. No distention. No abdominal bruits. No CVA tenderness. Genitourinary:  Musculoskeletal: No lower extremity tenderness nor edema.  No joint effusions. Neurologic:  Normal speech and language. No gross focal neurologic deficits are appreciated. No facial droop Skin:  Skin is warm, dry and intact. No rash noted. Psychiatric: Mood and affect are normal. Speech and behavior are normal.  ____________________________________________   LABS (all labs ordered are listed, but only abnormal results are displayed)  Results for orders placed or performed during the hospital encounter of 04/02/17 (from the past 24 hour(s))  Basic metabolic panel     Status: Abnormal   Collection Time: 04/02/17 10:34 AM  Result Value Ref Range   Sodium 140 135 - 145 mmol/L   Potassium 3.9 3.5 - 5.1 mmol/L   Chloride 101 101 - 111 mmol/L   CO2 30 22 - 32 mmol/L   Glucose, Bld 104 (H) 65 - 99 mg/dL   BUN 16 6 - 20 mg/dL   Creatinine, Ser 0.84 0.44 - 1.00 mg/dL   Calcium 9.3 8.9 - 10.3 mg/dL   GFR calc non  Af Amer >60 >60 mL/min   GFR calc Af Amer >60 >60 mL/min   Anion gap 9 5 - 15  CBC     Status: Abnormal   Collection Time: 04/02/17 10:34 AM  Result Value Ref Range   WBC 6.4 3.6 - 11.0 K/uL   RBC 5.55 (H) 3.80 - 5.20 MIL/uL   Hemoglobin 16.7 (H) 12.0 - 16.0 g/dL   HCT 50.0 (H) 35.0 - 47.0 %   MCV 90.1 80.0 - 100.0 fL   MCH 30.1 26.0 - 34.0 pg   MCHC 33.4 32.0 - 36.0 g/dL   RDW 13.0 11.5 - 14.5 %   Platelets 239 150 - 440 K/uL  Urinalysis, Complete w Microscopic     Status: Abnormal   Collection Time: 04/02/17 10:34 AM  Result Value Ref Range   Color, Urine YELLOW (A) YELLOW   APPearance HAZY (A) CLEAR   Specific Gravity, Urine 1.008  1.005 - 1.030   pH 6.0 5.0 - 8.0   Glucose, UA NEGATIVE NEGATIVE mg/dL   Hgb urine dipstick NEGATIVE NEGATIVE   Bilirubin Urine NEGATIVE NEGATIVE   Ketones, ur NEGATIVE NEGATIVE mg/dL   Protein, ur NEGATIVE NEGATIVE mg/dL   Nitrite NEGATIVE NEGATIVE   Leukocytes, UA NEGATIVE NEGATIVE   RBC / HPF 0-5 0 - 5 RBC/hpf   WBC, UA 0-5 0 - 5 WBC/hpf   Bacteria, UA NONE SEEN NONE SEEN   Squamous Epithelial / LPF 0-5 (A) NONE SEEN   Mucus PRESENT    Hyaline Casts, UA PRESENT    ____________________________________________  EKG My review and personal interpretation at Time: 10:40   Indication: lightheadedness  Rate: 110  Rhythm: afib Axis: left Other: rbbb, non speicific st changes, ____________________________________________  RADIOLOGY  I personally reviewed all radiographic images ordered to evaluate for the above acute complaints and reviewed radiology reports and findings.  These findings were personally discussed with the patient.  Please see medical record for radiology report.  ____________________________________________   PROCEDURES  Procedure(s) performed:  Procedures    Critical Care performed: no ____________________________________________   INITIAL IMPRESSION / ASSESSMENT AND PLAN / ED COURSE  Pertinent labs & imaging  results that were available during my care of the patient were reviewed by me and considered in my medical decision making (see chart for details).  DDX: Dehydration, orthostasis, dysrhythmia, ACS, heart failure, pneumonia, UTI, electrolyte abnormality  Emberley Kral is a 84 y.o. who presents to the ED with was as described above.  Patient does appear clinically dehydrated therefore we will give IV fluids.  Patient with orthostasis down to 78 systolic blood pressure and very symptomatic.  No evidence of UTI.  EKG does show evidence of probable atrial fibrillation versus variably conducted a flutter which does appear to be new.  No chest pain or shortness of breath.  Patient given IV fluids and still persistently hypotensive and unable to stand due to orthostasis.  Will give additional IV fluids and given her frailty factors and age will admit for additional IV fluids and further medical management.      ____________________________________________   FINAL CLINICAL IMPRESSION(S) / ED DIAGNOSES  Final diagnoses:  Weakness  Orthostasis      NEW MEDICATIONS STARTED DURING THIS VISIT:  New Prescriptions   No medications on file     Note:  This document was prepared using Dragon voice recognition software and may include unintentional dictation errors.    Merlyn Lot, MD 04/02/17 [phone]

## 2017-04-02 NOTE — ED Notes (Signed)
MD notified about pts orthostatic vital signs

## 2017-04-02 NOTE — ED Notes (Signed)
Pt unable to obtain urine sample at this time. Given water and will try again

## 2017-04-02 NOTE — ED Notes (Signed)
Urine sample colleted and send to the lab (12:07)

## 2017-04-02 NOTE — Progress Notes (Signed)
Pt. Arrived via stretcher,walked to bed with standby assist Pt. Alert and oriented x4. Tele applied, CCMD called and box verified. Second verifier was [employer], Therapist, sports. No skin issues noted. Skin assessed with Wyona Almas, RN. General room orientation given. Instruction on how to use call bell and ascom system given. Pt. Toileted. Granddaughter at bedside. Pt. Given sandwich box and water. Bed alarm on, call bell and brown phone within reach.

## 2017-04-02 NOTE — ED Triage Notes (Signed)
Pt brought to ED via [facility] from home for dizziness. Pt currently being treated for UTI, was seen in ED last week. Pt states that she has 1 dose of antibiotics left. Pt states that she is having dizziness when standing at times. Pt in NAD at this time.

## 2017-04-02 NOTE — H&P (Signed)
[place] at [place] NAME: Allison Snyder    MR#:  [id number]  DATE OF BIRTH:  1929-09-16  DATE OF ADMISSION:  04/02/2017  PRIMARY CARE PHYSICIAN: Glean Hess, MD   REQUESTING/REFERRING PHYSICIAN: Dr. Merlyn Lot  CHIEF COMPLAINT:   Chief Complaint  Patient presents with  . Urinary Tract Infection  Dizziness  HISTORY OF PRESENT ILLNESS:  Allison Snyder  is a 84 y.o. female with a known history of CHF, asthma, osteoporosis, recent urinary tract infection who presents to the hospital due to dizziness. Patient says that she has been feeling dizzy for a long period of time may be a few months but today she was unable to get up from a sitting position to a sitting position without feeling extremely dizzy. She therefore was brought to the ER for further evaluation. Patient was noted to be orthostatic here in the ER and also noted to be in new onset atrial fibrillation. She denies any chest pain, palpitations, nausea, vomiting, shortness of breath or any other associated symptoms. Patient received 1 L of IV fluids in the ER but despite getting fluids she continues to be orthostatic. Hospitalist services were contacted further treatment and evaluation.  PAST MEDICAL HISTORY:   Past Medical History:  Diagnosis Date  . Asthma   . CHF (congestive heart failure) ([place])   . Gallbladder & bile duct stone with obstruction    issues off and on  . H/O tubal ligation   . History of appendectomy   . Hypoglycemic syndrome     PAST SURGICAL HISTORY:   Past Surgical History:  Procedure Laterality Date  . ABDOMINAL HYSTERECTOMY    . APPENDECTOMY    . TUBAL LIGATION      SOCIAL HISTORY:   Social History   Tobacco Use  . Smoking status: Never Smoker  . Smokeless tobacco: Never Used  Substance Use Topics  . Alcohol use: No    Alcohol/week: 0.0 oz    FAMILY HISTORY:   Family History  Problem Relation Age of Onset  . Colon cancer Mother    . Tuberculosis Father   . Diabetes Sister     DRUG ALLERGIES:   Allergies  Allergen Reactions  . Levaquin [Levofloxacin] Shortness Of Breath  . Prednisone Shortness Of Breath  . Milk-Related Compounds Other (See Comments)    Patient states it causes bronchitis   . Albuterol Anxiety    REVIEW OF SYSTEMS:   Review of Systems  Constitutional: Negative for fever and weight loss.  HENT: Negative for congestion, nosebleeds and tinnitus.   Eyes: Negative for blurred vision, double vision and redness.  Respiratory: Negative for cough, hemoptysis and shortness of breath.   Cardiovascular: Negative for chest pain, orthopnea, leg swelling and PND.  Gastrointestinal: Negative for abdominal pain, diarrhea, melena, nausea and vomiting.  Genitourinary: Negative for dysuria, hematuria and urgency.  Musculoskeletal: Negative for falls and joint pain.  Neurological: Positive for dizziness and weakness. Negative for tingling, sensory change, focal weakness, seizures and headaches.  Endo/Heme/Allergies: Negative for polydipsia. Does not bruise/bleed easily.  Psychiatric/Behavioral: Negative for depression and memory loss. The patient is not nervous/anxious.     MEDICATIONS AT HOME:   Prior to Admission medications   Medication Sig Start Date End Date Taking? Authorizing Provider  aspirin EC 81 MG tablet Take 1 tablet (81 mg total) by mouth daily. 05/21/16 05/21/17 Yes PaduchowskiLennette Bihari, MD  b complex vitamins capsule Take 1 capsule by mouth daily.  Yes [provider]  calcium-vitamin D 250-100 MG-UNIT tablet Take 1 tablet by mouth 2 (two) times daily.   Yes [provider]  cephALEXin (KEFLEX) 500 MG capsule Take 1 capsule (500 mg total) by mouth 3 (three) times daily for 10 days. 03/24/17 04/03/17 Yes McShaneGerda Diss, MD  Cholecalciferol (VITAMIN D3) 5000 units TABS Take 5,000 Units by mouth daily.    Yes [provider]  Multiple Minerals-Vitamins (CALCIUM CITRATE  PLUS/MAGNESIUM) TABS Take 2 tablets by mouth daily. 1,000-500 mg   Yes [provider]  Omega-3 Fatty Acids (FISH OIL) 1000 MG CAPS Take 2,000 mg by mouth daily. Triple strength fish oil   Yes [provider]  Sennosides-Docusate Sodium (EX-LAX GENTLE STRENGTH PO) Take 6 capsules by mouth daily.   Yes [provider]  vitamin A 10000 UNIT capsule Take 10,000 Units by mouth daily. Reported on 04/07/2015   Yes [provider]  vitamin C (ASCORBIC ACID) 500 MG tablet Take 1,000 mg by mouth daily.    Yes [provider]  vitamin E 400 UNIT capsule Take 1,200 Units by mouth daily.   Yes [provider]  budesonide (PULMICORT) 0.5 MG/2ML nebulizer solution Take 2 mLs (0.5 mg total) by nebulization 2 (two) times daily. Patient not taking: Reported on 03/24/2017 05/26/16   Glean Hess, MD  ondansetron (ZOFRAN ODT) 4 MG disintegrating tablet Take 1 tablet (4 mg total) by mouth every 6 (six) hours as needed for nausea or vomiting. Patient not taking: Reported on 04/02/2017 03/26/17   Delman Kitten, MD      VITAL SIGNS:  Blood pressure (!) 139/55, pulse 89, temperature 97.6 F (36.4 C), temperature source Oral, resp. rate 13, height 5\' 4"  (1.626 m), weight 68 kg (150 lb), SpO2 97 %.  PHYSICAL EXAMINATION:  Physical Exam  GENERAL:  84 y.o.-year-old patient lying in the bed in no acute distress.  EYES: Pupils equal, round, reactive to light and accommodation. No scleral icterus. Extraocular muscles intact.  HEENT: Head atraumatic, normocephalic. Oropharynx and nasopharynx clear. No oropharyngeal erythema, moist oral mucosa  NECK:  Supple, no jugular venous distention. No thyroid enlargement, no tenderness.  LUNGS: Normal breath sounds bilaterally, no wheezing, rales, rhonchi. No use of accessory muscles of respiration.  CARDIOVASCULAR: S1, S2 Irregular. No murmurs, rubs, gallops, clicks.  ABDOMEN: Soft, nontender, nondistended. Bowel sounds present.  No organomegaly or mass.  EXTREMITIES: No pedal edema, cyanosis, or clubbing. + 2 pedal & radial pulses b/l.   NEUROLOGIC: Cranial nerves II through XII are intact. No focal Motor or sensory deficits appreciated b/l. Globally weak.  PSYCHIATRIC: The patient is alert and oriented x 3. SKIN: No obvious rash, lesion, or ulcer.   LABORATORY PANEL:   CBC Recent Labs  Lab 04/02/17 1034  WBC 6.4  HGB 16.7*  HCT 50.0*  PLT 239   ------------------------------------------------------------------------------------------------------------------  Chemistries  Recent Labs  Lab 04/02/17 1034  NA 140  K 3.9  CL 101  CO2 30  GLUCOSE 104*  BUN 16  CREATININE 0.84  CALCIUM 9.3   ------------------------------------------------------------------------------------------------------------------  Cardiac Enzymes Recent Labs  Lab 04/02/17 1034  TROPONINI 0.06*   ------------------------------------------------------------------------------------------------------------------  RADIOLOGY:  Dg Chest Portable 1 View  Result Date: 04/02/2017 CLINICAL DATA:  Patient with dizziness. EXAM: PORTABLE CHEST 1 VIEW COMPARISON:  Chest radiograph 03/24/2017. FINDINGS: Patient is rotated. Stable cardiomegaly. Minimal left basilar scarring/atelectasis. No pleural effusion or pneumothorax. IMPRESSION: Minimal left basilar scarring/atelectasis. Electronically Signed   By: Polly Cobia.D.  On: 04/02/2017 16:48     IMPRESSION AND PLAN:   84 year old female with past medical history of CHF, asthma, osteoporosis, recent urinary tract infection who presents to the hospital due to dizziness.  1. Orthostatic hypotension - this is the cause of patient's dizziness. The reason for the orthostasis remains unclear. Despite getting IV fluids patient continues to be orthostatic. -I'll check a cortisol, TSH level. Hydrate with IV fluids, repeat orthostatics in the morning. - start the pt. On low dose Midodrine.    2. New onset atrial fibrillation-rate controlled. -Observe on telemetry, cycle her cardiac markers. Cannot start rate controlling meds as patient is orthostatic. -Check echocardiogram, get a cardiology consult.  3. Elevated troponin-likely supply demand ischemia from A. fib and orthostasis. -Observe on telemetry, cycle her cardiac markers.  4. Recent urinary tract infection-presently urinalysis is negative. - cont. Keflex for 1 more day.   5. Osteoporosis - continue calcium and vitamin D supplements.    All the records are reviewed and case discussed with ED provider. Management plans discussed with the patient, family and they are in agreement.  CODE STATUS: Full code  TOTAL TIME TAKING CARE OF THIS PATIENT: 40 minutes.    Henreitta Leber M.D on 04/02/2017 at 5:26 PM  Between 7am to 6pm - Pager - [phone]  After 6pm go to [email or web address] - password [email or web address] [facility]  [facility]  Office  [phone]  CC: Primary care physician; Glean Hess, MD

## 2017-04-03 DIAGNOSIS — H8149 Vertigo of central origin, unspecified ear: Secondary | ICD-10-CM | POA: Diagnosis not present

## 2017-04-03 DIAGNOSIS — M810 Age-related osteoporosis without current pathological fracture: Secondary | ICD-10-CM | POA: Diagnosis not present

## 2017-04-03 DIAGNOSIS — R42 Dizziness and giddiness: Secondary | ICD-10-CM | POA: Diagnosis not present

## 2017-04-03 DIAGNOSIS — R0602 Shortness of breath: Secondary | ICD-10-CM | POA: Diagnosis not present

## 2017-04-03 DIAGNOSIS — I951 Orthostatic hypotension: Secondary | ICD-10-CM | POA: Diagnosis not present

## 2017-04-03 DIAGNOSIS — I4891 Unspecified atrial fibrillation: Secondary | ICD-10-CM | POA: Diagnosis not present

## 2017-04-03 DIAGNOSIS — I5020 Unspecified systolic (congestive) heart failure: Secondary | ICD-10-CM | POA: Diagnosis not present

## 2017-04-03 DIAGNOSIS — R748 Abnormal levels of other serum enzymes: Secondary | ICD-10-CM | POA: Diagnosis not present

## 2017-04-03 LAB — CORTISOL: CORTISOL PLASMA: 5.9 ug/dL

## 2017-04-03 LAB — TROPONIN I
TROPONIN I: 0.04 ng/mL — AB (ref <0.03)
Troponin I: 0.04 ng/mL (ref <0.03)

## 2017-04-03 MED ORDER — MIDODRINE HCL 5 MG PO TABS: 5.0000 mg | ORAL_TABLET | Freq: Three times a day (TID) | ORAL | 0 refills | Status: AC

## 2017-04-03 NOTE — Progress Notes (Signed)
[place] at [place] NAME: Allison Snyder    MR#:  [id number]  DATE OF BIRTH:  October 04, 1929  SUBJECTIVE:  CHIEF COMPLAINT:   Chief Complaint  Patient presents with  . Urinary Tract Infection   - Admitted with orthostatic hypotension. Still complains of dizziness and weakness. -Blood pressure is improving on midodrine  REVIEW OF SYSTEMS:  Review of Systems  Constitutional: Negative for chills, fever and malaise/fatigue.  HENT: Negative for congestion, hearing loss, nosebleeds and sinus pain.   Eyes: Negative for blurred vision and double vision.  Respiratory: Negative for cough, shortness of breath and wheezing.   Cardiovascular: Negative for chest pain, palpitations and leg swelling.  Gastrointestinal: Negative for abdominal pain, constipation, diarrhea, nausea and vomiting.  Genitourinary: Negative for dysuria and urgency.  Musculoskeletal: Positive for falls.  Neurological: Positive for dizziness. Negative for seizures and headaches.  Psychiatric/Behavioral: Negative for depression.    DRUG ALLERGIES:   Allergies  Allergen Reactions  . Levaquin [Levofloxacin] Shortness Of Breath  . Prednisone Shortness Of Breath  . Milk-Related Compounds Other (See Comments)    Patient states it causes bronchitis   . Albuterol Anxiety    VITALS:  Blood pressure (!) 113/49, pulse 83, temperature 97.8 F (36.6 C), temperature source Oral, resp. rate 16, height 5\' 4"  (1.626 m), weight 68.5 kg (151 lb 1.6 oz), SpO2 98 %.  PHYSICAL EXAMINATION:  Physical Exam  GENERAL:  84 y.o.-year-old patient lying in the bed with no acute distress.  EYES: Pupils equal, round, reactive to light and accommodation. No scleral icterus. Extraocular muscles intact.  HEENT: Head atraumatic, normocephalic. Oropharynx and nasopharynx clear.  NECK:  Supple, no jugular venous distention. No thyroid enlargement, no tenderness.  LUNGS: Normal breath sounds bilaterally,  no wheezing, rales,rhonchi or crepitation. No use of accessory muscles of respiration.  CARDIOVASCULAR: S1, S2 normal. No murmurs, rubs, or gallops.  ABDOMEN: Soft, nontender, nondistended. Bowel sounds present. No organomegaly or mass.  EXTREMITIES: No pedal edema, cyanosis, or clubbing.  NEUROLOGIC: Cranial nerves II through XII are intact. Muscle strength 5/5 in all extremities. Sensation intact. Gait not checked.  PSYCHIATRIC: The patient is alert and oriented x 3. Very slow while initiated to talk SKIN: No obvious rash, lesion, or ulcer.    LABORATORY PANEL:   CBC Recent Labs  Lab 04/02/17 1034  WBC 6.4  HGB 16.7*  HCT 50.0*  PLT 239   ------------------------------------------------------------------------------------------------------------------  Chemistries  Recent Labs  Lab 04/02/17 1034  NA 140  K 3.9  CL 101  CO2 30  GLUCOSE 104*  BUN 16  CREATININE 0.84  CALCIUM 9.3   ------------------------------------------------------------------------------------------------------------------  Cardiac Enzymes Recent Labs  Lab 04/03/17 0436  TROPONINI 0.04*   ------------------------------------------------------------------------------------------------------------------  RADIOLOGY:  Dg Chest Portable 1 View  Result Date: 04/02/2017 CLINICAL DATA:  Patient with dizziness. EXAM: PORTABLE CHEST 1 VIEW COMPARISON:  Chest radiograph 03/24/2017. FINDINGS: Patient is rotated. Stable cardiomegaly. Minimal left basilar scarring/atelectasis. No pleural effusion or pneumothorax. IMPRESSION: Minimal left basilar scarring/atelectasis. Electronically Signed   By: Lovey Newcomer M.D.   On: 04/02/2017 16:48    EKG:   Orders placed or performed during the hospital encounter of 04/02/17  . ED EKG  . ED EKG  . EKG 12-Lead  . EKG 12-Lead    ASSESSMENT AND PLAN:   84 year old female with past medical history significant for diastolic CHF, osteoporosis, asthma presents to  hospital secondary to dizziness and noted to be hypotensive.  1. Orthostatic  hypertension-could be hypovolemic. -Continue to measure blood pressure changes. On IV fluids and also midodrine. -Once blood pressure improves, ambulate. -TSH within normal limits.  2. Atrial fibrillation-known history of paroxysmal atrial fibrillation. Patient did mention that her PCP in the past several years ago did mention that she has irregular heartbeat. -Rate is well controlled. Remains in A. fib -Discussed about anticoagulation and patient is not too sure about starting at this time. -Echocardiogram pending and cardiology consulted. -Continue aspirin  3. Elevated troponin-demand ischemia. Plateaued  4. DVT prophylaxis-Lovenox   Ambulate, at baseline does not use any assistive devices.   All the records are reviewed and case discussed with Care Management/Social Workerr. Management plans discussed with the patient, family and they are in agreement.  CODE STATUS: Full Code  TOTAL TIME TAKING CARE OF THIS PATIENT: 38 minutes.   POSSIBLE D/C TODAY OR TOMORROW, DEPENDING ON CLINICAL CONDITION.   Gladstone Lighter M.D on 04/03/2017 at 11:49 AM  Between 7am to 6pm - Pager - [phone]  After 6pm go to [email or web address] - password [email or web address] [place] [facility]  Office  [phone]  CC: Primary care physician; Glean Hess, MD

## 2017-04-03 NOTE — Consult Note (Signed)
Reason for Consult: Atrial fibrillation elevated troponin Referring Physician: Dr Lonell Grandchild primary  Allison Snyder is an 84 y.o. female.  HPI: Patient presents with a history of dizziness found to have a urinary tract infection incidentally was found to have atrial fibrillation controlled rate which is new as well as elevated troponin.  Patient has history of congestive heart failure asthma osteoporosis now with recent urinary tract infection and dizziness was found to be orthostatic denies any buccal spells or syncope no previous cardiac history denies palpitations or tachycardia.  Patient received fluids in the emergency room to help with orthostatic hypotension and feels much better the patient refuses additional medications.  Past Medical History:  Diagnosis Date  . Asthma   . CHF (congestive heart failure) ([place])   . Gallbladder & bile duct stone with obstruction    issues off and on  . H/O tubal ligation   . History of appendectomy   . Hypoglycemic syndrome     Past Surgical History:  Procedure Laterality Date  . ABDOMINAL HYSTERECTOMY    . APPENDECTOMY    . TUBAL LIGATION      Family History  Problem Relation Age of Onset  . Colon cancer Mother   . Tuberculosis Father   . Diabetes Sister     Social History:  reports that  has never smoked. she has never used smokeless tobacco. She reports that she does not drink alcohol or use drugs.  Allergies:  Allergies  Allergen Reactions  . Levaquin [Levofloxacin] Shortness Of Breath  . Prednisone Shortness Of Breath  . Milk-Related Compounds Other (See Comments)    Patient states it causes bronchitis   . Albuterol Anxiety    Medications: Previous medication list was reviewed please see H&P for complete list  Results for orders placed or performed during the hospital encounter of 04/02/17 (from the past 48 hour(s))  Basic metabolic panel     Status: Abnormal   Collection Time: 04/02/17 10:34 AM  Result Value Ref  Range   Sodium 140 135 - 145 mmol/L   Potassium 3.9 3.5 - 5.1 mmol/L   Chloride 101 101 - 111 mmol/L   CO2 30 22 - 32 mmol/L   Glucose, Bld 104 (H) 65 - 99 mg/dL   BUN 16 6 - 20 mg/dL   Creatinine, Ser 0.84 0.44 - 1.00 mg/dL   Calcium 9.3 8.9 - 10.3 mg/dL   GFR calc non Af Amer >60 >60 mL/min   GFR calc Af Amer >60 >60 mL/min    Comment: (NOTE) The eGFR has been calculated using the CKD EPI equation. This calculation has not been validated in all clinical situations. eGFR's persistently <60 mL/min signify possible Chronic Kidney Disease.    Anion gap 9 5 - 15    Comment: Performed at [facility], [address] [place], [place] [address]  CBC     Status: Abnormal   Collection Time: 04/02/17 10:34 AM  Result Value Ref Range   WBC 6.4 3.6 - 11.0 K/uL   RBC 5.55 (H) 3.80 - 5.20 MIL/uL   Hemoglobin 16.7 (H) 12.0 - 16.0 g/dL   HCT 50.0 (H) 35.0 - 47.0 %   MCV 90.1 80.0 - 100.0 fL   MCH 30.1 26.0 - 34.0 pg   MCHC 33.4 32.0 - 36.0 g/dL   RDW 13.0 11.5 - 14.5 %   Platelets 239 150 - 440 K/uL    Comment: Performed at [facility], [address] [place], [place]  [address]  Urinalysis, Complete w Microscopic     Status: Abnormal   Collection Time: 04/02/17 10:34 AM  Result Value Ref Range   Color, Urine YELLOW (A) YELLOW   APPearance HAZY (A) CLEAR   Specific Gravity, Urine 1.008 1.005 - 1.030   pH 6.0 5.0 - 8.0   Glucose, UA NEGATIVE NEGATIVE mg/dL   Hgb urine dipstick NEGATIVE NEGATIVE   Bilirubin Urine NEGATIVE NEGATIVE   Ketones, ur NEGATIVE NEGATIVE mg/dL   Protein, ur NEGATIVE NEGATIVE mg/dL   Nitrite NEGATIVE NEGATIVE   Leukocytes, UA NEGATIVE NEGATIVE   RBC / HPF 0-5 0 - 5 RBC/hpf   WBC, UA 0-5 0 - 5 WBC/hpf   Bacteria, UA NONE SEEN NONE SEEN   Squamous Epithelial / LPF 0-5 (A) NONE SEEN   Mucus PRESENT    Hyaline Casts, UA PRESENT     Comment: Performed at [facility], [address] [place], [place] [address]  Troponin I      Status: Abnormal   Collection Time: 04/02/17 10:34 AM  Result Value Ref Range   Troponin I 0.06 (HH) <0.03 ng/mL    Comment: CRITICAL RESULT CALLED TO, READ BACK BY AND VERIFIED WITH STEVEN JONES ON 04/02/17 AT 1642 QSD Performed at [facility], [address] [place], [place] [address]   Cortisol     Status: None   Collection Time: 04/02/17  8:39 PM  Result Value Ref Range   Cortisol, Plasma 5.9 ug/dL    Comment: (NOTE) AM    6.7 - 22.6 ug/dL PM   <10.0       ug/dL Performed at [facility] [address], [place], [place] [address]   TSH     Status: None   Collection Time: 04/02/17  8:39 PM  Result Value Ref Range   TSH 1.282 0.350 - 4.500 uIU/mL    Comment: Performed by a 3rd Generation assay with a functional sensitivity of <=0.01 uIU/mL. Performed at [facility], [address] [place], [place] [address]   Troponin I     Status: Abnormal   Collection Time: 04/02/17  8:39 PM  Result Value Ref Range   Troponin I 0.05 (HH) <0.03 ng/mL    Comment: CRITICAL VALUE NOTED. VALUE IS CONSISTENT WITH PREVIOUSLY REPORTED/CALLED VALUE RWW Performed at [facility], [address] [place], [place] [address]   Troponin I     Status: Abnormal   Collection Time: 04/02/17 11:53 PM  Result Value Ref Range   Troponin I 0.04 (HH) <0.03 ng/mL    Comment: CRITICAL VALUE NOTED. VALUE IS CONSISTENT WITH PREVIOUSLY REPORTED/CALLED VALUE.PMH Performed at [facility], [address] [place], [place] [address]   Troponin I     Status: Abnormal   Collection Time: 04/03/17  4:36 AM  Result Value Ref Range   Troponin I 0.04 (HH) <0.03 ng/mL    Comment: CRITICAL VALUE NOTED. VALUE IS CONSISTENT WITH PREVIOUSLY REPORTED/CALLED VALUE.PMH Performed at [facility], [address] [place],  [address]     Dg Chest Portable 1 View  Result Date: 04/02/2017 CLINICAL DATA:  Patient with dizziness. EXAM: PORTABLE CHEST 1 VIEW  COMPARISON:  Chest radiograph 03/24/2017. FINDINGS: Patient is rotated. Stable cardiomegaly. Minimal left basilar scarring/atelectasis. No pleural effusion or pneumothorax. IMPRESSION: Minimal left basilar scarring/atelectasis. Electronically Signed   By: Lovey Newcomer M.D.   On: 04/02/2017 16:48    Review of Systems  Constitutional: Positive for malaise/fatigue.  HENT: Positive for  hearing loss.   Eyes: Negative.   Respiratory: Negative.   Cardiovascular: Positive for palpitations.  Gastrointestinal: Negative.   Genitourinary: Negative.   Musculoskeletal: Negative.   Skin: Negative.   Neurological: Positive for dizziness and weakness.  Endo/Heme/Allergies: Negative.   Psychiatric/Behavioral: Negative.    Blood pressure (!) 149/80, pulse 68, temperature 97.8 F (36.6 C), temperature source Oral, resp. rate 16, height 5' 4"  (1.626 m), weight 151 lb 1.6 oz (68.5 kg), SpO2 98 %. Physical Exam  Nursing note and vitals reviewed. Constitutional: She is oriented to person, place, and time. She appears well-developed and well-nourished.  HENT:  Head: Normocephalic and atraumatic.  Eyes: Conjunctivae and EOM are normal. Pupils are equal, round, and reactive to light.  Neck: Normal range of motion. Neck supple.  Cardiovascular: Normal rate, S1 normal, S2 normal and normal pulses. An irregularly irregular rhythm present.  Respiratory: Effort normal and breath sounds normal.  GI: Soft. Bowel sounds are normal.  Musculoskeletal: Normal range of motion.  Neurological: She is alert and oriented to person, place, and time. She has normal reflexes.  Skin: Skin is warm and dry.  Psychiatric: She has a normal mood and affect.    Assessment/Plan: Atrial fibrillation, New Right bundle branch block Vertigo Urinary tract infection Asthma Congestive of heart failure Noncompliance Orthostatic hypotension Elevated troponin . Plan Agree with admit to telemetry EKGs and troponins Defer  anticoagulation for A. Fib Recommend aspirin therapy for now Patient refuses Coumadin because of noncompliance Recommend echocardiogram for further assessment Gentle hydration for possible orthostatic hypotension Consider mididrine for blood pressure support Agree with antibiotic for urinary tract infection Consider meclizine for possible vertigo symptoms Recommend conservative cardiac care  Kilani Joffe D Canon Gola 04/03/2017, 6:06 PM

## 2017-04-04 ENCOUNTER — Telehealth: Payer: Self-pay

## 2017-04-04 ENCOUNTER — Observation Stay (HOSPITAL_BASED_OUTPATIENT_CLINIC_OR_DEPARTMENT_OTHER)
Admit: 2017-04-04 | Discharge: 2017-04-04 | Disposition: A | Discharge status: Home / Self Care | Payer: Medicare HMO | Attending: Specialist | Admitting: Specialist

## 2017-04-04 DIAGNOSIS — I351 Nonrheumatic aortic (valve) insufficiency: Secondary | ICD-10-CM | POA: Diagnosis not present

## 2017-04-04 DIAGNOSIS — R748 Abnormal levels of other serum enzymes: Secondary | ICD-10-CM | POA: Diagnosis not present

## 2017-04-04 DIAGNOSIS — I340 Nonrheumatic mitral (valve) insufficiency: Secondary | ICD-10-CM | POA: Diagnosis not present

## 2017-04-04 DIAGNOSIS — I951 Orthostatic hypotension: Secondary | ICD-10-CM | POA: Diagnosis not present

## 2017-04-04 DIAGNOSIS — M810 Age-related osteoporosis without current pathological fracture: Secondary | ICD-10-CM | POA: Diagnosis not present

## 2017-04-04 DIAGNOSIS — I4891 Unspecified atrial fibrillation: Secondary | ICD-10-CM | POA: Diagnosis not present

## 2017-04-04 LAB — ECHOCARDIOGRAM COMPLETE
Height: 64 in
Weight: 2417.6 oz

## 2017-04-04 NOTE — Discharge Summary (Signed)
[facility] [place] NAME: Allison Snyder    MR#:  [id number]  DATE OF BIRTH:  [birth year]  DATE OF ADMISSION:  04/02/2017   ADMITTING PHYSICIAN: Henreitta Leber, MD  DATE OF DISCHARGE: 04/04/17  PRIMARY CARE PHYSICIAN: Glean Hess, MD   ADMISSION DIAGNOSIS:   Weakness [R53.1] Orthostasis [I95.1]  DISCHARGE DIAGNOSIS:   Active Problems:   Orthostatic hypotension   SECONDARY DIAGNOSIS:   Past Medical History:  Diagnosis Date  . Asthma   . CHF (congestive heart failure) ([place])   . Gallbladder & bile duct stone with obstruction    issues off and on  . H/O tubal ligation   . History of appendectomy   . Hypoglycemic syndrome     HOSPITAL COURSE:   84 year old female with past medical history significant for diastolic CHF, osteoporosis, asthma presents to hospital secondary to dizziness and noted to be hypotensive.  1. Orthostatic hypotension- secondary to hypovolemia and postural drop. -Improved with IV fluids and also midodrine. -Will discharge on midodrine. -Physical therapy recommended home health. Patient also given instructions about taking time to walk after standing up. -Continue to measure blood pressure changes. . -TSH within normal limits.  2. Atrial fibrillation-known history of paroxysmal atrial fibrillation. Patient did mention that her PCP in the past several years ago did mention that she has irregular heartbeat. -Rate is well controlled. Remains in A. Fib -Will hold off on any beta blockers due to her low blood pressure  -Discussed about anticoagulation and patient is not too sure about starting at this time. no warfarin or other anticoagulants at discharge. Also high risk for falls -Echocardiogram pending and cardiology consulted. -Continue aspirin  3. Elevated troponin-demand ischemia. Plateaued  no further cardiac workup   .Discharge home with home health today   DISCHARGE CONDITIONS:    Guarded  CONSULTS OBTAINED:   Treatment Team:  Yolonda Kida, MD  DRUG ALLERGIES:   Allergies  Allergen Reactions  . Levaquin [Levofloxacin] Shortness Of Breath  . Prednisone Shortness Of Breath  . Milk-Related Compounds Other (See Comments)    Patient states it causes bronchitis   . Albuterol Anxiety   DISCHARGE MEDICATIONS:   Allergies as of 04/04/2017      Reactions   Levaquin [levofloxacin] Shortness Of Breath   Prednisone Shortness Of Breath   Milk-related Compounds Other (See Comments)   Patient states it causes bronchitis    Albuterol Anxiety      Medication List    STOP taking these medications   budesonide 0.5 MG/2ML nebulizer solution Commonly known as:  PULMICORT   cephALEXin 500 MG capsule Commonly known as:  KEFLEX   ondansetron 4 MG disintegrating tablet Commonly known as:  ZOFRAN ODT     TAKE these medications   aspirin EC 81 MG tablet Take 1 tablet (81 mg total) by mouth daily.   b complex vitamins capsule Take 1 capsule by mouth daily.   CALCIUM CITRATE PLUS/MAGNESIUM Tabs Take 2 tablets by mouth daily. 1,000-500 mg   calcium-vitamin D 250-100 MG-UNIT tablet Take 1 tablet by mouth 2 (two) times daily.   EX-LAX GENTLE STRENGTH PO Take 6 capsules by mouth daily.   Fish Oil 1000 MG Caps Take 2,000 mg by mouth daily. Triple strength fish oil   midodrine 5 MG tablet Commonly known as:  PROAMATINE Take 1 tablet (5 mg total) by mouth 3 (three) times daily with meals.   vitamin A 10000 UNIT capsule Take  10,000 Units by mouth daily. Reported on 04/07/2015   vitamin C 500 MG tablet Commonly known as:  ASCORBIC ACID Take 1,000 mg by mouth daily.   Vitamin D3 5000 units Tabs Take 5,000 Units by mouth daily.   vitamin E 400 UNIT capsule Take 1,200 Units by mouth daily.        DISCHARGE INSTRUCTIONS:   1. PCP f/u in 1 week  DIET:   Cardiac diet  ACTIVITY:   Activity as tolerated  OXYGEN:   Home Oxygen: No.   Oxygen Delivery: room air  DISCHARGE LOCATION:   home   If you experience worsening of your admission symptoms, develop shortness of breath, life threatening emergency, suicidal or homicidal thoughts you must seek medical attention immediately by calling 911 or calling your MD immediately  if symptoms less severe.  You Must read complete instructions/literature along with all the possible adverse reactions/side effects for all the Medicines you take and that have been prescribed to you. Take any new Medicines after you have completely understood and accpet all the possible adverse reactions/side effects.   Please note  You were cared for by a hospitalist during your hospital stay. If you have any questions about your discharge medications or the care you received while you were in the hospital after you are discharged, you can call the unit and asked to speak with the hospitalist on call if the hospitalist that took care of you is not available. Once you are discharged, your primary care physician will handle any further medical issues. Please note that NO REFILLS for any discharge medications will be authorized once you are discharged, as it is imperative that you return to your primary care physician (or establish a relationship with a primary care physician if you do not have one) for your aftercare needs so that they can reassess your need for medications and monitor your lab values.    On the day of Discharge:  VITAL SIGNS:   Blood pressure (!) 118/52, pulse (!) 51, temperature 98.4 F (36.9 C), resp. rate 18, height 5\' 4"  (1.626 m), weight 68.5 kg (151 lb 1.6 oz), SpO2 99 %.  PHYSICAL EXAMINATION:     GENERAL:  84 y.o.-year-old patient lying in the bed with no acute distress.  EYES: Pupils equal, round, reactive to light and accommodation. No scleral icterus. Extraocular muscles intact.  HEENT: Head atraumatic, normocephalic. Oropharynx and nasopharynx clear.  NECK:  Supple, no  jugular venous distention. No thyroid enlargement, no tenderness.  LUNGS: Normal breath sounds bilaterally, no wheezing, rales,rhonchi or crepitation. No use of accessory muscles of respiration.  CARDIOVASCULAR: S1, S2 normal. No murmurs, rubs, or gallops.  ABDOMEN: Soft, nontender, nondistended. Bowel sounds present. No organomegaly or mass.  EXTREMITIES: No pedal edema, cyanosis, or clubbing.  NEUROLOGIC: Cranial nerves II through XII are intact. Muscle strength 5/5 in all extremities. Sensation intact. Gait not checked.  PSYCHIATRIC: The patient is alert and oriented x 3. Very slow while initiated to talk SKIN: No obvious rash, lesion, or ulcer.     DATA REVIEW:   CBC Recent Labs  Lab 04/02/17 1034  WBC 6.4  HGB 16.7*  HCT 50.0*  PLT 239    Chemistries  Recent Labs  Lab 04/02/17 1034  NA 140  K 3.9  CL 101  CO2 30  GLUCOSE 104*  BUN 16  CREATININE 0.84  CALCIUM 9.3     Microbiology Results  Results for orders placed or performed during the hospital encounter of 03/24/17  Urine culture     Status: Abnormal   Collection Time: 03/24/17  4:11 PM  Result Value Ref Range Status   Specimen Description   Final    URINE, RANDOM Performed at [facility], [address], [place], [place] [address]    Special Requests   Final    NONE Performed at [facility], [address] [place], [place] [address]    Culture MULTIPLE SPECIES PRESENT, SUGGEST RECOLLECTION (A)  Final   Report Status 03/26/2017 FINAL  Final    RADIOLOGY:  No results found.   Management plans discussed with the patient, family and they are in agreement.  CODE STATUS:     Code Status Orders  (From admission, onward)        Start     Ordered   04/02/17 2020  Full code  Continuous     04/02/17 2019    Code Status History    Date Active Date Inactive Code Status Order ID Comments User Context   02/20/2015 20:32 02/21/2015 19:07 Full Code [id number]  Hower, Aaron Mose, MD  ED    Advance Directive Documentation     Most Recent Value  Type of Advance Directive  Living will  Pre-existing out of facility DNR order (yellow form or pink MOST form)  No data  "MOST" Form in Place?  No data      TOTAL TIME TAKING CARE OF THIS PATIENT: 38 minutes.    Gladstone Lighter M.D on 04/04/2017 at 9:19 AM  Between 7am to 6pm - Pager - [phone]  After 6pm go to [email or web address] - Proofreader  [facility]  Office  [phone]  CC: Primary care physician; Glean Hess, MD   Note: This dictation was prepared with Dragon dictation along with smaller phrase technology. Any transcriptional errors that result from this process are unintentional.

## 2017-04-04 NOTE — Progress Notes (Signed)
*  PRELIMINARY RESULTS* Echocardiogram 2D Echocardiogram has been performed.  Allison Snyder 04/04/2017, 11:03 AM

## 2017-04-04 NOTE — Evaluation (Signed)
Physical Therapy Evaluation Patient Details Name: Allison Snyder MRN: [id number] DOB: [birth year] Today's Date: 04/04/2017   History of Present Illness  Pt admitted for orthostatic hypotension with complaints of dizziness. History includes CHF, asthma, and recent UTI. Pt also self reports that she has trouble with her memory. Pt has been positive orthostatic 146/69 -> 118/52 supine->stand.   Clinical Impression  Pt is a pleasant 84 year old female who was admitted for orthostatic hypotension. Pt reports no dizziness this date, still transitions quickly with OOB mobility. Encouraged pt to take time and stand prior to ambulation to prevent dizziness. Pt performs bed mobility/transfer with independence and ambulation with cga and RW. Pt demonstrates deficits with strength/endurance/safety awareness. Pt reluctant to let family assist her at home, however admits to memory problems. Once in room, pt left RW behind and held onto furniture for ambulation. Educated on safety awareness and to use RW for mobility to prevent LOB. Would benefit from skilled PT to address above deficits and promote optimal return to PLOF. Recommend transition to [place] upon discharge from acute hospitalization.       Follow Up Recommendations Home health PT    Equipment Recommendations  Rolling walker with 5" wheels    Recommendations for Other Services       Precautions / Restrictions Precautions Precautions: Fall Restrictions Weight Bearing Restrictions: No      Mobility  Bed Mobility Overal bed mobility: Independent             General bed mobility comments: safe technique, however moves quickly, encouraged to take time to prevent dizziness  Transfers Overall transfer level: Independent Equipment used: None             General transfer comment: upright posture noted  Ambulation/Gait Ambulation/Gait assistance: Min guard Ambulation Distance (Feet): 100 Feet Assistive device: Rolling walker  (2 wheeled) Gait Pattern/deviations: Step-through pattern     General Gait Details: ambulated with slow speed. UPright posture noted, however needs cues to look forward and not at feet. Once in room, took RW away and pt holds onto furniture for balance.  Stairs            Wheelchair Mobility    Modified Rankin (Stroke Patients Only)       Balance Overall balance assessment: Modified Independent                                           Pertinent Vitals/Pain Pain Assessment: No/denies pain    Home Living Family/patient expects to be discharged to:: Private residence Living Arrangements: Alone Available Help at Discharge: Family(has daughter and neighors to check on her) Type of Home: House Home Access: Stairs to enter Entrance Stairs-Rails: Can reach both Entrance Stairs-Number of Steps: 2 Home Layout: One level Home Equipment: Walker - 4 wheels Additional Comments: per pt, daughter wants her to stay with her, however pt prefers to dc alone    Prior Function Level of Independence: Independent         Comments: reports no falls, she says she ambulates holding onto furniture     Hand Dominance        Extremity/Trunk Assessment   Upper Extremity Assessment Upper Extremity Assessment: Generalized weakness(B UE grossly 4/5)    Lower Extremity Assessment Lower Extremity Assessment: Generalized weakness(L LE grossly 3+/5; R LE grossly 4/5)       Communication   Communication:  No difficulties  Cognition Arousal/Alertness: Awake/alert Behavior During Therapy: WFL for tasks assessed/performed Overall Cognitive Status: Within Functional Limits for tasks assessed                                        General Comments      Exercises Other Exercises Other Exercises: supine ther-ex performed on B LE/UE including ankle pumps, SLRs, and shoulder flexion. All ther-ex performed x 10 reps with supervision.   Assessment/Plan     PT Assessment Patient needs continued PT services  PT Problem List Decreased strength;Decreased activity tolerance;Decreased balance;Decreased mobility;Decreased knowledge of use of DME;Decreased safety awareness       PT Treatment Interventions Gait training;Stair training;DME instruction;Therapeutic exercise    PT Goals (Current goals can be found in the Care Plan section)  Acute Rehab PT Goals Patient Stated Goal: to go home and brush teeth PT Goal Formulation: With patient Time For Goal Achievement: 04/18/17 Potential to Achieve Goals: Good    Frequency Min 2X/week   Barriers to discharge        Co-evaluation               AM-PAC PT "6 Clicks" Daily Activity  Outcome Measure Difficulty turning over in bed (including adjusting bedclothes, sheets and blankets)?: None Difficulty moving from lying on back to sitting on the side of the bed? : None Difficulty sitting down on and standing up from a chair with arms (e.g., wheelchair, bedside commode, etc,.)?: None Help needed moving to and from a bed to chair (including a wheelchair)?: A Little Help needed walking in hospital room?: A Little Help needed climbing 3-5 steps with a railing? : A Little 6 Click Score: 21    End of Session Equipment Utilized During Treatment: Gait belt Activity Tolerance: Patient tolerated treatment well Patient left: in bed;with bed alarm set Nurse Communication: Mobility status PT Visit Diagnosis: Unsteadiness on feet (R26.81);Muscle weakness (generalized) (M62.81);Difficulty in walking, not elsewhere classified (R26.2)    Time: 1610-9604 PT Time Calculation (min) (ACUTE ONLY): 19 min   Charges:   PT Evaluation $PT Eval Low Complexity: 1 Low PT Treatments $Therapeutic Exercise: 8-22 mins   PT G Codes:        Greggory Stallion, PT, DPT [phone]   Advika Mclelland 04/04/2017, 10:33 AM

## 2017-04-04 NOTE — Telephone Encounter (Signed)
Transition Care Management Follow-Up Telephone Call   Date discharged and where: 04/04/17 from [facility]  How have you been since you were released from the hospital? Feeling much better. States she took her shower today and is currently in the process of "rolling her hair".   Denies having any weakness or dizziness since d/c. States he has not been monitoring her B/P since d/c. Education provided re: the importance of monitoring her B/P's. Verbalized acceptance and understanding. Also reminded about the importance of staying hydrated. Recommended to drink at least 6-8 8oz of fluid per day  Also states she has not yet picked up her Rx from her pharmacy for Midodrine. States she is in the process of "getting ready" so she can pick it up today.  Because she was discharged today, pt states she has not yet received a call from [facility]. Given her most recent fall, pt was advised to call Case Management at [facility] if she does not receive a call within the next 24 hours re: arrangements for [facility]. Verbalized acceptance and understanding.  Any patient concerns? No concerns voiced at this time.  Items Reviewed:   Meds: Verified. Reminded pt to pick up Rx for Midodrine. Verbalized acceptance and understanding.  Allergies: Verified  Dietary Changes Reviewed: Confirmed she is following a cardiac diet. Pt verbalized understanding of her dietary restrictions.  Functional Questionnaire:  Independent = I Dependent = D  ADLs:   Dressing- I    Eating- I   Maintaining continence- I   Transferring- I   Transportation- I   Meal Prep- I   Managing Meds- I  Confirmed importance and Date/Time of follow-up visits scheduled: Confirmed f/u with Dr. Army Melia on 04/13/17 @ 2:30pm Denied needing assistance with transportation arrangements. States she will be transporting herself to this appointment.  Confirmed with patient if condition worsens to call PCP or go to the Emergency Dept. Patient was given office number and  encouraged to call back with questions or concerns: Verbalized acceptance and understanding.

## 2017-04-04 NOTE — Discharge Planning (Signed)
Discharge instructions reviewed with pt. Pt verbalizes understanding. Pt ready for discharge home. 

## 2017-04-05 ENCOUNTER — Other Ambulatory Visit: Payer: Self-pay

## 2017-04-05 ENCOUNTER — Emergency Department
Admission: EM | Admit: 2017-04-05 | Discharge: 2017-04-05 | Disposition: A | Discharge status: Home / Self Care | Payer: Medicare HMO | Attending: Emergency Medicine | Admitting: Emergency Medicine

## 2017-04-05 ENCOUNTER — Encounter: Payer: Self-pay | Admitting: Emergency Medicine

## 2017-04-05 DIAGNOSIS — I509 Heart failure, unspecified: Secondary | ICD-10-CM | POA: Insufficient documentation

## 2017-04-05 DIAGNOSIS — Z79899 Other long term (current) drug therapy: Secondary | ICD-10-CM | POA: Insufficient documentation

## 2017-04-05 DIAGNOSIS — Z7982 Long term (current) use of aspirin: Secondary | ICD-10-CM | POA: Insufficient documentation

## 2017-04-05 DIAGNOSIS — J45909 Unspecified asthma, uncomplicated: Secondary | ICD-10-CM | POA: Diagnosis not present

## 2017-04-05 DIAGNOSIS — Z8673 Personal history of transient ischemic attack (TIA), and cerebral infarction without residual deficits: Secondary | ICD-10-CM | POA: Diagnosis not present

## 2017-04-05 DIAGNOSIS — R42 Dizziness and giddiness: Secondary | ICD-10-CM

## 2017-04-05 DIAGNOSIS — E876 Hypokalemia: Secondary | ICD-10-CM | POA: Diagnosis not present

## 2017-04-05 LAB — CBC
HEMATOCRIT: 44.8 % (ref 35.0–47.0)
HEMOGLOBIN: 15.2 g/dL (ref 12.0–16.0)
MCH: 30.4 pg (ref 26.0–34.0)
MCHC: 34 g/dL (ref 32.0–36.0)
MCV: 89.4 fL (ref 80.0–100.0)
Platelets: 270 10*3/uL (ref 150–440)
RBC: 5.01 MIL/uL (ref 3.80–5.20)
RDW: 13.2 % (ref 11.5–14.5)
WBC: 6.1 10*3/uL (ref 3.6–11.0)

## 2017-04-05 LAB — URINALYSIS, COMPLETE (UACMP) WITH MICROSCOPIC
BACTERIA UA: NONE SEEN
BILIRUBIN URINE: NEGATIVE
Glucose, UA: NEGATIVE mg/dL
HGB URINE DIPSTICK: NEGATIVE
KETONES UR: NEGATIVE mg/dL
LEUKOCYTES UA: NEGATIVE
Nitrite: NEGATIVE
PROTEIN: NEGATIVE mg/dL
RBC / HPF: NONE SEEN RBC/hpf (ref 0–5)
Specific Gravity, Urine: 1.006 (ref 1.005–1.030)
pH: 7 (ref 5.0–8.0)

## 2017-04-05 LAB — BASIC METABOLIC PANEL
ANION GAP: 8 (ref 5–15)
BUN: 12 mg/dL (ref 6–20)
CO2: 31 mmol/L (ref 22–32)
Calcium: 8.7 mg/dL — ABNORMAL LOW (ref 8.9–10.3)
Chloride: 100 mmol/L — ABNORMAL LOW (ref 101–111)
Creatinine, Ser: 0.78 mg/dL (ref 0.44–1.00)
GFR calc Af Amer: >60 mL/min (ref >60)
GFR calc non Af Amer: >60 mL/min (ref >60)
GLUCOSE: 113 mg/dL — AB (ref 65–99)
POTASSIUM: 3.4 mmol/L — AB (ref 3.5–5.1)
Sodium: 139 mmol/L (ref 135–145)

## 2017-04-05 LAB — GLUCOSE, CAPILLARY: GLUCOSE-CAPILLARY: 106 mg/dL — AB (ref 65–99)

## 2017-04-05 MED ORDER — POTASSIUM CHLORIDE 20 MEQ PO PACK
PACK | ORAL | Status: AC
  Filled 2017-04-05: qty 1

## 2017-04-05 MED ORDER — POTASSIUM CHLORIDE 20 MEQ PO PACK
40.0000 meq | PACK | ORAL | Status: AC
  Administered 2017-04-05: 40 meq via ORAL
  Filled 2017-04-05: qty 2

## 2017-04-05 NOTE — ED Notes (Signed)
[place] at [phone], contacted at this time and will be here to pick up pt 25-11min. PT in wheelchair to lobby, pt verbalizes d/c and follow up. PT in NAD, VS stable

## 2017-04-05 NOTE — ED Provider Notes (Signed)
[facility] Emergency Department Provider Note  ____________________________________________   First MD Initiated Contact with Patient 04/05/17 1232     (approximate)  I have reviewed the triage vital signs and the nursing notes.   HISTORY  Chief Complaint Dizziness    HPI Allison Snyder is a 84 y.o. female who was discharged yesterday from the hospital after an admission for weakness and orthostasis and was treated for orthostatic hypotension.  She presents today by EMS for evaluation of dizziness.  She states that she is taking a new medication (which I verified on her discharge summary as midodrine) which is supposed to help with her symptoms.  She did take a dose this morning but has not yet heard her midday dose.  She says that she started to feel "swimmy headed" and dizzy similar to prior symptoms.  It scared her because of her history and she called EMS, but she says that the symptoms went away before the paramedics got there.  She has been asymptomatic since coming to the ED.  She denies headache, nausea, vomiting, chest pain, shortness of breath, abdominal pain, and dysuria.  She has been eating and drinking better since her hospitalization.  After coming to the emergency department she needed to go to the bathroom and was able to ambulate to and from the bathroom without any assistance, with no dizziness or lightheadedness, and no ataxia.  She says that she is ready to go home.  Past Medical History:  Diagnosis Date  . Asthma   . CHF (congestive heart failure) ([place])   . Gallbladder & bile duct stone with obstruction    issues off and on  . H/O tubal ligation   . History of appendectomy   . Hypoglycemic syndrome     Patient Active Problem List   Diagnosis Date Noted  . Orthostatic hypotension 04/02/2017  . Cognitive decline 02/23/2017  . Hyperglycemia 09/17/2016  . Episodic lightheadedness 09/17/2016  . Transient cerebral ischemia 05/22/2016   . Varicose veins of both lower extremities 04/07/2015  . Breast mass, left 12/11/2014  . Bilateral renal cysts 09/23/2014  . Asthma, mild persistent 07/27/2014  . Aortic atherosclerosis ([place]) 07/27/2014  . Neoplasm of uncertain behavior of skin 07/27/2014  . Impaired renal function 07/27/2014    Past Surgical History:  Procedure Laterality Date  . ABDOMINAL HYSTERECTOMY    . APPENDECTOMY    . TUBAL LIGATION      Prior to Admission medications   Medication Sig Start Date End Date Taking? Authorizing Provider  b complex vitamins capsule Take 1 capsule by mouth daily.   Yes [provider]  calcium-vitamin D 250-100 MG-UNIT tablet Take 1 tablet by mouth 2 (two) times daily.   Yes [provider]  Cholecalciferol (VITAMIN D3) 5000 units TABS Take 5,000 Units by mouth daily.    Yes [provider]  midodrine (PROAMATINE) 5 MG tablet Take 1 tablet (5 mg total) by mouth 3 (three) times daily with meals. 04/03/17 05/03/17 Yes Gladstone Lighter, MD  Multiple Minerals-Vitamins (CALCIUM CITRATE PLUS/MAGNESIUM) TABS Take 2 tablets by mouth daily. 1,000-500 mg   Yes [provider]  Omega-3 Fatty Acids (FISH OIL) 1000 MG CAPS Take 2,000 mg by mouth daily. Triple strength fish oil   Yes [provider]  Sennosides-Docusate Sodium (EX-LAX GENTLE STRENGTH PO) Take 6 capsules by mouth daily.   Yes [provider]  vitamin A 10000 UNIT capsule Take 10,000 Units by mouth daily. Reported on 04/07/2015  Yes [provider]  vitamin C (ASCORBIC ACID) 500 MG tablet Take 1,000 mg by mouth daily.    Yes [provider]  vitamin E 400 UNIT capsule Take 1,200 Units by mouth daily.   Yes [provider]  aspirin EC 81 MG tablet Take 1 tablet (81 mg total) by mouth daily. 05/21/16 05/21/17  Harvest Dark, MD    Allergies Levaquin [levofloxacin]; Prednisone; Milk-related compounds; and Albuterol  Family History  Problem  Relation Age of Onset  . Colon cancer Mother   . Tuberculosis Father   . Diabetes Sister     Social History Social History   Tobacco Use  . Smoking status: Never Smoker  . Smokeless tobacco: Never Used  Substance Use Topics  . Alcohol use: No    Alcohol/week: 0.0 oz  . Drug use: No    Review of Systems Constitutional: No fever/chills Eyes: No visual changes. ENT: No sore throat. Cardiovascular: Denies chest pain. Respiratory: Denies shortness of breath. Gastrointestinal: No abdominal pain.  No nausea, no vomiting.  No diarrhea.  No constipation. Genitourinary: Negative for dysuria. Musculoskeletal: Negative for neck pain.  Negative for back pain. Integumentary: Negative for rash. Neurological: Brief episode of dizziness which resolved prior to EMS arrival at her home.  Negative for headaches, focal weakness or numbness.   ____________________________________________   PHYSICAL EXAM:  VITAL SIGNS: ED Triage Vitals [04/05/17 1130]  Enc Vitals Group     BP (!) 159/93     Pulse Rate 80     Resp 16     Temp (!) 97.5 F (36.4 C)     Temp Source Oral     SpO2 96 %     Weight 66.2 kg (146 lb)     Height      Head Circumference      Peak Flow      Pain Score 0     Pain Loc      Pain Edu?      Excl. in [place]?     Constitutional: Alert and oriented.  No acute distress. Eyes: Conjunctivae are normal.  Head: Atraumatic. Nose: No congestion/rhinnorhea. Mouth/Throat: Mucous membranes are moist. Neck: No stridor.  No meningeal signs.   Cardiovascular: Normal rate, regular rhythm. Good peripheral circulation. Grossly normal heart sounds. Respiratory: Normal respiratory effort.  No retractions. Lungs CTAB. Gastrointestinal: Soft and nontender. No distention.  Musculoskeletal: No lower extremity tenderness nor edema. No gross deformities of extremities. Neurologic:  Normal speech and language. No gross focal neurologic deficits are appreciated good grip strength  bilaterally, normal strength in bilateral major muscle groups, no ataxia, and no  Occurrence of dizziness or lightheadedness when standing and ambulating  Skin:  Skin is warm, dry and intact. No rash noted. Psychiatric: Mood and affect are normal. Speech and behavior are normal.  ____________________________________________   LABS (all labs ordered are listed, but only abnormal results are displayed)  Labs Reviewed  BASIC METABOLIC PANEL - Abnormal; Notable for the following components:      Result Value   Potassium 3.4 (*)    Chloride 100 (*)    Glucose, Bld 113 (*)    Calcium 8.7 (*)    All other components within normal limits  URINALYSIS, COMPLETE (UACMP) WITH MICROSCOPIC - Abnormal; Notable for the following components:   Color, Urine YELLOW (*)    APPearance HAZY (*)    Squamous Epithelial / LPF 0-5 (*)    All other components within normal limits  GLUCOSE, CAPILLARY -  Abnormal; Notable for the following components:   Glucose-Capillary 106 (*)    All other components within normal limits  CBC  CBG MONITORING, ED   ____________________________________________  EKG  ED ECG REPORT I, Hinda Kehr, the attending physician, personally viewed and interpreted this ECG.  Date: 04/05/2017 EKG Time: 11:37 AM Rate: 78 Rhythm: normal sinus rhythm QRS Axis: normal Intervals: RBBB ST/T Wave abnormalities: Non-specific ST segment / T-wave changes, but no evidence of acute ischemia. Narrative Interpretation: no evidence of acute ischemia ____________________________________________  RADIOLOGY   ED MD interpretation: No results  Official radiology report(s): No results found.  ____________________________________________   PROCEDURES  Critical Care performed: No   Procedure(s) performed:   Procedures   ____________________________________________   INITIAL IMPRESSION / ASSESSMENT AND PLAN / ED COURSE  As part of my medical decision making, I reviewed the  following data within the [place] notes reviewed and incorporated, Labs reviewed , EKG interpreted , Old chart reviewed and Notes from prior ED visits    Differential diagnosis includes, but is not limited to, orthostatic hypotension, acute infection, CVA.  She was just discharged from the hospital and seems to be doing better on the midodrine.  It sounds as if she became nervous after feeling a recurrence of some minor lightheadedness and dizziness but states definitively that the symptoms got better before EMS arrived and they have been gone ever since.  She has been in the emergency department over 2 hours with no recurrence of symptoms and no focal neurological deficits.  I think that she is most likely still recovering from her hospitalization and her new medication regimen but there is no indication of any acute or emergent medical condition at this time and no indication for any imaging.  Her lab results are reassuring with a very slightly decreased potassium, no evidence of UTI, and reassuring CBC.  She states that she is ready to go home is appropriate.  I gave my usual and customary return precautions.      ____________________________________________  FINAL CLINICAL IMPRESSION(S) / ED DIAGNOSES  Final diagnoses:  Dizziness  Hypokalemia     MEDICATIONS GIVEN DURING THIS VISIT:  Medications  potassium chloride (KLOR-CON) packet 40 mEq (not administered)     ED Discharge Orders    None       Note:  This document was prepared using Dragon voice recognition software and may include unintentional dictation errors.    Hinda Kehr, MD 04/05/17 1313

## 2017-04-05 NOTE — ED Notes (Signed)
This RN has tried to contact pt granddaughter x2 with no answer , pt trying to get in touch with ride at this time with no success./

## 2017-04-05 NOTE — ED Notes (Signed)
PT in wc in lobby, first RN and Animal nutritionist aware pt is waiting on ride

## 2017-04-05 NOTE — ED Triage Notes (Signed)
Pt to ED via EMS from home with c/o dizziness. Pt poor historian, unaware LKW. Pt was seen here recently for same symptoms. Pt A&Ox4, VS stable . Speech clear, stroke screen neg.

## 2017-04-05 NOTE — Discharge Instructions (Signed)
Your workup in the Emergency Department today was reassuring.  We did not find any specific abnormalities.  We recommend you drink plenty of fluids, take your regular medications and/or any new ones prescribed today, and follow up with the doctor(s) listed in these documents as recommended.  Return to the Emergency Department if you develop new or worsening symptoms that concern you.  

## 2017-04-05 NOTE — ED Notes (Signed)
Pt ambulatory with no assistance needed to bedside commode

## 2017-04-07 ENCOUNTER — Telehealth: Payer: Self-pay

## 2017-04-07 NOTE — Telephone Encounter (Signed)
Transition Care Management Follow-Up Telephone Call   Date discharged and where: 04/06/17 from [facility]  How have you been since you were released from the hospital? States her dizziness and weakness have improved since her return home. Denies having an appetite. Strongly encouraged need to remain hydrated. Recommended to drink at least 6-8 8oz glasses of water per day. Also encouraged pt to supplement nutritional intake with Ensure until her appetite returns. Verbalized acceptance and understanding.   To better monitor orthostasis, encouraged pt to monitor b/p while at home and to log her results. Recommended she bring her b/p log to her next OV with Dr. Army Melia. To reduce risk for fall, also advised pt to request assistance from family with ambulation or positioning when changing positions from sitting, lying or standing. Verbalized acceptance and understanding.  Any patient concerns? Denies any concerns at this time.  Items Reviewed:   Meds: Verified  Allergies: Verified  Dietary Changes Reviewed:  Functional Questionnaire:  Independent = I Dependent = D  ADLs: I   Dressing- I    Eating- I   Maintaining continence- I   Transferring- I   Transportation- I   Meal Prep- I   Managing Meds- I  Confirmed importance and Date/Time of follow-up visits scheduled: Confirmed f/u appt with Dr. Army Melia on 04/13/17 @ 2:30pm. Denies needing assistance with transportation arrangments.   Confirmed with patient if condition worsens to call PCP or go to the Emergency Dept. Patient was given office number and encouraged to call back with questions or concerns: Verbalized acceptance and understanding.

## 2017-04-09 ENCOUNTER — Other Ambulatory Visit: Payer: Self-pay

## 2017-04-09 ENCOUNTER — Ambulatory Visit
Admission: EM | Admit: 2017-04-09 | Discharge: 2017-04-09 | Disposition: A | Discharge status: Other Acute Inpatient Hospital | Payer: Medicare HMO | Source: Home / Self Care | Attending: Family Medicine | Admitting: Family Medicine

## 2017-04-09 ENCOUNTER — Ambulatory Visit: Payer: Medicare HMO

## 2017-04-09 ENCOUNTER — Encounter: Payer: Self-pay | Admitting: Emergency Medicine

## 2017-04-09 ENCOUNTER — Ambulatory Visit (INDEPENDENT_AMBULATORY_CARE_PROVIDER_SITE_OTHER): Payer: Medicare HMO

## 2017-04-09 ENCOUNTER — Emergency Department
Admission: EM | Admit: 2017-04-09 | Discharge: 2017-04-09 | Disposition: A | Discharge status: Home / Self Care | Payer: Medicare HMO | Attending: Emergency Medicine | Admitting: Emergency Medicine

## 2017-04-09 DIAGNOSIS — I509 Heart failure, unspecified: Secondary | ICD-10-CM | POA: Insufficient documentation

## 2017-04-09 DIAGNOSIS — J189 Pneumonia, unspecified organism: Secondary | ICD-10-CM

## 2017-04-09 DIAGNOSIS — J181 Lobar pneumonia, unspecified organism: Secondary | ICD-10-CM | POA: Insufficient documentation

## 2017-04-09 DIAGNOSIS — J4530 Mild persistent asthma, uncomplicated: Secondary | ICD-10-CM | POA: Diagnosis not present

## 2017-04-09 DIAGNOSIS — Z8673 Personal history of transient ischemic attack (TIA), and cerebral infarction without residual deficits: Secondary | ICD-10-CM | POA: Insufficient documentation

## 2017-04-09 DIAGNOSIS — R0602 Shortness of breath: Secondary | ICD-10-CM

## 2017-04-09 DIAGNOSIS — R05 Cough: Secondary | ICD-10-CM

## 2017-04-09 DIAGNOSIS — Z7982 Long term (current) use of aspirin: Secondary | ICD-10-CM | POA: Diagnosis not present

## 2017-04-09 DIAGNOSIS — R0600 Dyspnea, unspecified: Secondary | ICD-10-CM | POA: Diagnosis not present

## 2017-04-09 DIAGNOSIS — Y95 Nosocomial condition: Principal | ICD-10-CM

## 2017-04-09 DIAGNOSIS — Z79899 Other long term (current) drug therapy: Secondary | ICD-10-CM | POA: Diagnosis not present

## 2017-04-09 LAB — COMPREHENSIVE METABOLIC PANEL
ALT: 14 U/L (ref 14–54)
AST: 42 U/L — ABNORMAL HIGH (ref 15–41)
Albumin: 3.7 g/dL (ref 3.5–5.0)
Alkaline Phosphatase: 58 U/L (ref 38–126)
Anion gap: 11 (ref 5–15)
BUN: 10 mg/dL (ref 6–20)
CHLORIDE: 103 mmol/L (ref 101–111)
CO2: 27 mmol/L (ref 22–32)
CREATININE: 0.76 mg/dL (ref 0.44–1.00)
Calcium: 8.7 mg/dL — ABNORMAL LOW (ref 8.9–10.3)
Glucose, Bld: 121 mg/dL — ABNORMAL HIGH (ref 65–99)
Potassium: 4 mmol/L (ref 3.5–5.1)
SODIUM: 141 mmol/L (ref 135–145)
Total Bilirubin: 0.9 mg/dL (ref 0.3–1.2)
Total Protein: 7.1 g/dL (ref 6.5–8.1)

## 2017-04-09 LAB — CBC WITH DIFFERENTIAL/PLATELET
BASOS ABS: 0 10*3/uL (ref 0–0.1)
Basophils Relative: 0 %
EOS ABS: 0 10*3/uL (ref 0–0.7)
Eosinophils Relative: 0 %
HCT: 39.8 % (ref 35.0–47.0)
Hemoglobin: 13.3 g/dL (ref 12.0–16.0)
LYMPHS ABS: 1.5 10*3/uL (ref 1.0–3.6)
LYMPHS PCT: 23 %
MCH: 30.5 pg (ref 26.0–34.0)
MCHC: 33.5 g/dL (ref 32.0–36.0)
MCV: 91.2 fL (ref 80.0–100.0)
MONO ABS: 0.4 10*3/uL (ref 0.2–0.9)
Monocytes Relative: 6 %
Neutro Abs: 4.5 10*3/uL (ref 1.4–6.5)
Neutrophils Relative %: 71 %
Platelets: 209 10*3/uL (ref 150–440)
RBC: 4.36 MIL/uL (ref 3.80–5.20)
RDW: 13.3 % (ref 11.5–14.5)
WBC: 6.4 10*3/uL (ref 3.6–11.0)

## 2017-04-09 MED ORDER — IPRATROPIUM-ALBUTEROL 0.5-2.5 (3) MG/3ML IN SOLN
3.0000 mL | Freq: Once | RESPIRATORY_TRACT | Status: AC
  Administered 2017-04-09: 3 mL via RESPIRATORY_TRACT

## 2017-04-09 MED ORDER — IPRATROPIUM-ALBUTEROL 0.5-2.5 (3) MG/3ML IN SOLN
1.5000 mL | Freq: Once | RESPIRATORY_TRACT | Status: AC
  Administered 2017-04-09: 1.5 mL via RESPIRATORY_TRACT
  Filled 2017-04-09: qty 3

## 2017-04-09 MED ORDER — AMOXICILLIN-POT CLAVULANATE 875-125 MG PO TABS: 1.0000 | ORAL_TABLET | Freq: Two times a day (BID) | ORAL | 0 refills | Status: DC

## 2017-04-09 NOTE — ED Triage Notes (Signed)
Cough this am - denies being around anyone sick. Taking cough drops one after the other. Advised not to but not complying.

## 2017-04-09 NOTE — ED Notes (Signed)
Pt came from urgent care - xr taken there which they felt may be pneumonia, also gave her a breathing treatment there.

## 2017-04-09 NOTE — ED Provider Notes (Signed)
[facility] Emergency Department Provider Note   ____________________________________________    I have reviewed the triage vital signs and the nursing notes.   HISTORY  Chief Complaint Cough     HPI Allison Snyder is a 84 y.o. female who was sent in for evaluation by urgent care.  Patient has had a mild cough and x-ray performed today is possibly concerning for a lingular pneumonia.  Overall the patient reports she feels quite well.  She denies shortness of breath.  Intermittent cough.  No fevers or chills.  No pleurisy.  Has not taken anything for her cough.  Symptoms started apparently 1-2 days ago.   Past Medical History:  Diagnosis Date  . Asthma   . CHF (congestive heart failure) ([place])   . Gallbladder & bile duct stone with obstruction    issues off and on  . H/O tubal ligation   . History of appendectomy   . Hypoglycemic syndrome     Patient Active Problem List   Diagnosis Date Noted  . Orthostatic hypotension 04/02/2017  . Cognitive decline 02/23/2017  . Hyperglycemia 09/17/2016  . Episodic lightheadedness 09/17/2016  . Transient cerebral ischemia 05/22/2016  . Varicose veins of both lower extremities 04/07/2015  . Breast mass, left 12/11/2014  . Bilateral renal cysts 09/23/2014  . Asthma, mild persistent 07/27/2014  . Aortic atherosclerosis ([place]) 07/27/2014  . Neoplasm of uncertain behavior of skin 07/27/2014  . Impaired renal function 07/27/2014    Past Surgical History:  Procedure Laterality Date  . ABDOMINAL HYSTERECTOMY    . APPENDECTOMY    . TUBAL LIGATION      Prior to Admission medications   Medication Sig Start Date End Date Taking? Authorizing Provider  amoxicillin-clavulanate (AUGMENTIN) 875-125 MG tablet Take 1 tablet by mouth 2 (two) times daily for 7 days. 04/09/17 04/16/17  Lavonia Drafts, MD  aspirin EC 81 MG tablet Take 1 tablet (81 mg total) by mouth daily. 05/21/16 05/21/17  Harvest Dark, MD  b  complex vitamins capsule Take 1 capsule by mouth daily.    [provider]  calcium-vitamin D 250-100 MG-UNIT tablet Take 1 tablet by mouth 2 (two) times daily.    [provider]  Cholecalciferol (VITAMIN D3) 5000 units TABS Take 5,000 Units by mouth daily.     [provider]  midodrine (PROAMATINE) 5 MG tablet Take 1 tablet (5 mg total) by mouth 3 (three) times daily with meals. 04/03/17 05/03/17  Gladstone Lighter, MD  Multiple Minerals-Vitamins (CALCIUM CITRATE PLUS/MAGNESIUM) TABS Take 2 tablets by mouth daily. 1,000-500 mg    [provider]  Omega-3 Fatty Acids (FISH OIL) 1000 MG CAPS Take 2,000 mg by mouth daily. Triple strength fish oil    [provider]  Sennosides-Docusate Sodium (EX-LAX GENTLE STRENGTH PO) Take 6 capsules by mouth daily.    [provider]  vitamin A 10000 UNIT capsule Take 10,000 Units by mouth daily. Reported on 04/07/2015    [provider]  vitamin C (ASCORBIC ACID) 500 MG tablet Take 1,000 mg by mouth daily.     [provider]  vitamin E 400 UNIT capsule Take 1,200 Units by mouth daily.    [provider]     Allergies Levaquin [levofloxacin]; Prednisone; Milk-related compounds; and Albuterol  Family History  Problem Relation Age of Onset  . Colon cancer Mother   . Tuberculosis Father   . Diabetes Sister     Social History Social History   Tobacco  Use  . Smoking status: Never Smoker  . Smokeless tobacco: Never Used  Substance Use Topics  . Alcohol use: No    Alcohol/week: 0.0 oz  . Drug use: No    Review of Systems  Constitutional: No reports of fever Eyes: No visual changes.  ENT: No sore throat. Cardiovascular: Denies chest pain.  Pleurisy Respiratory: Denies shortness of breath.  Positive cough, nonproductive Gastrointestinal: No abdominal pain.   Genitourinary: Negative for dysuria. Musculoskeletal: Negative for back pain. Skin: Negative for  rash. Neurological: Negative for headaches    ____________________________________________   PHYSICAL EXAM:  VITAL SIGNS: ED Triage Vitals  Enc Vitals Group     BP 04/09/17 1300 121/90     Pulse Rate 04/09/17 1300 83     Resp 04/09/17 1300 18     Temp 04/09/17 1300 98.3 F (36.8 C)     Temp Source 04/09/17 1300 Oral     SpO2 04/09/17 1259 93 %     Weight 04/09/17 1300 68.5 kg (151 lb)     Height 04/09/17 1300 1.626 m (5\' 4" )     Head Circumference --      Peak Flow --      Pain Score 04/09/17 1259 0     Pain Loc --      Pain Edu? --      Excl. in [place]? --     Constitutional: Alert No acute distress.  Eyes: Conjunctivae are normal.   Nose: No congestion/rhinnorhea. Mouth/Throat: Mucous membranes are moist.    Cardiovascular: Normal rate, regular rhythm. Grossly normal heart sounds.  Good peripheral circulation. Respiratory: Normal respiratory effort.  No retractions. Lungs CTAB. Gastrointestinal: Soft and nontender. No distention.   Genitourinary: deferred Musculoskeletal:   Warm and well perfused Neurologic:  Normal speech and language. No gross focal neurologic deficits are appreciated.  Skin:  Skin is warm, dry and intact. No rash noted. Psychiatric: Mood and affect are normal. Speech and behavior are normal.  ____________________________________________   LABS (all labs ordered are listed, but only abnormal results are displayed)  Labs Reviewed  COMPREHENSIVE METABOLIC PANEL - Abnormal; Notable for the following components:      Result Value   Glucose, Bld 121 (*)    Calcium 8.7 (*)    AST 42 (*)    All other components within normal limits  CBC WITH DIFFERENTIAL/PLATELET   ____________________________________________  EKG  None ____________________________________________  RADIOLOGY  Chest x-ray, possible lingular pneumonia ____________________________________________   PROCEDURES  Procedure(s) performed: No  Procedures   Critical Care  performed: No ____________________________________________   INITIAL IMPRESSION / ASSESSMENT AND PLAN / ED COURSE  Pertinent labs & imaging results that were available during my care of the patient were reviewed by me and considered in my medical decision making (see chart for details).  Overall well-appearing in no acute distress.  Vital signs are reassuring.  Afebrile.  Lab work is normal, white count is normal.  Potentially early pneumonia on chest x-ray, however patient is in no distress.  She has good outpatient follow-up and family is able to take her back if she feels that she is worsening.  We will try p.o. antibiotics, strict return precautions discussed with patient and family    ____________________________________________   FINAL CLINICAL IMPRESSION(S) / ED DIAGNOSES  Final diagnoses:  Community acquired pneumonia of left lower lobe of lung ([place])        Note:  This document was prepared using Systems analyst and may include unintentional  dictation errors.    Lavonia Drafts, MD 04/09/17 2117

## 2017-04-09 NOTE — ED Provider Notes (Deleted)
Patient left without being seen.  [facility]    [place]oral Spikes, [place] 04/09/17 1105

## 2017-04-09 NOTE — ED Provider Notes (Signed)
[facility]    CSN: [id number] Arrival date & time: 04/09/17  1308  History   Chief Complaint Chief Complaint  Patient presents with  . Cough   HPI  84 year old female presents with cough and shortness of breath.  Patient does not give much in the way of history.  Has a history of cognitive decline.  Patient states that her cough started today.  Wet cough.  She states that she is short of breath.  Seems to be short of breath with exertion.  She has recently been hospitalized and was also seen in the ED just a few days ago. Has history of asthma.  No known inciting factor.  No known relieving factors.  No other reported symptoms.  No other complaints at this time.  Past Medical History:  Diagnosis Date  . Asthma   . CHF (congestive heart failure) ([place])   . Gallbladder & bile duct stone with obstruction    issues off and on  . H/O tubal ligation   . History of appendectomy   . Hypoglycemic syndrome     Patient Active Problem List   Diagnosis Date Noted  . Orthostatic hypotension 04/02/2017  . Cognitive decline 02/23/2017  . Hyperglycemia 09/17/2016  . Episodic lightheadedness 09/17/2016  . Transient cerebral ischemia 05/22/2016  . Varicose veins of both lower extremities 04/07/2015  . Breast mass, left 12/11/2014  . Bilateral renal cysts 09/23/2014  . Asthma, mild persistent 07/27/2014  . Aortic atherosclerosis ([place]) 07/27/2014  . Neoplasm of uncertain behavior of skin 07/27/2014  . Impaired renal function 07/27/2014    Past Surgical History:  Procedure Laterality Date  . ABDOMINAL HYSTERECTOMY    . APPENDECTOMY    . TUBAL LIGATION      OB History    No data available       Home Medications    Prior to Admission medications   Medication Sig Start Date End Date Taking? Authorizing Provider  aspirin EC 81 MG tablet Take 1 tablet (81 mg total) by mouth daily. 05/21/16 05/21/17  Harvest Dark, MD  b complex vitamins capsule Take 1 capsule by  mouth daily.    [provider]  calcium-vitamin D 250-100 MG-UNIT tablet Take 1 tablet by mouth 2 (two) times daily.    [provider]  Cholecalciferol (VITAMIN D3) 5000 units TABS Take 5,000 Units by mouth daily.     [provider]  midodrine (PROAMATINE) 5 MG tablet Take 1 tablet (5 mg total) by mouth 3 (three) times daily with meals. 04/03/17 05/03/17  Gladstone Lighter, MD  Multiple Minerals-Vitamins (CALCIUM CITRATE PLUS/MAGNESIUM) TABS Take 2 tablets by mouth daily. 1,000-500 mg    [provider]  Omega-3 Fatty Acids (FISH OIL) 1000 MG CAPS Take 2,000 mg by mouth daily. Triple strength fish oil    [provider]  Sennosides-Docusate Sodium (EX-LAX GENTLE STRENGTH PO) Take 6 capsules by mouth daily.    [provider]  vitamin A 10000 UNIT capsule Take 10,000 Units by mouth daily. Reported on 04/07/2015    [provider]  vitamin C (ASCORBIC ACID) 500 MG tablet Take 1,000 mg by mouth daily.     [provider]  vitamin E 400 UNIT capsule Take 1,200 Units by mouth daily.    [provider]    Family History Family History  Problem Relation Age of Onset  . Colon cancer Mother   . Tuberculosis Father   . Diabetes Sister     Social History  Social History   Tobacco Use  . Smoking status: Never Smoker  . Smokeless tobacco: Never Used  Substance Use Topics  . Alcohol use: No    Alcohol/week: 0.0 oz  . Drug use: No     Allergies   Levaquin [levofloxacin]; Prednisone; Milk-related compounds; and Albuterol   Review of Systems Review of Systems  Constitutional: Negative for fever.  Respiratory: Positive for cough and shortness of breath.    Physical Exam Triage Vital Signs ED Triage Vitals  Enc Vitals Group     BP 04/09/17 1001 (!) 183/62     Pulse Rate 04/09/17 1001 72     Resp 04/09/17 1001 18     Temp 04/09/17 1001 98.7 F (37.1 C)     Temp Source 04/09/17 1001 Oral     SpO2 04/09/17  1001 94 %     Weight 04/09/17 0959 151 lb (68.5 kg)     Height 04/09/17 0959 5\' 4"  (1.626 m)     Head Circumference --      Peak Flow --      Pain Score --      Pain Loc --      Pain Edu? --      Excl. in [place]? --    Updated Vital Signs BP (!) 181/71   Pulse 74   Temp 98.7 F (37.1 C) (Oral)   Resp 18   Ht 5\' 4"  (1.626 m)   Wt 151 lb (68.5 kg)   SpO2 94%   BMI 25.92 kg/m   Physical Exam  Constitutional:  Frail elderly female.  Appears dyspneic.  Speaking in short sentences.  HENT:  Head: Normocephalic and atraumatic.  Eyes: Conjunctivae are normal. Right eye exhibits no discharge. Left eye exhibits no discharge.  Cardiovascular: Normal rate and regular rhythm.  Pulmonary/Chest:  Work of breathing.  Speaking in short sentences. Diffuse coarse breath sounds.  Neurological: She is alert.  Psychiatric: Her behavior is normal.  Flat affect.  Nursing note and vitals reviewed.  UC Treatments / Results  Labs (all labs ordered are listed, but only abnormal results are displayed) Labs Reviewed - No data to display  EKG  EKG Interpretation None       Radiology Dg Chest 2 View  Result Date: 04/09/2017 CLINICAL DATA:  Dry cough.  Shortness of breath. EXAM: CHEST  2 VIEW COMPARISON:  04/02/2017 FINDINGS: Cardiomediastinal silhouette is normal. Mediastinal contours appear intact. Tortuosity and calcific atherosclerotic disease of the aorta. Elevation of the left hemidiaphragm. There is no evidence of pleural effusion or pneumothorax. Lingular airspace consolidation versus atelectasis. Osseous structures are without acute abnormality. Soft tissues are grossly normal. IMPRESSION: Lingular airspace consolidation versus atelectasis. Electronically Signed   By: Fidela Salisbury M.D.   On: 04/09/2017 11:49    Procedures Procedures (including critical care time)  Medications Ordered in UC Medications  ipratropium-albuterol (DUONEB) 0.5-2.5 (3) MG/3ML nebulizer solution 3 mL (3  mLs Nebulization Given 04/09/17 1123)     Initial Impression / Assessment and Plan / UC Course  I have reviewed the triage vital signs and the nursing notes.  Pertinent labs & imaging results that were available during my care of the patient were reviewed by me and considered in my medical decision making (see chart for details).     84 year old female presents with cough and shortness of breath.  Does not appear well.  Had some improvement with breathing treatment.  X-ray revealed likely lingular consolidation.  Patient has recently been in the  hospital and therefore I believe she has a hospital-acquired pneumonia.  Patient is going to the hospital via ambulance.  Final Clinical Impressions(s) / UC Diagnoses   Final diagnoses:  HAP (hospital-acquired pneumonia)    ED Discharge Orders    None     Controlled Substance Prescriptions [place] Controlled Substance Registry consulted? Not Applicable   Coral Spikes, DO 04/09/17 1206

## 2017-04-09 NOTE — ED Triage Notes (Addendum)
Pt started today with moist cough and "I can't get anything out." Pt reports she was recently hospitalized and was released on Monday.

## 2017-04-13 ENCOUNTER — Ambulatory Visit (INDEPENDENT_AMBULATORY_CARE_PROVIDER_SITE_OTHER): Payer: Medicare HMO | Admitting: Internal Medicine

## 2017-04-13 ENCOUNTER — Encounter: Payer: Self-pay | Admitting: Internal Medicine

## 2017-04-13 VITALS — BP 100/60 | HR 71 | Ht 64.0 in | Wt 146.0 lb

## 2017-04-13 DIAGNOSIS — J181 Lobar pneumonia, unspecified organism: Secondary | ICD-10-CM

## 2017-04-13 DIAGNOSIS — I951 Orthostatic hypotension: Secondary | ICD-10-CM

## 2017-04-13 DIAGNOSIS — I480 Paroxysmal atrial fibrillation: Secondary | ICD-10-CM | POA: Diagnosis not present

## 2017-04-13 DIAGNOSIS — R739 Hyperglycemia, unspecified: Secondary | ICD-10-CM

## 2017-04-13 DIAGNOSIS — K5901 Slow transit constipation: Secondary | ICD-10-CM | POA: Diagnosis not present

## 2017-04-13 DIAGNOSIS — R69 Illness, unspecified: Secondary | ICD-10-CM | POA: Diagnosis not present

## 2017-04-13 DIAGNOSIS — F552 Abuse of laxatives: Secondary | ICD-10-CM | POA: Diagnosis not present

## 2017-04-13 DIAGNOSIS — J189 Pneumonia, unspecified organism: Secondary | ICD-10-CM

## 2017-04-13 NOTE — Progress Notes (Signed)
Date:  04/13/2017   Name:  Allison Snyder   DOB:  10/01/1929   MRN:  [id number]   Chief Complaint: Hospitalization Follow-up (Started on Midodrine 3x daily. They stopped her antibiotics but she is still taking them. Unsure why stopped- for UTI, sore throat.)  Admitted to [facility] 04/02/17 to 04/04/17 with weakness and low BP pressure.  She has Afib and shortness of breath. She went back to ED on 04/05/17 and got IVF and felt better.  She went to UC on 04/09/17 and was diagnosed with pneumonia and started on Augmentin. She received a call from the nurse 04/04/17 and again on 04/07/17.  Hypotension with dizziness - started on Midodrine three times a day. She does not think she is much better. She is not drinking much per granddaughter.  She only like water and does not use any salt or eat salty foods. She has not been referred to Cardiology despite episodes of Afib and hypotension.  Pneumonia - seen in UC with Lingular pneumonia and started on Augmentin.  She is still coughing and feels weak but tolerating the antibiotics. She is trying to drink more fluids.  Constipation - need to cut back gradually on Exlax - currently on 7 per day.  May use suppositories as needed.  She can not have a BM without taking laxatives.  She has been doing this daily for at least 2 years.  Review of Systems  Constitutional: Negative for chills, fatigue, fever and unexpected weight change.  Respiratory: Positive for cough. Negative for chest tightness, shortness of breath and wheezing.   Cardiovascular: Negative for chest pain and palpitations.  Genitourinary: Negative for dysuria.  Neurological: Positive for dizziness and light-headedness. Negative for headaches.  Psychiatric/Behavioral: Positive for confusion. Negative for agitation and sleep disturbance.    Patient Active Problem List   Diagnosis Date Noted  . Orthostatic hypotension 04/02/2017  . Cognitive decline 02/23/2017  . Hyperglycemia 09/17/2016  .  Episodic lightheadedness 09/17/2016  . Transient cerebral ischemia 05/22/2016  . Varicose veins of both lower extremities 04/07/2015  . Breast mass, left 12/11/2014  . Bilateral renal cysts 09/23/2014  . Asthma, mild persistent 07/27/2014  . Aortic atherosclerosis ([place]) 07/27/2014  . Neoplasm of uncertain behavior of skin 07/27/2014  . Impaired renal function 07/27/2014    Prior to Admission medications   Medication Sig Start Date End Date Taking? Authorizing Provider  aspirin EC 81 MG tablet Take 1 tablet (81 mg total) by mouth daily. 05/21/16 05/21/17 Yes PaduchowskiLennette Bihari, MD  b complex vitamins capsule Take 1 capsule by mouth daily.   Yes [provider]  calcium-vitamin D 250-100 MG-UNIT tablet Take 1 tablet by mouth 2 (two) times daily.   Yes [provider]  Cholecalciferol (VITAMIN D3) 5000 units TABS Take 5,000 Units by mouth daily.    Yes [provider]  midodrine (PROAMATINE) 5 MG tablet Take 1 tablet (5 mg total) by mouth 3 (three) times daily with meals. 04/03/17 05/03/17 Yes Gladstone Lighter, MD  Multiple Minerals-Vitamins (CALCIUM CITRATE PLUS/MAGNESIUM) TABS Take 2 tablets by mouth daily. 1,000-500 mg   Yes [provider]  Omega-3 Fatty Acids (FISH OIL) 1000 MG CAPS Take 2,000 mg by mouth daily. Triple strength fish oil   Yes [provider]  Sennosides-Docusate Sodium (EX-LAX GENTLE STRENGTH PO) Take 6 capsules by mouth daily.   Yes [provider]  vitamin A 10000 UNIT capsule Take 10,000 Units by mouth daily. Reported on 04/07/2015   Yes [provider]  vitamin C (ASCORBIC ACID) 500 MG tablet Take 1,000 mg by mouth daily.    Yes [provider]  vitamin E 400 UNIT capsule Take 1,200 Units by mouth daily.   Yes [provider]    Allergies  Allergen Reactions  . Levaquin [Levofloxacin] Shortness Of Breath  . Prednisone Shortness Of Breath  . Milk-Related Compounds Other (See Comments)     Patient states it causes bronchitis   . Albuterol Anxiety    Past Surgical History:  Procedure Laterality Date  . ABDOMINAL HYSTERECTOMY    . APPENDECTOMY    . TUBAL LIGATION      Social History   Tobacco Use  . Smoking status: Never Smoker  . Smokeless tobacco: Never Used  Substance Use Topics  . Alcohol use: No    Alcohol/week: 0.0 oz  . Drug use: No     Medication list has been reviewed and updated.  PHQ 2/9 Scores 02/23/2017 12/26/2015 04/28/2015 04/07/2015  PHQ - 2 Score 0 0 1 1    Physical Exam  Constitutional: She is oriented to person, place, and time. She appears well-developed. No distress.  HENT:  Head: Normocephalic and atraumatic.  Neck: Normal range of motion. Neck supple.  Cardiovascular: Normal rate, regular rhythm and normal heart sounds.  Pulmonary/Chest: Effort normal and breath sounds normal. No respiratory distress. She has no wheezes. She has no rales.  Musculoskeletal: Normal range of motion. She exhibits no edema.  Neurological: She is alert and oriented to person, place, and time.  Skin: Skin is warm and dry. No rash noted.  Psychiatric: She has a normal mood and affect. Her behavior is normal. Thought content normal. She exhibits abnormal recent memory.  Nursing note and vitals reviewed.   BP 118/72   Pulse 71   Ht 5\' 4"  (1.626 m)   Wt 146 lb (66.2 kg)   SpO2 95%   BMI 25.06 kg/m  Standing BP 100/60  Assessment and Plan: 1. Orthostatic hypotension Continue mididrine but not much benefit noted - Ambulatory referral to Cardiology  2. Community acquired pneumonia of right middle lobe of lung ([place]) Finish antibiotics Push fluids Expect to be weaker for the next several weeks  3. Paroxysmal atrial fibrillation (H[facility]) Seen on admission - elected on aspirin anticoag due to age - Ambulatory referral to Cardiology  4. Elevated blood sugar Pt and granddaughter very concerned about possible DM - Hemoglobin A1c  5. Constipation by  delayed colonic transit Pt is likely causing dehydration with chronic laxative abuse Encourage slow taper off is possible.   No orders of the defined types were placed in this encounter.   Partially dictated using Editor, commissioning. Any errors are unintentional.  Halina Maidens, MD [facility]  04/13/2017

## 2017-04-13 NOTE — Patient Instructions (Addendum)
Finish Augmentin given for pneumonia.  Take Midodrine three times a day.  Drink at least 65 oz of fluids per day.  Try to add some salt into diet.  Referring to Cardiology.  Continue one baby aspirin daily.  Gradually cut back on the number of Exlax per day.  Decrease every month until off of medications.

## 2017-04-14 LAB — HEMOGLOBIN A1C
Est. average glucose Bld gHb Est-mCnc: 111 mg/dL
Hgb A1c MFr Bld: 5.5 % (ref 4.8–5.6)

## 2017-04-22 DIAGNOSIS — I700 Atherosclerosis of aorta: Secondary | ICD-10-CM | POA: Diagnosis not present

## 2017-04-22 DIAGNOSIS — R42 Dizziness and giddiness: Secondary | ICD-10-CM | POA: Diagnosis not present

## 2017-05-04 IMAGING — CR DG CHEST 2V
2 series · 2 of 2 positions shown · non-contrast
Comparison: None.

CLINICAL DATA: Dyspnea for the past 2 hours with leads entering
cough

EXAM:
CHEST  2 VIEW

[chest pa]
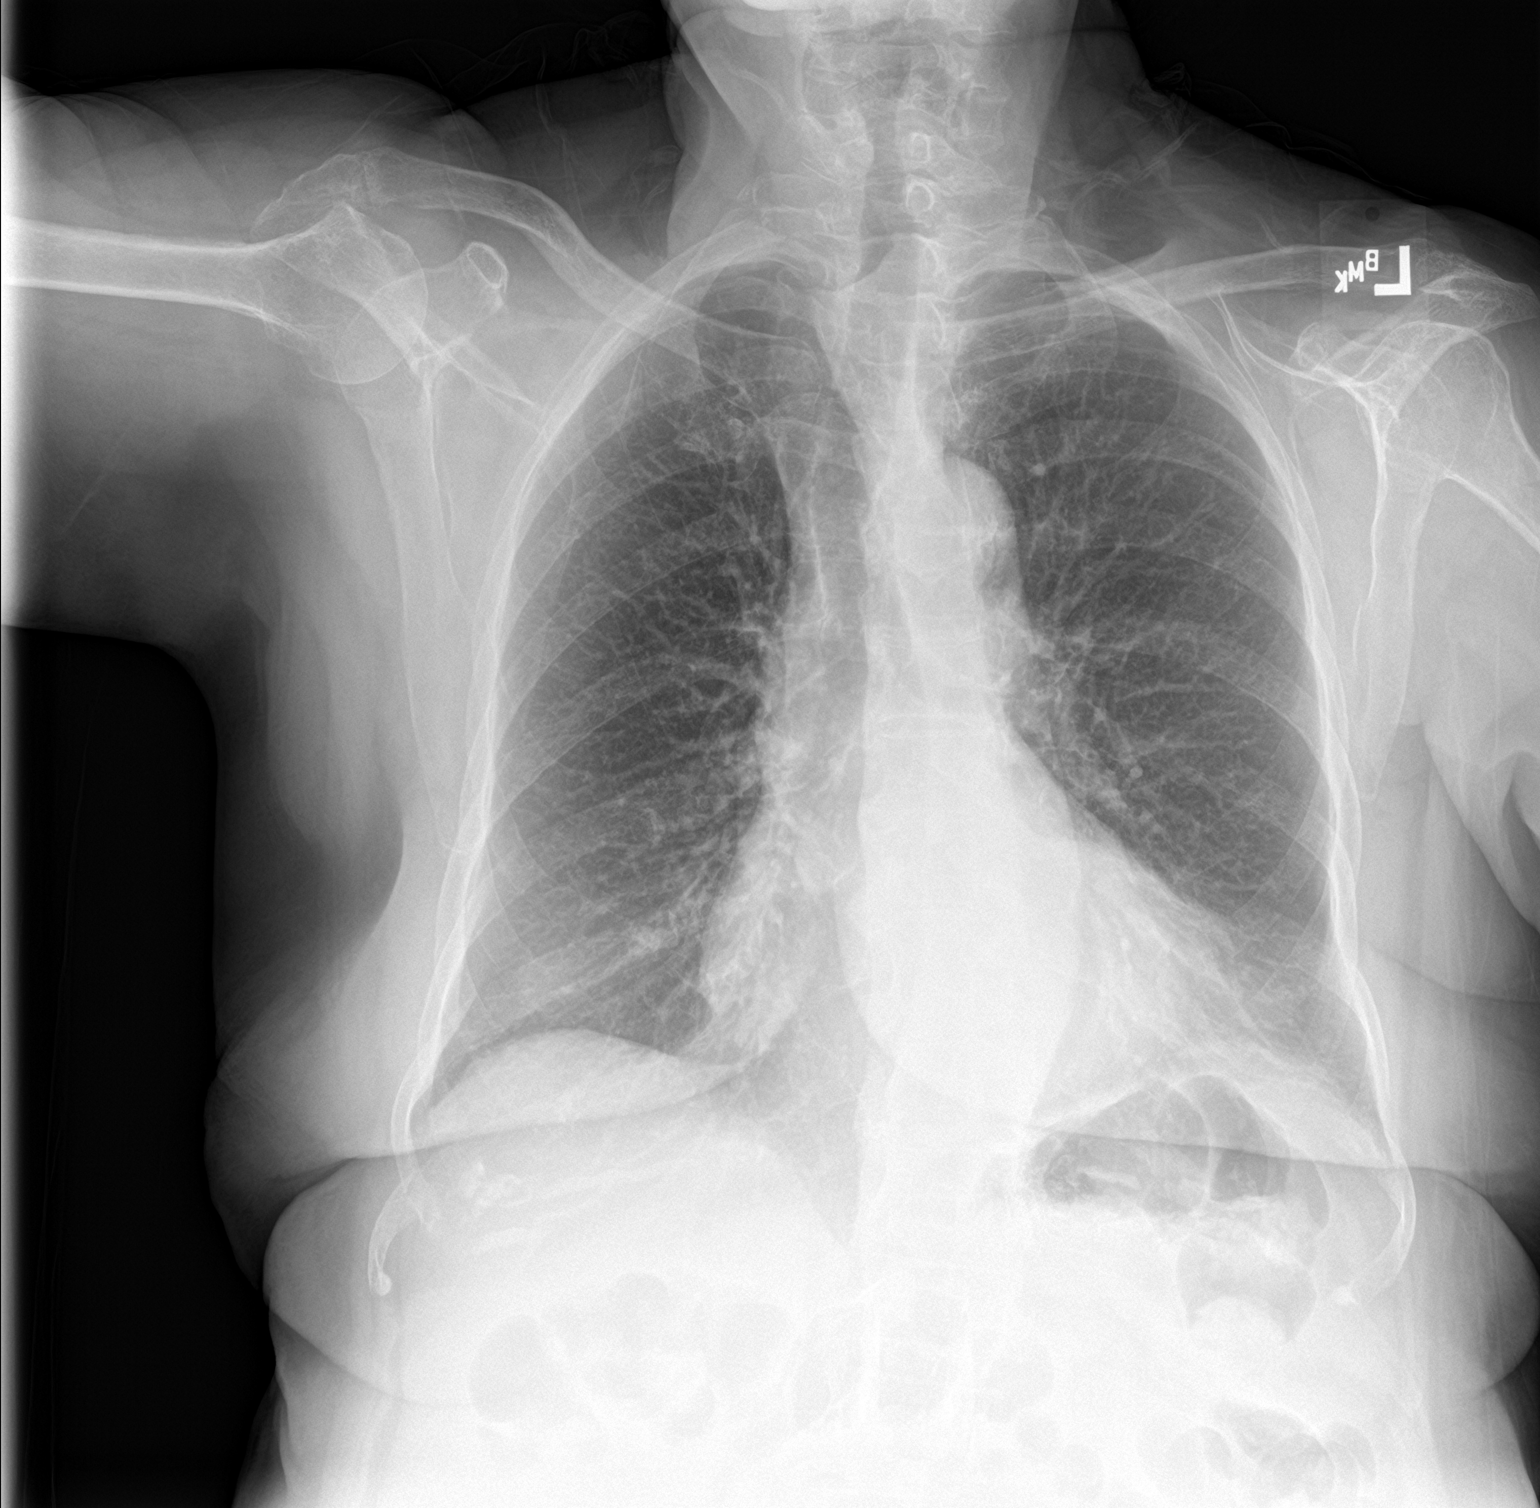

[chest lat]
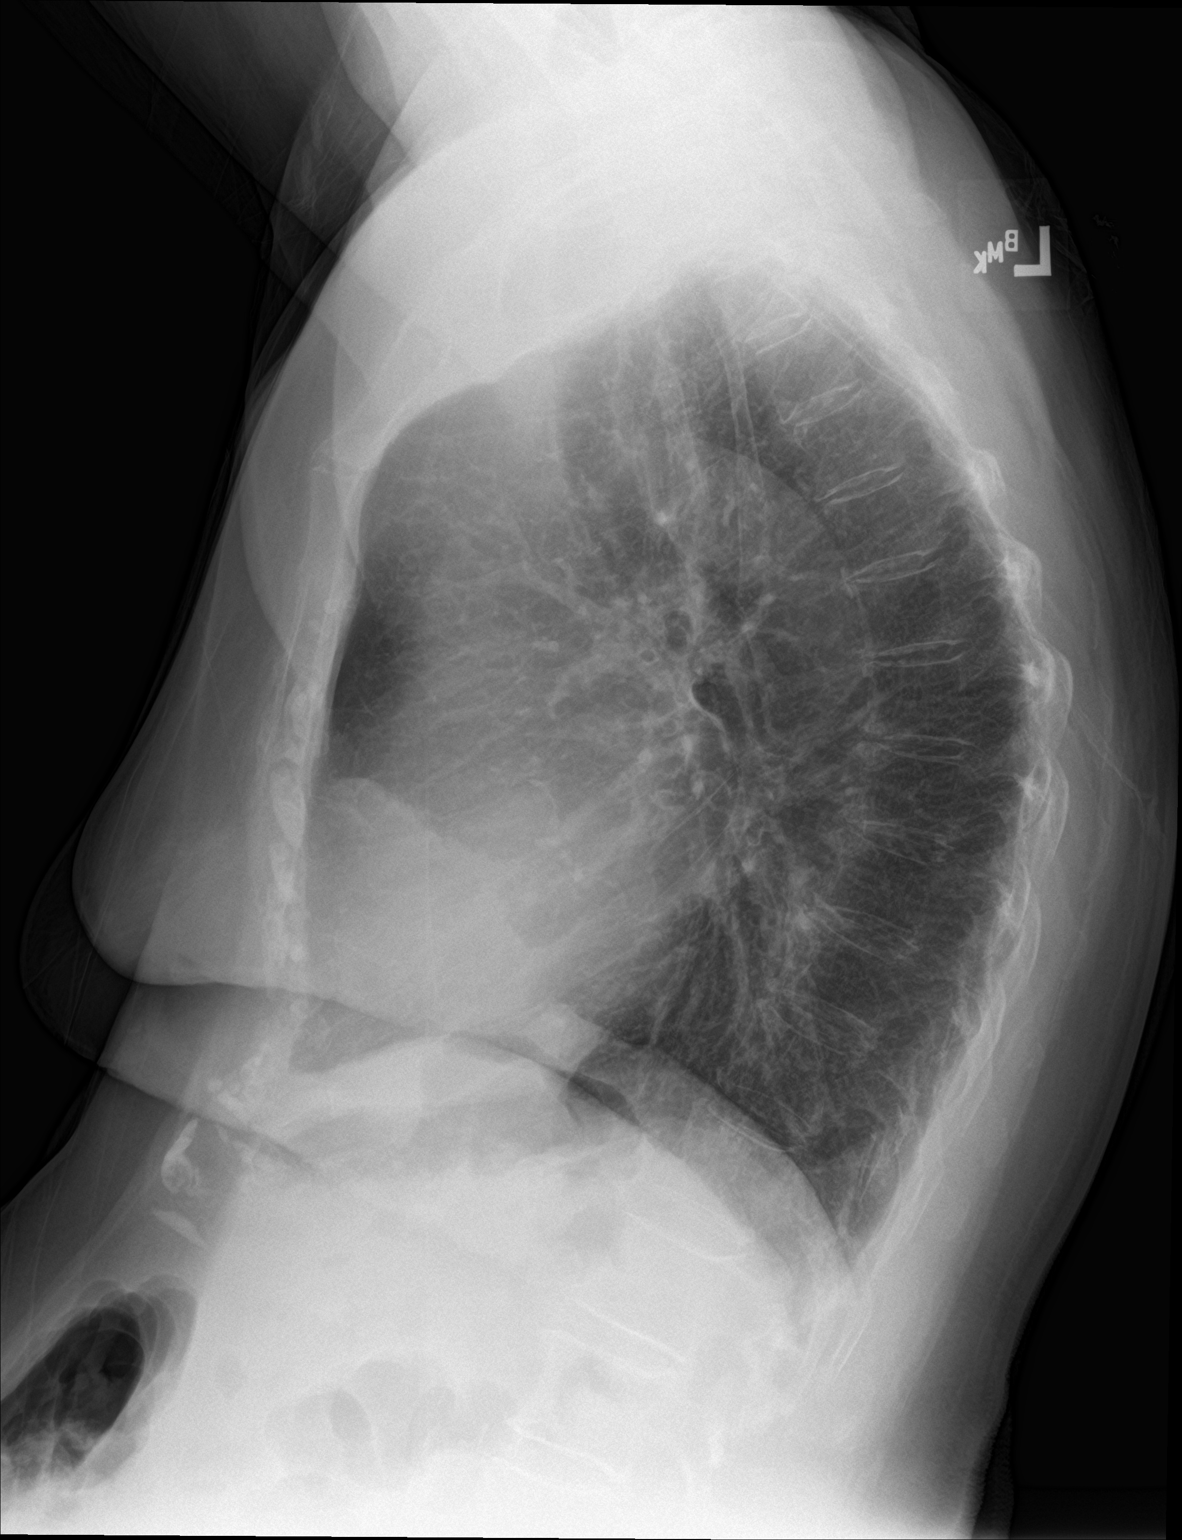

[2 of 2 positions shown; findings below may reference images not displayed]

FINDINGS: Heart is top-normal in size. There is aortic atherosclerosis without
aneurysm. Bibasilar atelectasis and/or scarring is noted. Chronic
bronchitic change of the lungs without overt pulmonary edema,
effusion or pneumothorax. No pneumonic consolidations. Degenerative
changes are noted about the shoulders and dorsal spine.
IMPRESSION: Chronic mild bronchitic change of the lungs. Aortic atherosclerosis.

## 2017-06-23 ENCOUNTER — Emergency Department: Payer: Medicare HMO

## 2017-06-23 ENCOUNTER — Emergency Department
Admission: EM | Admit: 2017-06-23 | Discharge: 2017-06-23 | Discharge status: Against Medical Advice | Payer: Medicare HMO | Attending: Emergency Medicine | Admitting: Emergency Medicine

## 2017-06-23 DIAGNOSIS — I509 Heart failure, unspecified: Secondary | ICD-10-CM | POA: Diagnosis not present

## 2017-06-23 DIAGNOSIS — J45909 Unspecified asthma, uncomplicated: Secondary | ICD-10-CM | POA: Diagnosis not present

## 2017-06-23 DIAGNOSIS — R0602 Shortness of breath: Secondary | ICD-10-CM | POA: Insufficient documentation

## 2017-06-23 DIAGNOSIS — Z79899 Other long term (current) drug therapy: Secondary | ICD-10-CM | POA: Diagnosis not present

## 2017-06-23 LAB — BASIC METABOLIC PANEL
Anion gap: 7 (ref 5–15)
BUN: 15 mg/dL (ref 6–20)
CO2: 31 mmol/L (ref 22–32)
CREATININE: 0.89 mg/dL (ref 0.44–1.00)
Calcium: 9.2 mg/dL (ref 8.9–10.3)
Chloride: 101 mmol/L (ref 101–111)
GFR calc non Af Amer: 57 mL/min — ABNORMAL LOW (ref >60)
Glucose, Bld: 115 mg/dL — ABNORMAL HIGH (ref 65–99)
POTASSIUM: 3.8 mmol/L (ref 3.5–5.1)
SODIUM: 139 mmol/L (ref 135–145)

## 2017-06-23 LAB — CBC
HEMATOCRIT: 44.1 % (ref 35.0–47.0)
Hemoglobin: 15 g/dL (ref 12.0–16.0)
MCH: 31.1 pg (ref 26.0–34.0)
MCHC: 34 g/dL (ref 32.0–36.0)
MCV: 91.3 fL (ref 80.0–100.0)
Platelets: 224 10*3/uL (ref 150–440)
RBC: 4.83 MIL/uL (ref 3.80–5.20)
RDW: 13.2 % (ref 11.5–14.5)
WBC: 6.8 10*3/uL (ref 3.6–11.0)

## 2017-06-23 LAB — BRAIN NATRIURETIC PEPTIDE: B Natriuretic Peptide: 136 pg/mL — ABNORMAL HIGH (ref 0.0–100.0)

## 2017-06-23 LAB — TROPONIN I: Troponin I: <0.03 ng/mL (ref <0.03)

## 2017-06-23 MED ORDER — IPRATROPIUM-ALBUTEROL 0.5-2.5 (3) MG/3ML IN SOLN
3.0000 mL | Freq: Once | RESPIRATORY_TRACT | Status: AC
  Administered 2017-06-23: 3 mL via RESPIRATORY_TRACT

## 2017-06-23 MED ORDER — IPRATROPIUM-ALBUTEROL 0.5-2.5 (3) MG/3ML IN SOLN
RESPIRATORY_TRACT | Status: AC
  Filled 2017-06-23: qty 3

## 2017-06-23 NOTE — ED Provider Notes (Addendum)
[facility] Emergency Department Provider Note  ____________________________________________   I have reviewed the triage vital signs and the nursing notes. Where available I have reviewed prior notes and, if possible and indicated, outside hospital notes.    HISTORY  Chief Complaint Shortness of Breath    HPI Allison Snyder is a 84 y.o. female mild asthma, presents today complaining of "the pollen makes me cough sometimes".  We are having high pollen counts and this area at this times.  Patient does take early inhaled steroids but she states she cannot have oral steroids.  She states that it is a contraindication for her.  Patient has been seen here many times for similar complaints.  Usually she ends up leaving AMA.  Today she states that she stepped outside onto her porch and when she breathes in the polyp felt like she was going to start wheezing.  She does not feel that she is wheezing at this time and she has no complaints at this moment.  She denies any chest pain, leg swelling, exertional symptoms.  This is very similar to multiple prior episodes she states.  She does not take any anti-histamines.  Not short of breath at this time.  Denies orthopnea.  No leg swelling     Past Medical History:  Diagnosis Date  . Asthma   . CHF (congestive heart failure) ([place])   . Gallbladder & bile duct stone with obstruction    issues off and on  . H/O tubal ligation   . History of appendectomy   . Hypoglycemic syndrome     Patient Active Problem List   Diagnosis Date Noted  . Laxative abuse 04/13/2017  . Orthostatic hypotension 04/02/2017  . Cognitive decline 02/23/2017  . Hyperglycemia 09/17/2016  . Episodic lightheadedness 09/17/2016  . Transient cerebral ischemia 05/22/2016  . Varicose veins of both lower extremities 04/07/2015  . Breast mass, left 12/11/2014  . Bilateral renal cysts 09/23/2014  . Asthma, mild persistent 07/27/2014  . Aortic  atherosclerosis ([place]) 07/27/2014  . Neoplasm of uncertain behavior of skin 07/27/2014  . Impaired renal function 07/27/2014    Past Surgical History:  Procedure Laterality Date  . ABDOMINAL HYSTERECTOMY    . APPENDECTOMY    . TUBAL LIGATION      Prior to Admission medications   Medication Sig Start Date End Date Taking? Authorizing Provider  b complex vitamins capsule Take 1 capsule by mouth daily.    [provider]  calcium-vitamin D 250-100 MG-UNIT tablet Take 1 tablet by mouth 2 (two) times daily.    [provider]  Cholecalciferol (VITAMIN D3) 5000 units TABS Take 5,000 Units by mouth daily.     [provider]  Multiple Minerals-Vitamins (CALCIUM CITRATE PLUS/MAGNESIUM) TABS Take 2 tablets by mouth daily. 1,000-500 mg    [provider]  Omega-3 Fatty Acids (FISH OIL) 1000 MG CAPS Take 2,000 mg by mouth daily. Triple strength fish oil    [provider]  Sennosides-Docusate Sodium (EX-LAX GENTLE STRENGTH PO) Take 6 capsules by mouth daily.    [provider]  vitamin A 10000 UNIT capsule Take 10,000 Units by mouth daily. Reported on 04/07/2015    [provider]  vitamin C (ASCORBIC ACID) 500 MG tablet Take 1,000 mg by mouth daily.     [provider]  vitamin E 400 UNIT capsule Take 1,200 Units by mouth daily.    [provider]    Allergies Levaquin [levofloxacin]; Prednisone; Milk-related compounds;  and Albuterol  Family History  Problem Relation Age of Onset  . Colon cancer Mother   . Tuberculosis Father   . Diabetes Sister     Social History Social History   Tobacco Use  . Smoking status: Never Snyder  . Smokeless tobacco: Never Used  Substance Use Topics  . Alcohol use: No    Alcohol/week: 0.0 oz  . Drug use: No    Review of Systems Constitutional: No fever/chills Eyes: No visual changes. ENT: No sore throat. No stiff neck no neck pain Cardiovascular: Denies chest  pain. Respiratory: + shortness of breath. Gastrointestinal:   no vomiting.  No diarrhea.  No constipation. Genitourinary: Negative for dysuria. Musculoskeletal: Negative lower extremity swelling Skin: Negative for rash. Neurological: Negative for severe headaches, focal weakness or numbness.   ____________________________________________   PHYSICAL EXAM:  VITAL SIGNS: ED Triage Vitals  Enc Vitals Group     BP 06/23/17 2003 (!) 143/57     Pulse Rate 06/23/17 2003 74     Resp 06/23/17 2003 19     Temp 06/23/17 2003 98.4 F (36.9 C)     Temp Source 06/23/17 2003 Oral     SpO2 06/23/17 2003 95 %     Weight 06/23/17 2000 150 lb (68 kg)     Height --      Head Circumference --      Peak Flow --      Pain Score 06/23/17 2000 0     Pain Loc --      Pain Edu? --      Excl. in [place]? --     Constitutional: Alert and oriented. Well appearing and in no acute distress. Eyes: Conjunctivae are normal Head: Atraumatic HEENT: No congestion/rhinnorhea. Mucous membranes are moist.  Oropharynx non-erythematous Neck:   Nontender with no meningismus, no masses, no stridor Cardiovascular: Normal rate, regular rhythm. Grossly normal heart sounds.  Good peripheral circulation. Respiratory: Normal respiratory effort.  No retractions. Lungs CTAB. Abdominal: Soft and nontender. No distention. No guarding no rebound Back:  There is no focal tenderness or step off.  there is no midline tenderness there are no lesions noted. there is no CVA tenderness Musculoskeletal: No lower extremity tenderness, no upper extremity tenderness. No joint effusions, no DVT signs strong distal pulses no edema Neurologic:  Normal speech and language. No gross focal neurologic deficits are appreciated.  Skin:  Skin is warm, dry and intact. No rash noted. Psychiatric: Mood and affect are normal. Speech and behavior are normal.  ____________________________________________   LABS (all labs ordered are listed, but only  abnormal results are displayed)  Labs Reviewed  CBC  BASIC METABOLIC PANEL  TROPONIN I  BRAIN NATRIURETIC PEPTIDE    Pertinent labs  results that were available during my care of the patient were reviewed by me and considered in my medical decision making (see chart for details). ____________________________________________  EKG  I personally interpreted any EKGs ordered by me or triage Sinus rhythm, rate 70 bpm, right bundle branch block noted, no acute ischemia.  No acute change from prior ____________________________________________  RADIOLOGY  Pertinent labs & imaging results that were available during my care of the patient were reviewed by me and considered in my medical decision making (see chart for details). If possible, patient and/or family made aware of any abnormal findings.  Dg Chest 2 View  Result Date: 06/23/2017 CLINICAL DATA:  Acute shortness of breath today. EXAM: CHEST - 2 VIEW COMPARISON:  04/09/2017 FINDINGS: Cardiomegaly noted.  Mild bibasilar atelectasis noted. There is no evidence of focal airspace disease, pulmonary edema, suspicious pulmonary nodule/mass, pleural effusion, or pneumothorax. No acute bony abnormalities are identified. IMPRESSION: Cardiomegaly with mild bibasilar atelectasis. Electronically Signed   By: Margarette Canada M.D.   On: 06/23/2017 20:51   ____________________________________________    PROCEDURES  Procedure(s) performed: None  Procedures  Critical Care performed: None  ____________________________________________   INITIAL IMPRESSION / ASSESSMENT AND PLAN / ED COURSE  Pertinent labs & imaging results that were available during my care of the patient were reviewed by me and considered in my medical decision making (see chart for details).  Ambulating throughout the department in no acute distress, she is here because she feels that the pollen is bad for her breathing, but she has no evidence of pneumonia, her lungs are  reassuring, and there is no evidence of ACS PE dissection myocarditis endocarditis pericarditis pneumonia pneumothorax bronchitis or other acute pathology.  We did give her breathing treatment which she did consent to as she is not actually allergic to albuterol and she states she feels "fine" and wants to go home.  I do not see any acute pathology present on extensive exam, vital signs are reassuring, sats are in the mid to high 90s, states she has had symptoms such as they are all day, but her troponin is negative, BNP is not markedly elevated over baseline, chest x-ray is reassuring elect lites are reassuring white count is normal etc.  We will consider starting her on Claritin and have her follow closely with PCP  ----------------------------------------- 9:52 PM on 06/23/2017 -----------------------------------------  Went back to check patient and found that she had left.  Patient often does this and has done it in my particular experience.  She was heard to say before she left but "probably going to give me is albuterol I could have done that at home" and then her room was empty with her gown present.   ----------------------------------------- 10:01 PM on 06/23/2017 -----------------------------------------  And apparently told registration that she was planning on leaving, this is not atypical for her, and she was seen to leave by a member of the environmental services staff with her purse over her shoulder no acute distress.  Patient has a long history of leaving AMA and in fact I was expecting her to do so.  She is not altered in any way, she understands I think what she is doing where she is going, there is no evidence that she is confused or or lost.  However, given her age I did call her son, Allison Snyder, and explained to him that she had elected to leave apparently.  I also asked him to check up on her tonight and make sure she gets home safely.  He states "sounds exactly like her" and he  is not surprised or worried about it but he will follow-up.    ____________________________________________   FINAL CLINICAL IMPRESSION(S) / ED DIAGNOSES  Final diagnoses:  None      This chart was dictated using voice recognition software.  Despite best efforts to proofread,  errors can occur which can change meaning.      Schuyler Amor, MD 06/23/17 2136    Schuyler Amor, MD 06/23/17 2153    Schuyler Amor, MD 06/23/17 2202

## 2017-06-23 NOTE — ED Notes (Signed)
Pt not in room. Pt states to other staff member that she was leaving and was seen leaving with her purse in NAD. MD made aware and spoke with family.

## 2017-06-23 NOTE — ED Triage Notes (Addendum)
Patient c/o SOB all day today. Patient denies chest pain/discomfort. Patient reports hx of asthma. Patient's lung sounds clear all fields. Patient reports increased difficulty breathing when laying flat.

## 2017-08-09 DIAGNOSIS — R0602 Shortness of breath: Secondary | ICD-10-CM | POA: Diagnosis not present

## 2017-08-09 DIAGNOSIS — R42 Dizziness and giddiness: Secondary | ICD-10-CM | POA: Diagnosis not present

## 2017-08-09 DIAGNOSIS — I700 Atherosclerosis of aorta: Secondary | ICD-10-CM | POA: Diagnosis not present

## 2017-09-01 IMAGING — CR DG CHEST 2V
2 series · 2 of 2 positions shown · non-contrast
Comparison: 07/05/2016

CLINICAL DATA: Productive cough and shortness of breath for 3-4
days.

EXAM:
CHEST  2 VIEW

[chest pa]
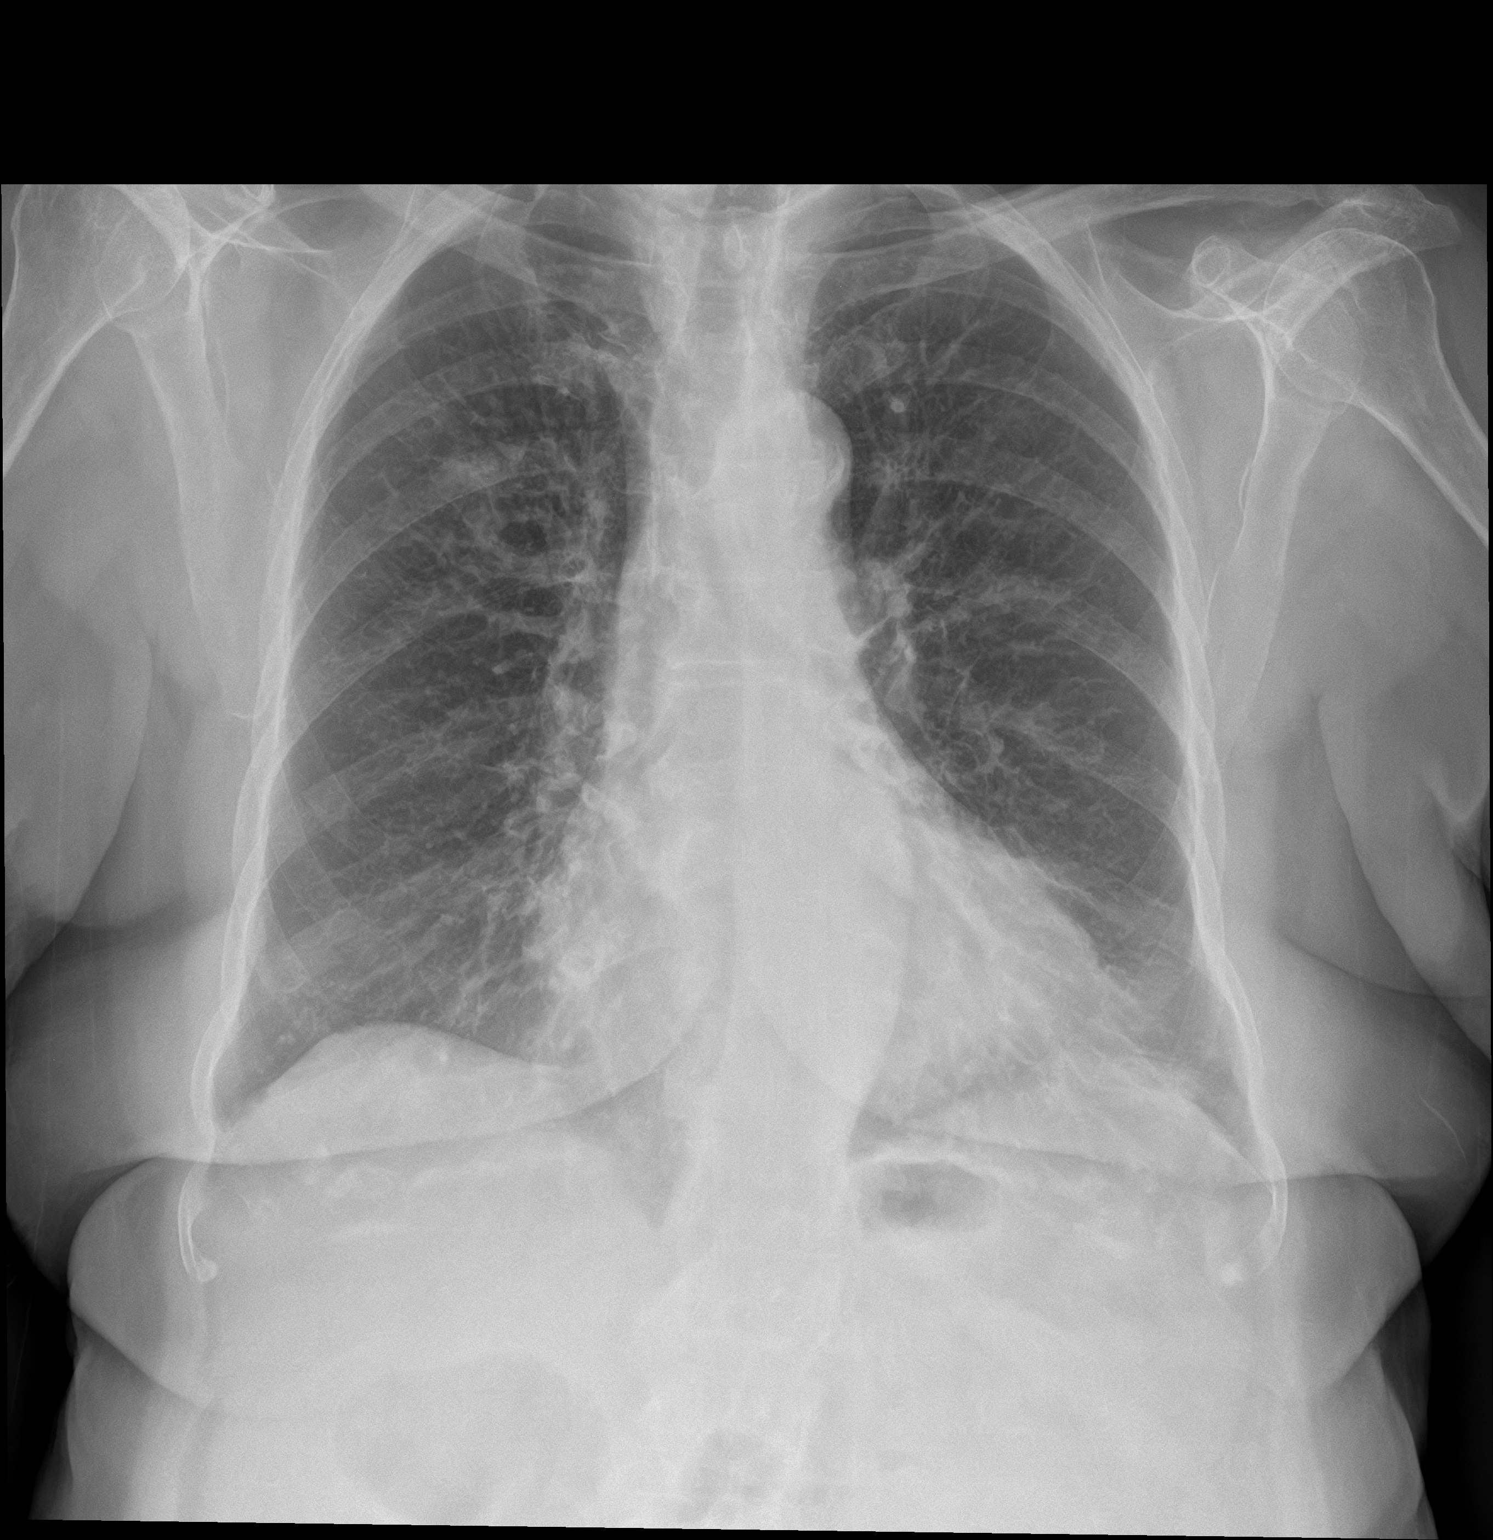

[chest lat]
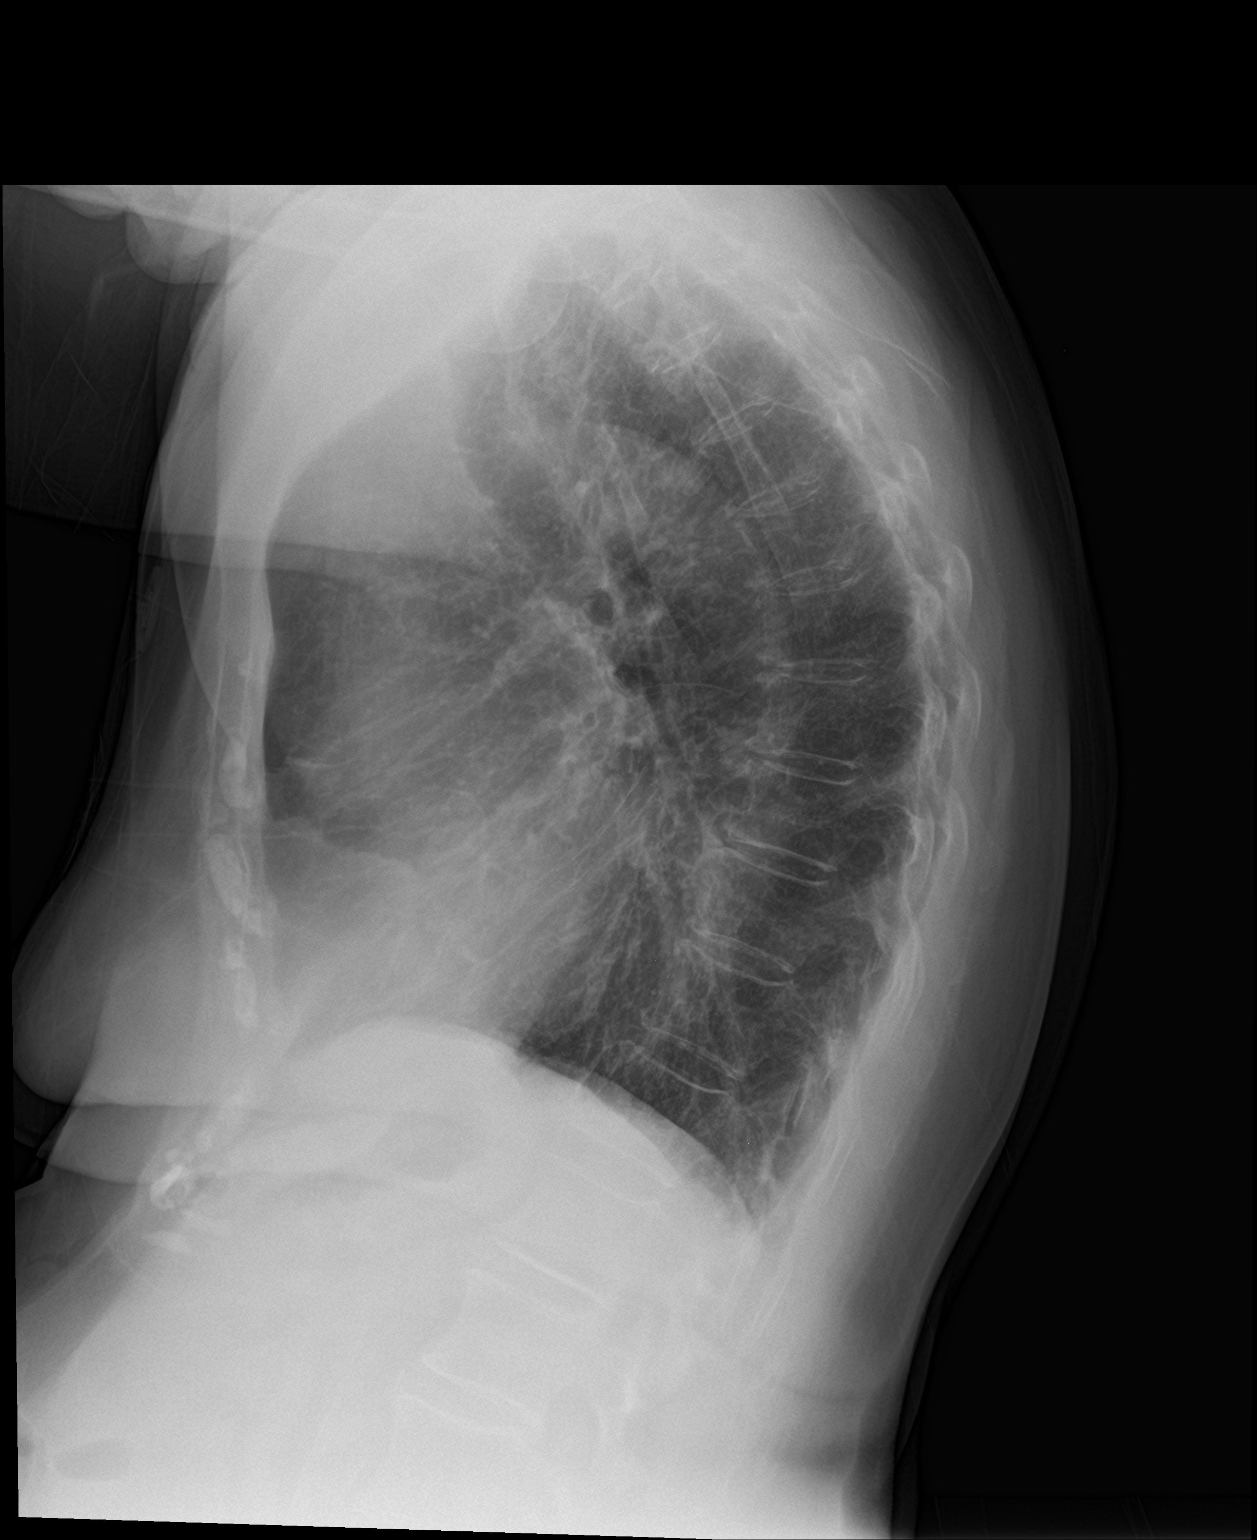

[2 of 2 positions shown; findings below may reference images not displayed]

FINDINGS: Mild bronchitic markings and mild hyperinflation. Patient has
history of asthma. Subtle increased density over the anterior right
second rib appears bony. There is no edema, consolidation, effusion,
or pneumothorax. Normal heart size. Stable mediastinal contours.
IMPRESSION: Chronic mild bronchitic markings.

## 2017-10-06 ENCOUNTER — Telehealth: Payer: Self-pay | Admitting: Internal Medicine

## 2017-10-06 NOTE — Telephone Encounter (Signed)
Patients daughter called requesting information about a medication that was given to pt and needs to get refilled. Pt moved to [place]. Was advised to contact [facility] in [place] and iif set up with new PCP tp fill out a record re

## 2017-10-08 DIAGNOSIS — H5203 Hypermetropia, bilateral: Secondary | ICD-10-CM | POA: Diagnosis not present

## 2017-10-08 DIAGNOSIS — H524 Presbyopia: Secondary | ICD-10-CM | POA: Diagnosis not present

## 2017-10-08 DIAGNOSIS — H52209 Unspecified astigmatism, unspecified eye: Secondary | ICD-10-CM | POA: Diagnosis not present

## 2017-10-11 DIAGNOSIS — I951 Orthostatic hypotension: Secondary | ICD-10-CM | POA: Diagnosis not present

## 2017-10-11 DIAGNOSIS — Z131 Encounter for screening for diabetes mellitus: Secondary | ICD-10-CM | POA: Diagnosis not present

## 2017-10-11 DIAGNOSIS — R42 Dizziness and giddiness: Secondary | ICD-10-CM | POA: Diagnosis not present

## 2017-10-11 DIAGNOSIS — E559 Vitamin D deficiency, unspecified: Secondary | ICD-10-CM | POA: Diagnosis not present

## 2017-10-11 DIAGNOSIS — L570 Actinic keratosis: Secondary | ICD-10-CM | POA: Diagnosis not present

## 2017-10-11 DIAGNOSIS — Z13220 Encounter for screening for lipoid disorders: Secondary | ICD-10-CM | POA: Diagnosis not present

## 2017-10-11 DIAGNOSIS — K5909 Other constipation: Secondary | ICD-10-CM | POA: Diagnosis not present

## 2017-10-11 DIAGNOSIS — G308 Other Alzheimer's disease: Secondary | ICD-10-CM | POA: Diagnosis not present

## 2017-10-11 DIAGNOSIS — Z7189 Other specified counseling: Secondary | ICD-10-CM | POA: Diagnosis not present

## 2017-10-11 DIAGNOSIS — R471 Dysarthria and anarthria: Secondary | ICD-10-CM | POA: Diagnosis not present

## 2017-10-11 DIAGNOSIS — R5383 Other fatigue: Secondary | ICD-10-CM | POA: Diagnosis not present

## 2017-10-11 DIAGNOSIS — R69 Illness, unspecified: Secondary | ICD-10-CM | POA: Diagnosis not present

## 2017-10-11 DIAGNOSIS — E785 Hyperlipidemia, unspecified: Secondary | ICD-10-CM | POA: Diagnosis not present

## 2017-10-11 DIAGNOSIS — E538 Deficiency of other specified B group vitamins: Secondary | ICD-10-CM | POA: Diagnosis not present

## 2017-10-13 DIAGNOSIS — I452 Bifascicular block: Secondary | ICD-10-CM | POA: Diagnosis not present

## 2017-10-13 DIAGNOSIS — J45998 Other asthma: Secondary | ICD-10-CM | POA: Diagnosis not present

## 2017-10-13 DIAGNOSIS — I951 Orthostatic hypotension: Secondary | ICD-10-CM | POA: Diagnosis not present

## 2017-10-20 DIAGNOSIS — J45998 Other asthma: Secondary | ICD-10-CM | POA: Diagnosis not present

## 2017-10-20 DIAGNOSIS — I951 Orthostatic hypotension: Secondary | ICD-10-CM | POA: Diagnosis not present

## 2017-10-20 DIAGNOSIS — I452 Bifascicular block: Secondary | ICD-10-CM | POA: Diagnosis not present

## 2017-10-25 DIAGNOSIS — I951 Orthostatic hypotension: Secondary | ICD-10-CM | POA: Diagnosis not present

## 2017-10-25 DIAGNOSIS — I452 Bifascicular block: Secondary | ICD-10-CM | POA: Diagnosis not present

## 2017-10-25 DIAGNOSIS — I351 Nonrheumatic aortic (valve) insufficiency: Secondary | ICD-10-CM | POA: Diagnosis not present

## 2017-10-25 DIAGNOSIS — J45998 Other asthma: Secondary | ICD-10-CM | POA: Diagnosis not present

## 2017-10-25 DIAGNOSIS — I868 Varicose veins of other specified sites: Secondary | ICD-10-CM | POA: Diagnosis not present

## 2017-10-26 DIAGNOSIS — G309 Alzheimer's disease, unspecified: Secondary | ICD-10-CM | POA: Diagnosis not present

## 2017-10-26 DIAGNOSIS — E7219 Other disorders of sulfur-bearing amino-acid metabolism: Secondary | ICD-10-CM | POA: Diagnosis not present

## 2017-11-11 DIAGNOSIS — G309 Alzheimer's disease, unspecified: Secondary | ICD-10-CM | POA: Diagnosis not present

## 2017-11-11 DIAGNOSIS — G308 Other Alzheimer's disease: Secondary | ICD-10-CM | POA: Diagnosis not present

## 2017-11-11 DIAGNOSIS — K5909 Other constipation: Secondary | ICD-10-CM | POA: Diagnosis not present

## 2017-11-11 DIAGNOSIS — R2241 Localized swelling, mass and lump, right lower limb: Secondary | ICD-10-CM | POA: Diagnosis not present

## 2017-11-11 DIAGNOSIS — J3089 Other allergic rhinitis: Secondary | ICD-10-CM | POA: Diagnosis not present

## 2017-11-11 DIAGNOSIS — Z024 Encounter for examination for driving license: Secondary | ICD-10-CM | POA: Diagnosis not present

## 2017-11-16 DIAGNOSIS — G300 Alzheimer's disease with early onset: Secondary | ICD-10-CM | POA: Diagnosis not present

## 2017-11-24 DIAGNOSIS — R69 Illness, unspecified: Secondary | ICD-10-CM | POA: Diagnosis not present

## 2017-12-01 DIAGNOSIS — I83892 Varicose veins of left lower extremities with other complications: Secondary | ICD-10-CM | POA: Diagnosis not present

## 2017-12-01 DIAGNOSIS — I83891 Varicose veins of right lower extremities with other complications: Secondary | ICD-10-CM | POA: Diagnosis not present

## 2017-12-07 DIAGNOSIS — R413 Other amnesia: Secondary | ICD-10-CM | POA: Diagnosis not present

## 2017-12-08 DIAGNOSIS — R2241 Localized swelling, mass and lump, right lower limb: Secondary | ICD-10-CM | POA: Diagnosis not present

## 2017-12-13 DIAGNOSIS — R413 Other amnesia: Secondary | ICD-10-CM | POA: Diagnosis not present

## 2017-12-23 DIAGNOSIS — G309 Alzheimer's disease, unspecified: Secondary | ICD-10-CM | POA: Diagnosis not present

## 2017-12-27 DIAGNOSIS — G43909 Migraine, unspecified, not intractable, without status migrainosus: Secondary | ICD-10-CM | POA: Diagnosis not present

## 2017-12-27 DIAGNOSIS — R69 Illness, unspecified: Secondary | ICD-10-CM | POA: Diagnosis not present

## 2017-12-27 DIAGNOSIS — J45909 Unspecified asthma, uncomplicated: Secondary | ICD-10-CM | POA: Diagnosis not present

## 2017-12-27 DIAGNOSIS — E785 Hyperlipidemia, unspecified: Secondary | ICD-10-CM | POA: Diagnosis not present

## 2017-12-27 DIAGNOSIS — G309 Alzheimer's disease, unspecified: Secondary | ICD-10-CM | POA: Diagnosis not present

## 2018-01-03 DIAGNOSIS — J45909 Unspecified asthma, uncomplicated: Secondary | ICD-10-CM | POA: Diagnosis not present

## 2018-01-03 DIAGNOSIS — R69 Illness, unspecified: Secondary | ICD-10-CM | POA: Diagnosis not present

## 2018-01-03 DIAGNOSIS — E785 Hyperlipidemia, unspecified: Secondary | ICD-10-CM | POA: Diagnosis not present

## 2018-01-03 DIAGNOSIS — G309 Alzheimer's disease, unspecified: Secondary | ICD-10-CM | POA: Diagnosis not present

## 2018-01-03 DIAGNOSIS — G43909 Migraine, unspecified, not intractable, without status migrainosus: Secondary | ICD-10-CM | POA: Diagnosis not present

## 2018-01-10 DIAGNOSIS — R69 Illness, unspecified: Secondary | ICD-10-CM | POA: Diagnosis not present

## 2018-01-10 DIAGNOSIS — G309 Alzheimer's disease, unspecified: Secondary | ICD-10-CM | POA: Diagnosis not present

## 2018-01-10 DIAGNOSIS — J45909 Unspecified asthma, uncomplicated: Secondary | ICD-10-CM | POA: Diagnosis not present

## 2018-01-10 DIAGNOSIS — G43909 Migraine, unspecified, not intractable, without status migrainosus: Secondary | ICD-10-CM | POA: Diagnosis not present

## 2018-01-10 DIAGNOSIS — E785 Hyperlipidemia, unspecified: Secondary | ICD-10-CM | POA: Diagnosis not present

## 2018-01-13 DIAGNOSIS — G309 Alzheimer's disease, unspecified: Secondary | ICD-10-CM | POA: Diagnosis not present

## 2018-01-14 DIAGNOSIS — R69 Illness, unspecified: Secondary | ICD-10-CM | POA: Diagnosis not present

## 2018-01-17 DIAGNOSIS — R296 Repeated falls: Secondary | ICD-10-CM | POA: Diagnosis not present

## 2018-01-17 DIAGNOSIS — R0781 Pleurodynia: Secondary | ICD-10-CM | POA: Diagnosis not present

## 2018-01-17 DIAGNOSIS — S2232XA Fracture of one rib, left side, initial encounter for closed fracture: Secondary | ICD-10-CM | POA: Diagnosis not present

## 2018-01-19 DIAGNOSIS — R69 Illness, unspecified: Secondary | ICD-10-CM | POA: Diagnosis not present

## 2018-01-19 DIAGNOSIS — J45909 Unspecified asthma, uncomplicated: Secondary | ICD-10-CM | POA: Diagnosis not present

## 2018-01-19 DIAGNOSIS — E785 Hyperlipidemia, unspecified: Secondary | ICD-10-CM | POA: Diagnosis not present

## 2018-01-19 DIAGNOSIS — G309 Alzheimer's disease, unspecified: Secondary | ICD-10-CM | POA: Diagnosis not present

## 2018-01-19 DIAGNOSIS — G43909 Migraine, unspecified, not intractable, without status migrainosus: Secondary | ICD-10-CM | POA: Diagnosis not present

## 2018-01-20 DIAGNOSIS — R69 Illness, unspecified: Secondary | ICD-10-CM | POA: Diagnosis not present

## 2018-01-20 DIAGNOSIS — G309 Alzheimer's disease, unspecified: Secondary | ICD-10-CM | POA: Diagnosis not present

## 2018-01-20 DIAGNOSIS — G43909 Migraine, unspecified, not intractable, without status migrainosus: Secondary | ICD-10-CM | POA: Diagnosis not present

## 2018-01-20 DIAGNOSIS — J45909 Unspecified asthma, uncomplicated: Secondary | ICD-10-CM | POA: Diagnosis not present

## 2018-01-20 DIAGNOSIS — E785 Hyperlipidemia, unspecified: Secondary | ICD-10-CM | POA: Diagnosis not present

## 2018-01-25 DIAGNOSIS — S27321A Contusion of lung, unilateral, initial encounter: Secondary | ICD-10-CM | POA: Diagnosis not present

## 2018-01-25 DIAGNOSIS — Z760 Encounter for issue of repeat prescription: Secondary | ICD-10-CM | POA: Diagnosis not present

## 2018-01-25 DIAGNOSIS — W010XXA Fall on same level from slipping, tripping and stumbling without subsequent striking against object, initial encounter: Secondary | ICD-10-CM | POA: Diagnosis not present

## 2018-01-25 DIAGNOSIS — G43909 Migraine, unspecified, not intractable, without status migrainosus: Secondary | ICD-10-CM | POA: Diagnosis not present

## 2018-01-25 DIAGNOSIS — I10 Essential (primary) hypertension: Secondary | ICD-10-CM | POA: Diagnosis not present

## 2018-01-25 DIAGNOSIS — G309 Alzheimer's disease, unspecified: Secondary | ICD-10-CM | POA: Diagnosis not present

## 2018-01-25 DIAGNOSIS — J45909 Unspecified asthma, uncomplicated: Secondary | ICD-10-CM | POA: Diagnosis not present

## 2018-01-25 DIAGNOSIS — R69 Illness, unspecified: Secondary | ICD-10-CM | POA: Diagnosis not present

## 2018-01-25 DIAGNOSIS — M899 Disorder of bone, unspecified: Secondary | ICD-10-CM | POA: Diagnosis not present

## 2018-01-25 DIAGNOSIS — S2242XA Multiple fractures of ribs, left side, initial encounter for closed fracture: Secondary | ICD-10-CM | POA: Diagnosis not present

## 2018-01-25 DIAGNOSIS — W19XXXA Unspecified fall, initial encounter: Secondary | ICD-10-CM | POA: Diagnosis not present

## 2018-01-25 DIAGNOSIS — G20 Parkinson's disease: Secondary | ICD-10-CM | POA: Diagnosis not present

## 2018-01-25 DIAGNOSIS — E785 Hyperlipidemia, unspecified: Secondary | ICD-10-CM | POA: Diagnosis not present

## 2018-01-26 DIAGNOSIS — G309 Alzheimer's disease, unspecified: Secondary | ICD-10-CM | POA: Diagnosis not present

## 2018-01-26 DIAGNOSIS — J45909 Unspecified asthma, uncomplicated: Secondary | ICD-10-CM | POA: Diagnosis not present

## 2018-01-26 DIAGNOSIS — R69 Illness, unspecified: Secondary | ICD-10-CM | POA: Diagnosis not present

## 2018-01-26 DIAGNOSIS — G43909 Migraine, unspecified, not intractable, without status migrainosus: Secondary | ICD-10-CM | POA: Diagnosis not present

## 2018-01-26 DIAGNOSIS — E785 Hyperlipidemia, unspecified: Secondary | ICD-10-CM | POA: Diagnosis not present

## 2018-01-27 DIAGNOSIS — E785 Hyperlipidemia, unspecified: Secondary | ICD-10-CM | POA: Diagnosis not present

## 2018-01-27 DIAGNOSIS — J45909 Unspecified asthma, uncomplicated: Secondary | ICD-10-CM | POA: Diagnosis not present

## 2018-01-27 DIAGNOSIS — R69 Illness, unspecified: Secondary | ICD-10-CM | POA: Diagnosis not present

## 2018-01-27 DIAGNOSIS — G43909 Migraine, unspecified, not intractable, without status migrainosus: Secondary | ICD-10-CM | POA: Diagnosis not present

## 2018-01-27 DIAGNOSIS — G309 Alzheimer's disease, unspecified: Secondary | ICD-10-CM | POA: Diagnosis not present

## 2018-01-30 DIAGNOSIS — J45909 Unspecified asthma, uncomplicated: Secondary | ICD-10-CM | POA: Diagnosis not present

## 2018-01-30 DIAGNOSIS — G309 Alzheimer's disease, unspecified: Secondary | ICD-10-CM | POA: Diagnosis not present

## 2018-01-30 DIAGNOSIS — R69 Illness, unspecified: Secondary | ICD-10-CM | POA: Diagnosis not present

## 2018-01-30 DIAGNOSIS — E785 Hyperlipidemia, unspecified: Secondary | ICD-10-CM | POA: Diagnosis not present

## 2018-01-30 DIAGNOSIS — S2242XD Multiple fractures of ribs, left side, subsequent encounter for fracture with routine healing: Secondary | ICD-10-CM | POA: Diagnosis not present

## 2018-01-30 DIAGNOSIS — K5909 Other constipation: Secondary | ICD-10-CM | POA: Diagnosis not present

## 2018-01-30 DIAGNOSIS — J4540 Moderate persistent asthma, uncomplicated: Secondary | ICD-10-CM | POA: Diagnosis not present

## 2018-01-30 DIAGNOSIS — S27321D Contusion of lung, unilateral, subsequent encounter: Secondary | ICD-10-CM | POA: Diagnosis not present

## 2018-01-30 DIAGNOSIS — G43909 Migraine, unspecified, not intractable, without status migrainosus: Secondary | ICD-10-CM | POA: Diagnosis not present

## 2018-02-01 DIAGNOSIS — R69 Illness, unspecified: Secondary | ICD-10-CM | POA: Diagnosis not present

## 2018-02-01 DIAGNOSIS — J45909 Unspecified asthma, uncomplicated: Secondary | ICD-10-CM | POA: Diagnosis not present

## 2018-02-01 DIAGNOSIS — G43909 Migraine, unspecified, not intractable, without status migrainosus: Secondary | ICD-10-CM | POA: Diagnosis not present

## 2018-02-01 DIAGNOSIS — E785 Hyperlipidemia, unspecified: Secondary | ICD-10-CM | POA: Diagnosis not present

## 2018-02-01 DIAGNOSIS — G309 Alzheimer's disease, unspecified: Secondary | ICD-10-CM | POA: Diagnosis not present

## 2018-02-06 DIAGNOSIS — G309 Alzheimer's disease, unspecified: Secondary | ICD-10-CM | POA: Diagnosis not present

## 2018-02-06 DIAGNOSIS — J45909 Unspecified asthma, uncomplicated: Secondary | ICD-10-CM | POA: Diagnosis not present

## 2018-02-06 DIAGNOSIS — G43909 Migraine, unspecified, not intractable, without status migrainosus: Secondary | ICD-10-CM | POA: Diagnosis not present

## 2018-02-06 DIAGNOSIS — E785 Hyperlipidemia, unspecified: Secondary | ICD-10-CM | POA: Diagnosis not present

## 2018-02-06 DIAGNOSIS — R69 Illness, unspecified: Secondary | ICD-10-CM | POA: Diagnosis not present

## 2018-02-08 DIAGNOSIS — E785 Hyperlipidemia, unspecified: Secondary | ICD-10-CM | POA: Diagnosis not present

## 2018-02-08 DIAGNOSIS — R69 Illness, unspecified: Secondary | ICD-10-CM | POA: Diagnosis not present

## 2018-02-08 DIAGNOSIS — G309 Alzheimer's disease, unspecified: Secondary | ICD-10-CM | POA: Diagnosis not present

## 2018-02-08 DIAGNOSIS — J45909 Unspecified asthma, uncomplicated: Secondary | ICD-10-CM | POA: Diagnosis not present

## 2018-02-08 DIAGNOSIS — G43909 Migraine, unspecified, not intractable, without status migrainosus: Secondary | ICD-10-CM | POA: Diagnosis not present

## 2018-02-09 DIAGNOSIS — G43909 Migraine, unspecified, not intractable, without status migrainosus: Secondary | ICD-10-CM | POA: Diagnosis not present

## 2018-02-09 DIAGNOSIS — E785 Hyperlipidemia, unspecified: Secondary | ICD-10-CM | POA: Diagnosis not present

## 2018-02-09 DIAGNOSIS — J45909 Unspecified asthma, uncomplicated: Secondary | ICD-10-CM | POA: Diagnosis not present

## 2018-02-09 DIAGNOSIS — R69 Illness, unspecified: Secondary | ICD-10-CM | POA: Diagnosis not present

## 2018-02-09 DIAGNOSIS — G309 Alzheimer's disease, unspecified: Secondary | ICD-10-CM | POA: Diagnosis not present

## 2018-02-10 DIAGNOSIS — J45909 Unspecified asthma, uncomplicated: Secondary | ICD-10-CM | POA: Diagnosis not present

## 2018-02-10 DIAGNOSIS — G309 Alzheimer's disease, unspecified: Secondary | ICD-10-CM | POA: Diagnosis not present

## 2018-02-10 DIAGNOSIS — G43909 Migraine, unspecified, not intractable, without status migrainosus: Secondary | ICD-10-CM | POA: Diagnosis not present

## 2018-02-10 DIAGNOSIS — E785 Hyperlipidemia, unspecified: Secondary | ICD-10-CM | POA: Diagnosis not present

## 2018-02-10 DIAGNOSIS — R69 Illness, unspecified: Secondary | ICD-10-CM | POA: Diagnosis not present

## 2018-02-13 DIAGNOSIS — G309 Alzheimer's disease, unspecified: Secondary | ICD-10-CM | POA: Diagnosis not present

## 2018-02-13 DIAGNOSIS — G43909 Migraine, unspecified, not intractable, without status migrainosus: Secondary | ICD-10-CM | POA: Diagnosis not present

## 2018-02-13 DIAGNOSIS — R69 Illness, unspecified: Secondary | ICD-10-CM | POA: Diagnosis not present

## 2018-02-13 DIAGNOSIS — E785 Hyperlipidemia, unspecified: Secondary | ICD-10-CM | POA: Diagnosis not present

## 2018-02-13 DIAGNOSIS — J45909 Unspecified asthma, uncomplicated: Secondary | ICD-10-CM | POA: Diagnosis not present

## 2018-02-14 DIAGNOSIS — R69 Illness, unspecified: Secondary | ICD-10-CM | POA: Diagnosis not present

## 2018-02-14 DIAGNOSIS — E785 Hyperlipidemia, unspecified: Secondary | ICD-10-CM | POA: Diagnosis not present

## 2018-02-14 DIAGNOSIS — J45909 Unspecified asthma, uncomplicated: Secondary | ICD-10-CM | POA: Diagnosis not present

## 2018-02-14 DIAGNOSIS — G309 Alzheimer's disease, unspecified: Secondary | ICD-10-CM | POA: Diagnosis not present

## 2018-02-14 DIAGNOSIS — G43909 Migraine, unspecified, not intractable, without status migrainosus: Secondary | ICD-10-CM | POA: Diagnosis not present

## 2018-02-15 DIAGNOSIS — R630 Anorexia: Secondary | ICD-10-CM | POA: Diagnosis not present

## 2018-02-15 DIAGNOSIS — R2241 Localized swelling, mass and lump, right lower limb: Secondary | ICD-10-CM | POA: Diagnosis not present

## 2018-02-15 DIAGNOSIS — K5900 Constipation, unspecified: Secondary | ICD-10-CM | POA: Diagnosis not present

## 2018-02-15 DIAGNOSIS — R262 Difficulty in walking, not elsewhere classified: Secondary | ICD-10-CM | POA: Diagnosis not present

## 2018-02-15 DIAGNOSIS — K5909 Other constipation: Secondary | ICD-10-CM | POA: Diagnosis not present

## 2018-02-15 DIAGNOSIS — G308 Other Alzheimer's disease: Secondary | ICD-10-CM | POA: Diagnosis not present

## 2018-02-15 DIAGNOSIS — R634 Abnormal weight loss: Secondary | ICD-10-CM | POA: Diagnosis not present

## 2018-02-15 DIAGNOSIS — M6281 Muscle weakness (generalized): Secondary | ICD-10-CM | POA: Diagnosis not present

## 2018-02-15 DIAGNOSIS — Z7189 Other specified counseling: Secondary | ICD-10-CM | POA: Diagnosis not present

## 2018-02-16 DIAGNOSIS — R69 Illness, unspecified: Secondary | ICD-10-CM | POA: Diagnosis not present

## 2018-02-16 DIAGNOSIS — G309 Alzheimer's disease, unspecified: Secondary | ICD-10-CM | POA: Diagnosis not present

## 2018-02-16 DIAGNOSIS — J45909 Unspecified asthma, uncomplicated: Secondary | ICD-10-CM | POA: Diagnosis not present

## 2018-02-16 DIAGNOSIS — E785 Hyperlipidemia, unspecified: Secondary | ICD-10-CM | POA: Diagnosis not present

## 2018-02-16 DIAGNOSIS — G43909 Migraine, unspecified, not intractable, without status migrainosus: Secondary | ICD-10-CM | POA: Diagnosis not present

## 2018-02-17 DIAGNOSIS — G309 Alzheimer's disease, unspecified: Secondary | ICD-10-CM | POA: Diagnosis not present

## 2018-02-17 DIAGNOSIS — R69 Illness, unspecified: Secondary | ICD-10-CM | POA: Diagnosis not present

## 2018-02-17 DIAGNOSIS — G43909 Migraine, unspecified, not intractable, without status migrainosus: Secondary | ICD-10-CM | POA: Diagnosis not present

## 2018-02-17 DIAGNOSIS — E785 Hyperlipidemia, unspecified: Secondary | ICD-10-CM | POA: Diagnosis not present

## 2018-02-17 DIAGNOSIS — J45909 Unspecified asthma, uncomplicated: Secondary | ICD-10-CM | POA: Diagnosis not present

## 2018-02-20 DIAGNOSIS — G43909 Migraine, unspecified, not intractable, without status migrainosus: Secondary | ICD-10-CM | POA: Diagnosis not present

## 2018-02-20 DIAGNOSIS — G309 Alzheimer's disease, unspecified: Secondary | ICD-10-CM | POA: Diagnosis not present

## 2018-02-20 DIAGNOSIS — E785 Hyperlipidemia, unspecified: Secondary | ICD-10-CM | POA: Diagnosis not present

## 2018-02-20 DIAGNOSIS — R69 Illness, unspecified: Secondary | ICD-10-CM | POA: Diagnosis not present

## 2018-02-20 DIAGNOSIS — J45909 Unspecified asthma, uncomplicated: Secondary | ICD-10-CM | POA: Diagnosis not present

## 2018-02-21 DIAGNOSIS — G309 Alzheimer's disease, unspecified: Secondary | ICD-10-CM | POA: Diagnosis not present

## 2018-02-21 DIAGNOSIS — G43909 Migraine, unspecified, not intractable, without status migrainosus: Secondary | ICD-10-CM | POA: Diagnosis not present

## 2018-02-21 DIAGNOSIS — J45909 Unspecified asthma, uncomplicated: Secondary | ICD-10-CM | POA: Diagnosis not present

## 2018-02-21 DIAGNOSIS — E785 Hyperlipidemia, unspecified: Secondary | ICD-10-CM | POA: Diagnosis not present

## 2018-02-21 DIAGNOSIS — R69 Illness, unspecified: Secondary | ICD-10-CM | POA: Diagnosis not present

## 2018-02-22 DIAGNOSIS — R69 Illness, unspecified: Secondary | ICD-10-CM | POA: Diagnosis not present

## 2018-02-22 DIAGNOSIS — E785 Hyperlipidemia, unspecified: Secondary | ICD-10-CM | POA: Diagnosis not present

## 2018-02-22 DIAGNOSIS — G43909 Migraine, unspecified, not intractable, without status migrainosus: Secondary | ICD-10-CM | POA: Diagnosis not present

## 2018-02-22 DIAGNOSIS — G309 Alzheimer's disease, unspecified: Secondary | ICD-10-CM | POA: Diagnosis not present

## 2018-02-22 DIAGNOSIS — J45909 Unspecified asthma, uncomplicated: Secondary | ICD-10-CM | POA: Diagnosis not present

## 2020-05-06 DEATH — deceased
